# Patient Record
Sex: Female | Born: 1960 | Race: White | Hispanic: No | Smoking: Never smoker
Health system: Southern US, Community
[De-identification: ages and names within clinical notes are randomized; demographics above are authoritative.]

## PROBLEM LIST (undated history)

## (undated) DIAGNOSIS — E785 Hyperlipidemia, unspecified: Secondary | ICD-10-CM

## (undated) DIAGNOSIS — G4730 Sleep apnea, unspecified: Secondary | ICD-10-CM

## (undated) DIAGNOSIS — N870 Mild cervical dysplasia: Secondary | ICD-10-CM

## (undated) DIAGNOSIS — S92902A Unspecified fracture of left foot, initial encounter for closed fracture: Secondary | ICD-10-CM

## (undated) DIAGNOSIS — B977 Papillomavirus as the cause of diseases classified elsewhere: Secondary | ICD-10-CM

## (undated) DIAGNOSIS — I10 Essential (primary) hypertension: Secondary | ICD-10-CM

## (undated) HISTORY — DX: Hyperlipidemia, unspecified: E78.5

## (undated) HISTORY — DX: Essential (primary) hypertension: I10

## (undated) HISTORY — PX: TONSILLECTOMY: SUR1361

## (undated) HISTORY — DX: Unspecified fracture of left foot, initial encounter for closed fracture: S92.902A

## (undated) HISTORY — DX: Sleep apnea, unspecified: G47.30

## (undated) HISTORY — DX: Mild cervical dysplasia: N87.0

## (undated) HISTORY — PX: OTHER SURGICAL HISTORY: SHX169

## (undated) HISTORY — DX: Papillomavirus as the cause of diseases classified elsewhere: B97.7

## (undated) HISTORY — PX: BREAST BIOPSY: SHX20

---

## 2003-05-30 ENCOUNTER — Ambulatory Visit (HOSPITAL_BASED_OUTPATIENT_CLINIC_OR_DEPARTMENT_OTHER): Admission: RE | Admit: 2003-05-30 | Discharge: 2003-05-30 | Payer: Self-pay | Admitting: Orthopedic Surgery

## 2004-09-18 ENCOUNTER — Ambulatory Visit: Payer: Self-pay | Admitting: Unknown Physician Specialty

## 2006-01-07 ENCOUNTER — Ambulatory Visit: Payer: Self-pay | Admitting: Unknown Physician Specialty

## 2006-01-07 IMAGING — MG UNKNOWN MG STUDY
1 series · 4 of 4 positions shown · non-contrast
Comparison: none

REASON FOR EXAM: screening mammo
COMMENTS:

[R CC · right · 4 of 4 slices shown]
[im 1/4]
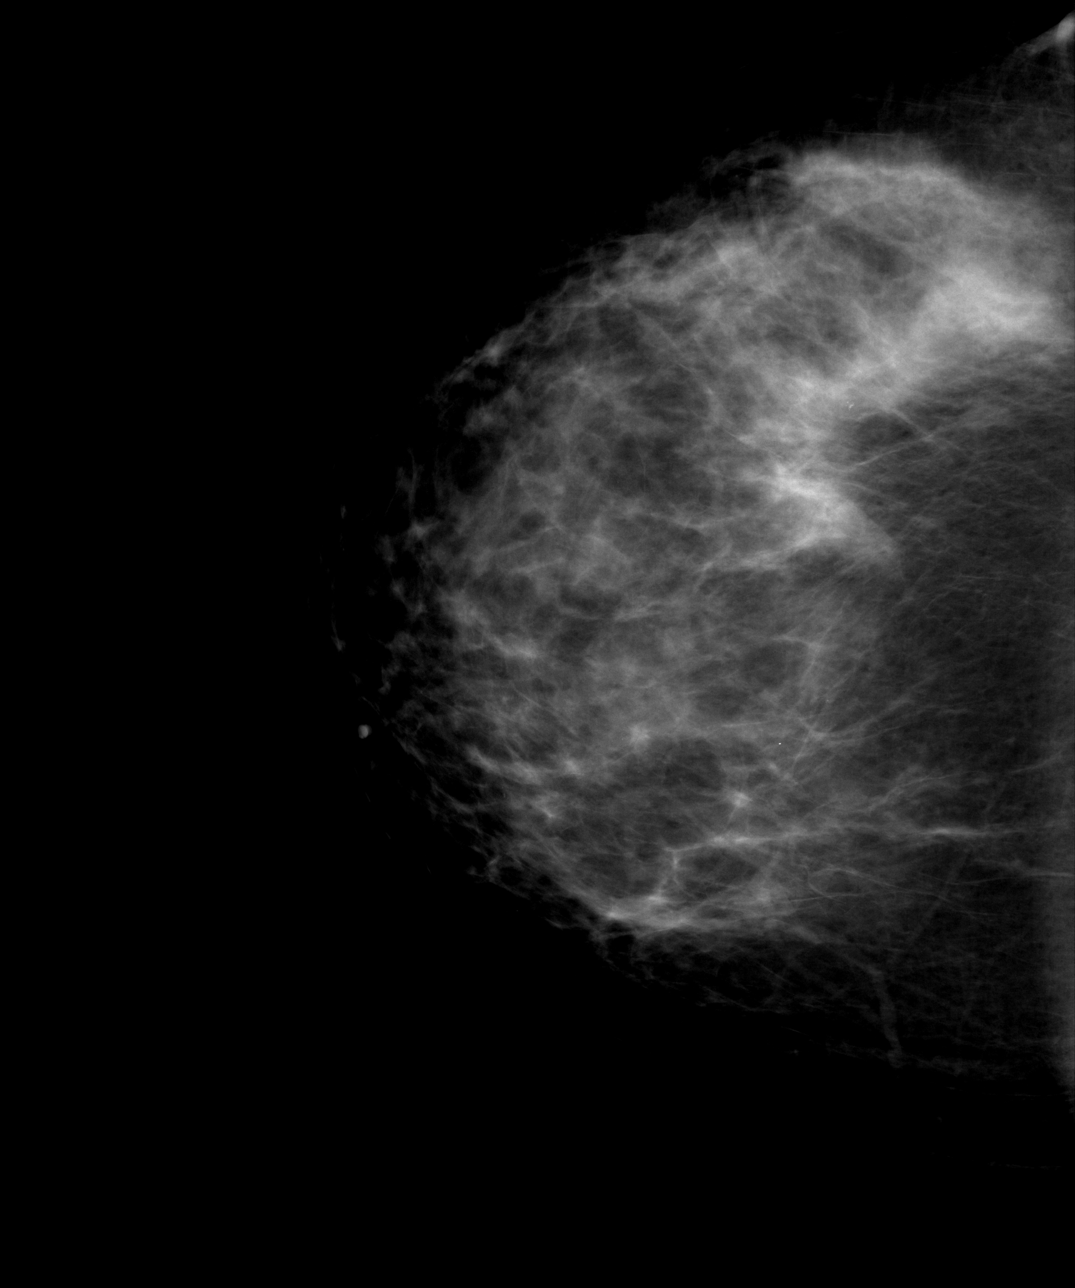
[im 2/4]
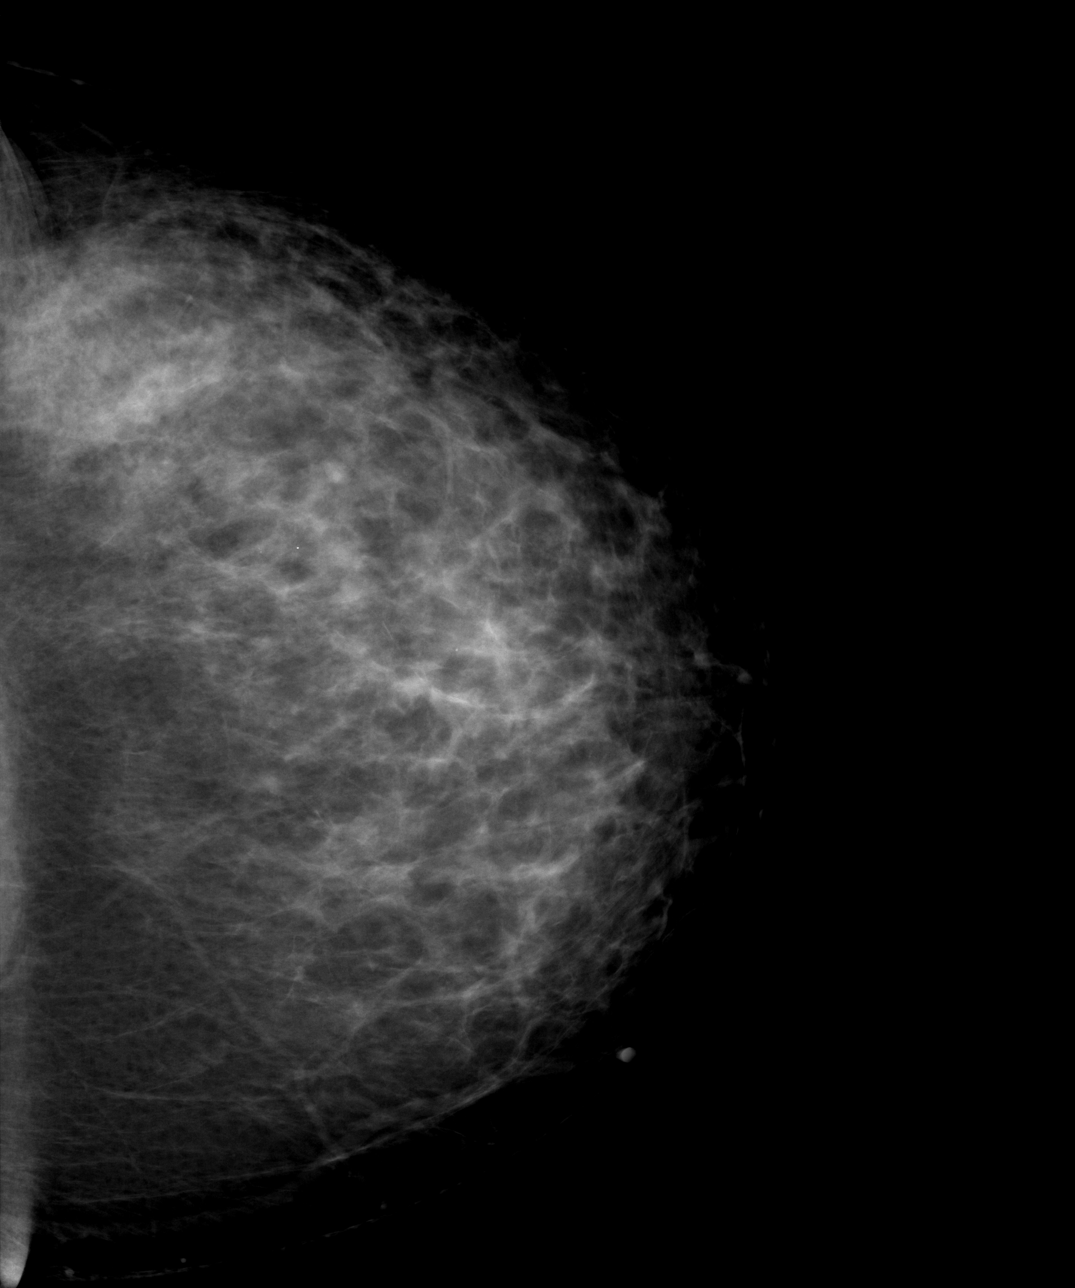
[im 3/4]
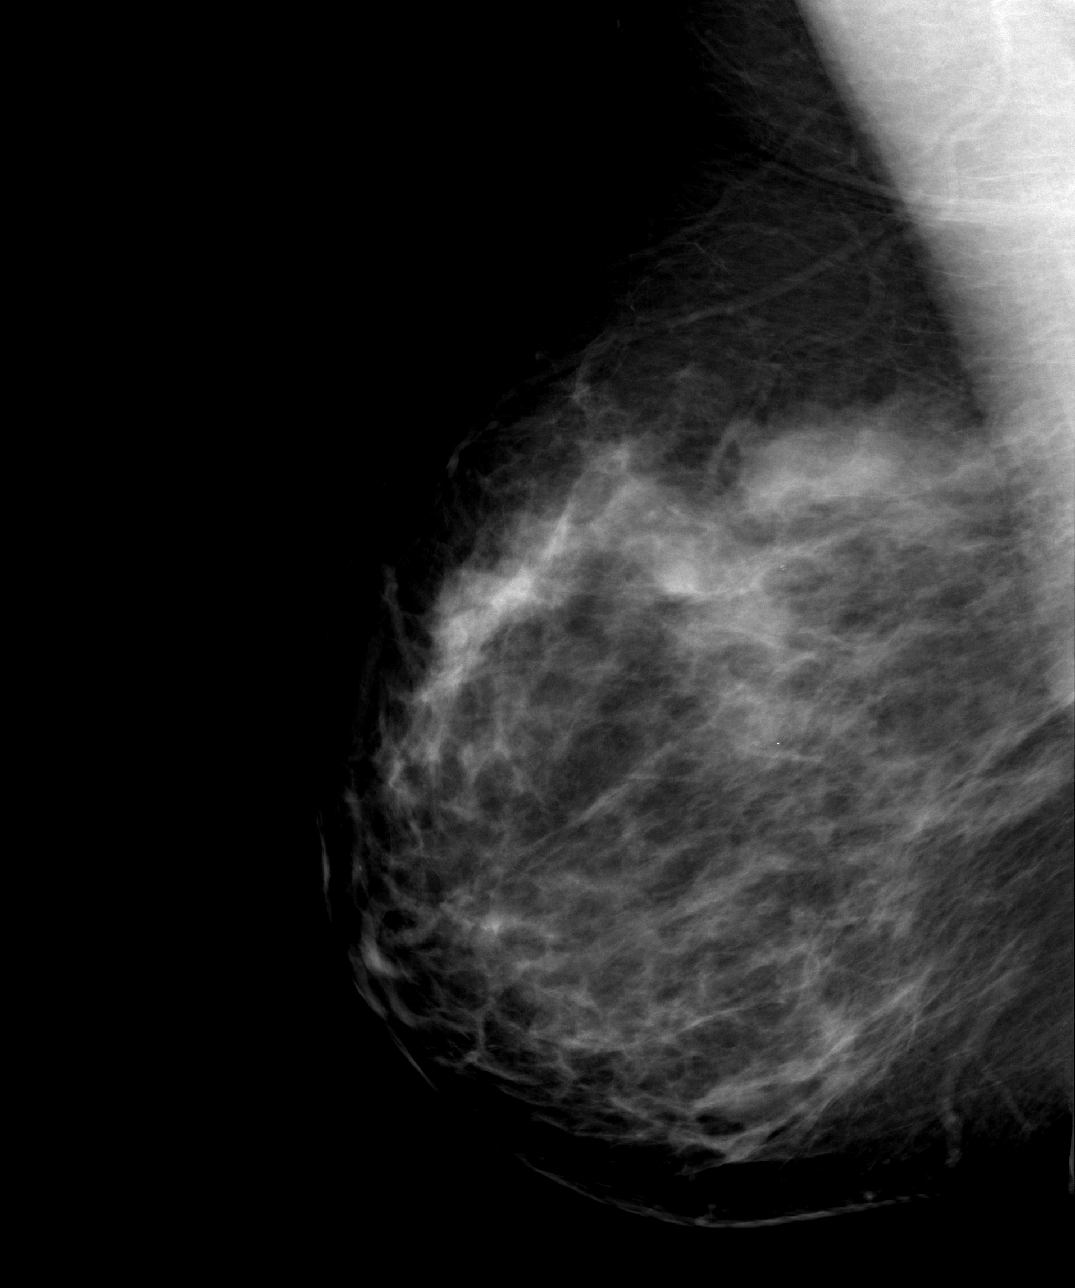
[im 4/4]
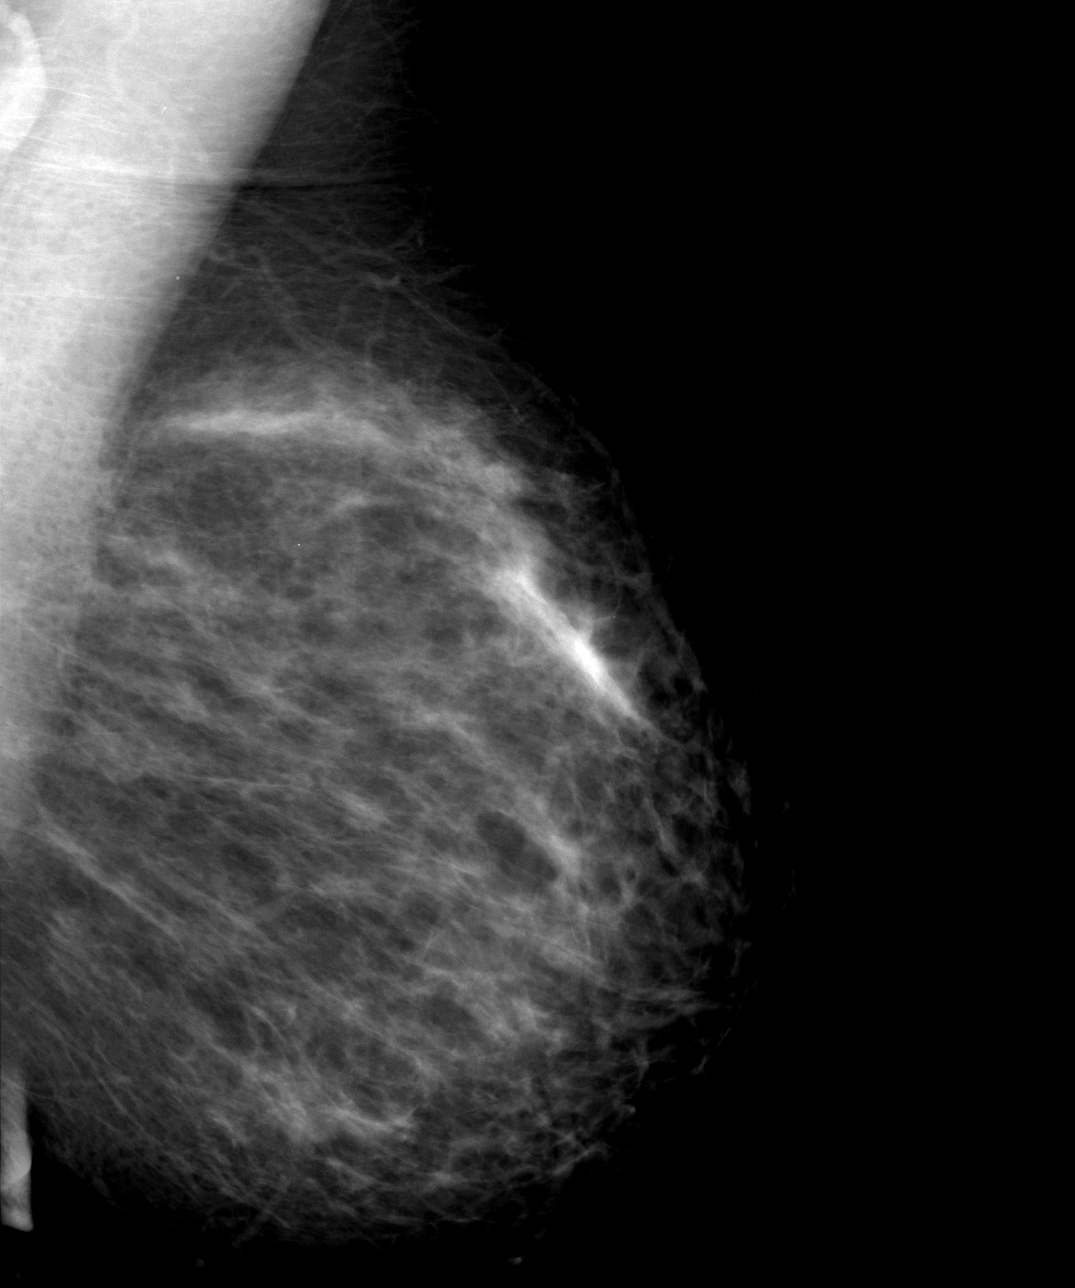

[4 of 4 positions shown; findings below may reference images not displayed]

PROCEDURE:     MAM - MAM DGTL SCREENING MAMMO W/CAD  - [DATE]  [DATE]

RESULT:     Routine imaging was obtained and compared with prior film
screening of [DATE] and [DATE].  The breasts are moderately dense and
nodular with no dominant mass seen. No suspicious microcalcifications are
noted. No areas of architectural distortion or developing densities.
IMPRESSION: 1)Nodular breasts which appear basically unchanged from prior studies.
Continued annual follow-up is recommended.

BI-RADS: Category 2-Benign Finding

A NEGATIVE MAMMOGRAM REPORT DOES NOT PRECLUDE BIOPSY OR OTHER EVALUATION OF
A CLINICALLY PALPABLE OR OTHERWISE SUSPICIOUS MASS OR LESION.  BREAST CANCER
MAY NOT BE DETECTED BY MAMMOGRAPHY IN UP TO 10% OF CASES.

## 2007-08-04 ENCOUNTER — Ambulatory Visit: Payer: Self-pay | Admitting: Unknown Physician Specialty

## 2007-08-04 IMAGING — MG MAM DGTL SCREENING MAMMO W/CAD
1 series · 5 of 5 positions shown · non-contrast
Comparison: none

REASON FOR EXAM: screening mammo
COMMENTS:

[Series 3858: R CC · right · 5 of 5 slices shown]
[im 1/5]
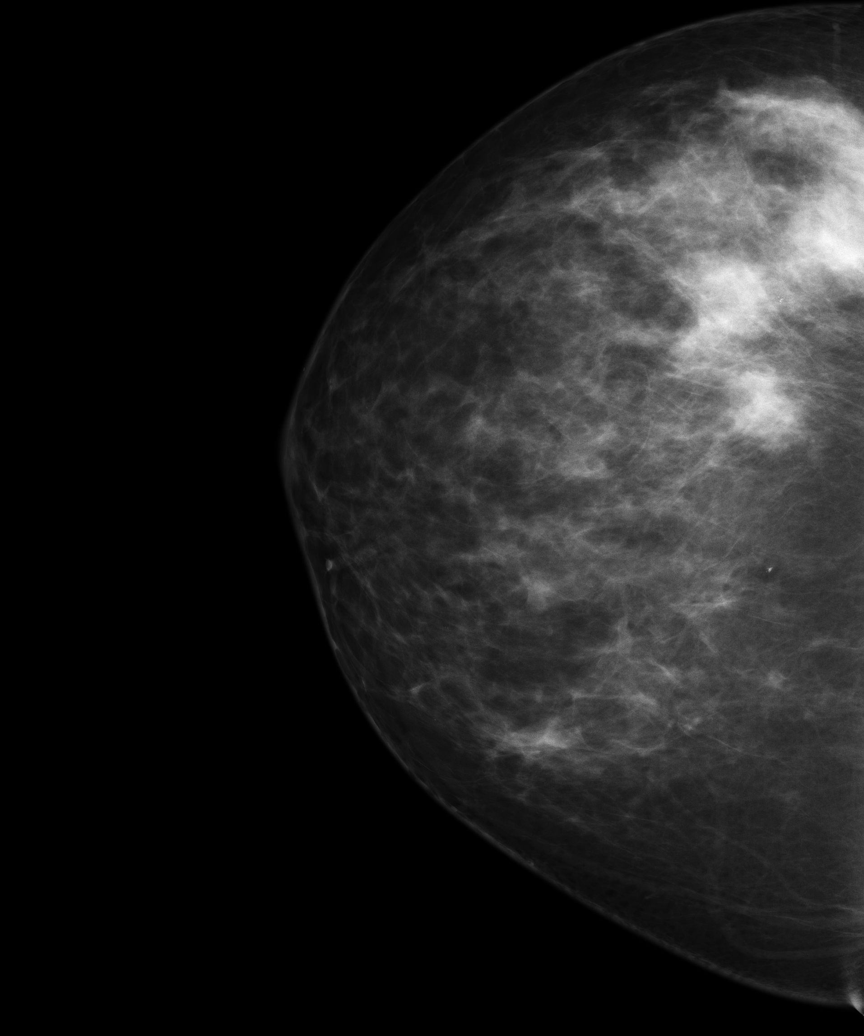
[im 2/5]
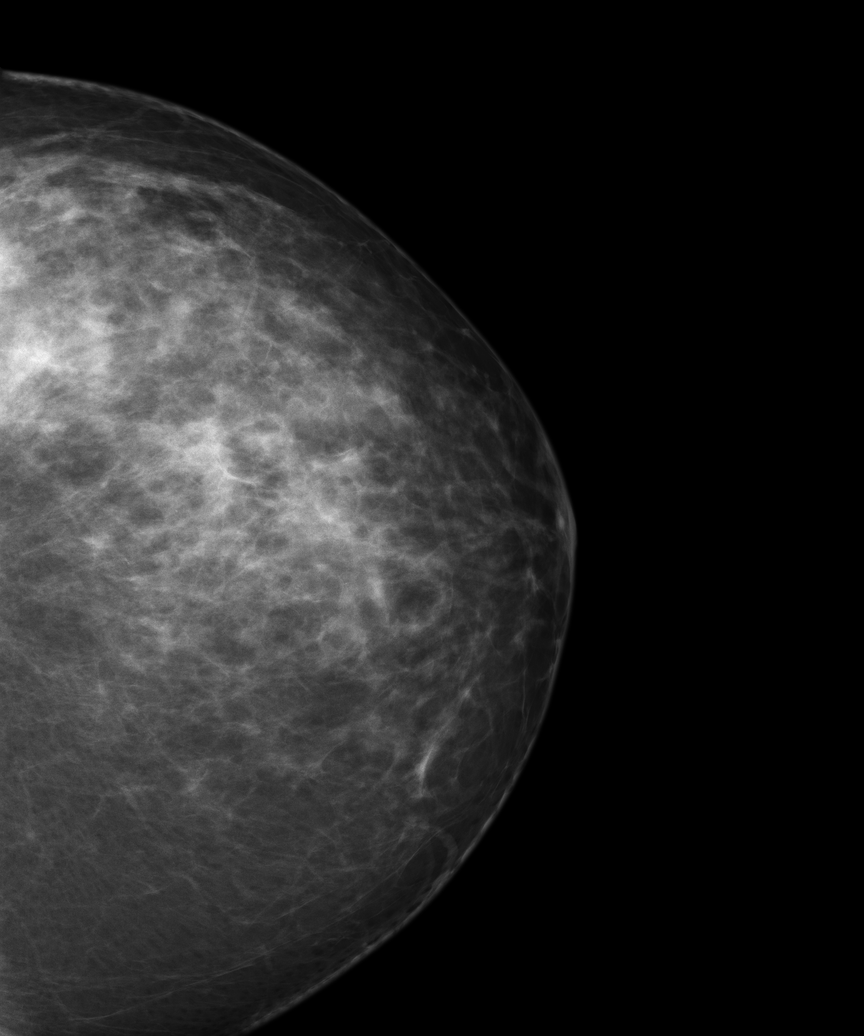
[im 3/5]
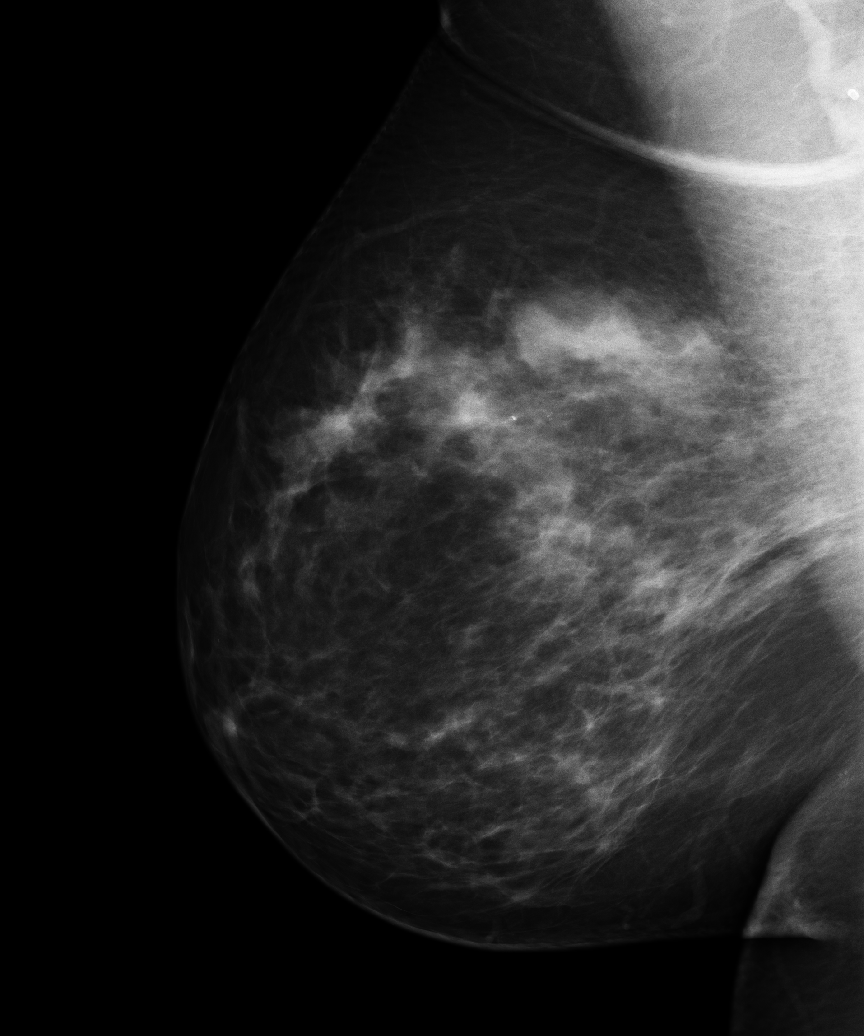
[im 4/5]
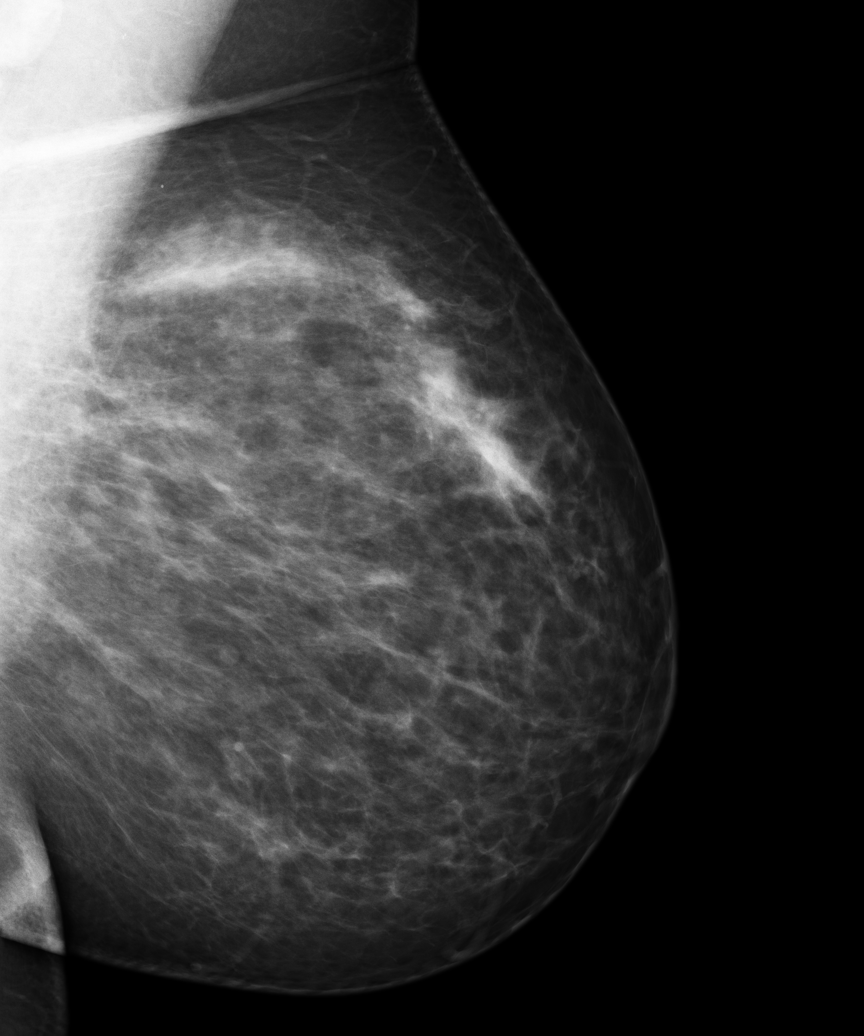
[im 5/5]
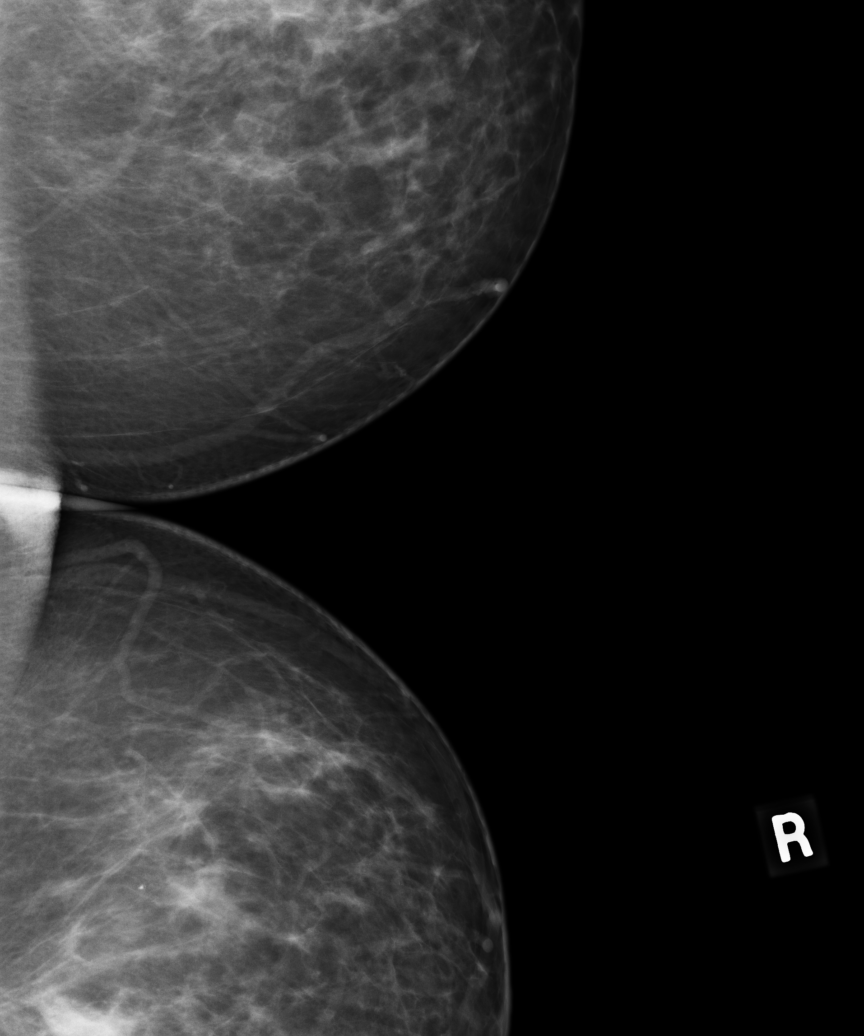

[5 of 5 positions shown; findings below may reference images not displayed]

PROCEDURE:     MAM - MAM DGTL SCREENING MAMMO W/CAD  - [DATE]  [DATE]

RESULT:     Comparison is made to prior digital study of [DATE] as well
as to a film screen study of [DATE].
There is a stable, dominant density noted on the RIGHT in the upper outer
quadrant.  There is no dominant mass noted elsewhere.  There are no
malignant-appearing groupings of microcalcification.  No area of new
architectural distortion is seen.
IMPRESSION: I do not see findings suspicious for malignancy.

BI-RADS:  Category 2  Benign Finding.

RECOMMENDATION:  Please continue to encourage yearly mammographic follow up.

A NEGATIVE MAMMOGRAM REPORT DOES NOT PRECLUDE BIOPSY OR OTHER EVALUATION OF
A CLINICALLY PALPABLE OR OTHERWISE SUSPICIOUS MASS OR LESION.  BREAST CANCER
MAY NOT BE DETECTED BY MAMMOGRAPHY IN UP TO 10% OF CASES.

## 2009-07-09 ENCOUNTER — Ambulatory Visit: Payer: Self-pay | Admitting: Unknown Physician Specialty

## 2011-07-27 ENCOUNTER — Ambulatory Visit: Payer: Self-pay | Admitting: Unknown Physician Specialty

## 2011-07-27 IMAGING — MG MM DIGITAL SCREENING BILAT W/ CAD
1 series · 5 of 5 positions shown · non-contrast
Comparison: none

REASON FOR EXAM: DON LOLITO scr mammo no order
COMMENTS:

[R CC · right · 5 of 5 slices shown]
[im 1/5]
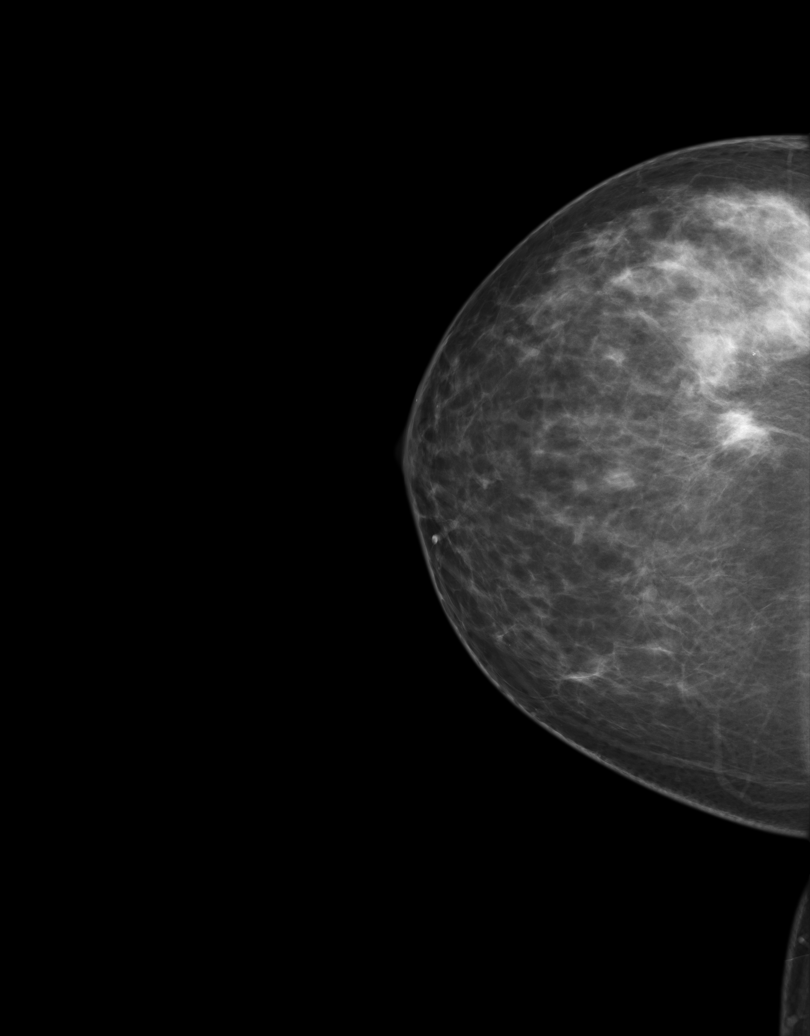
[im 2/5]
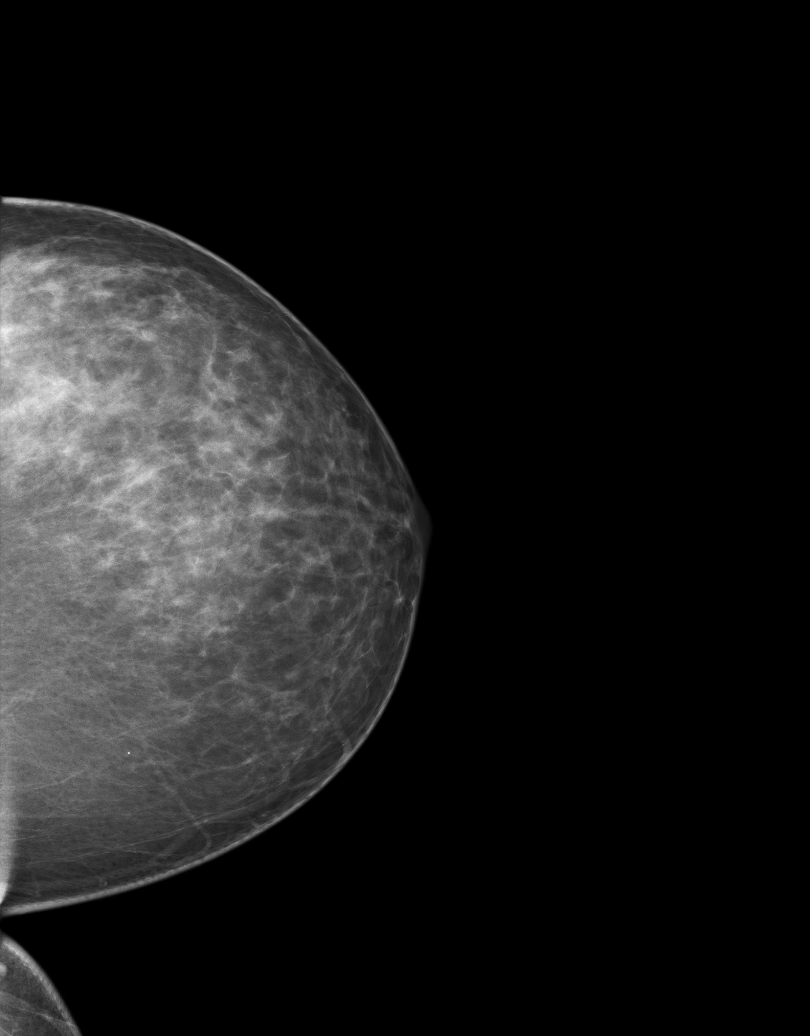
[im 3/5]
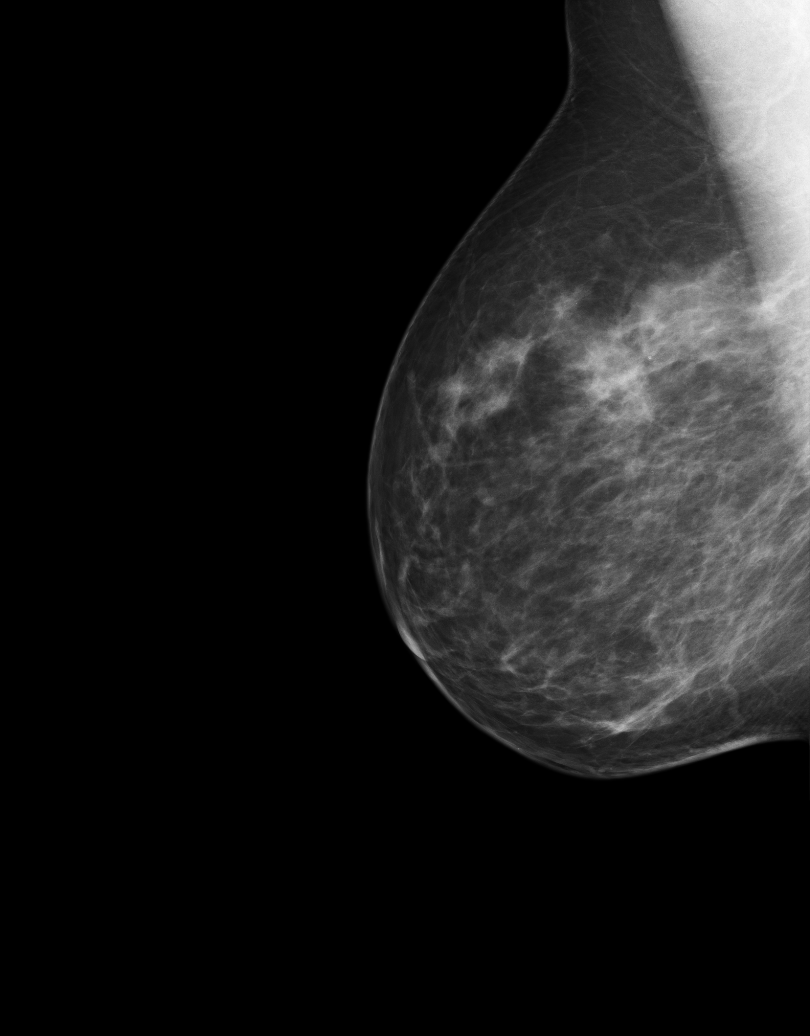
[im 4/5]
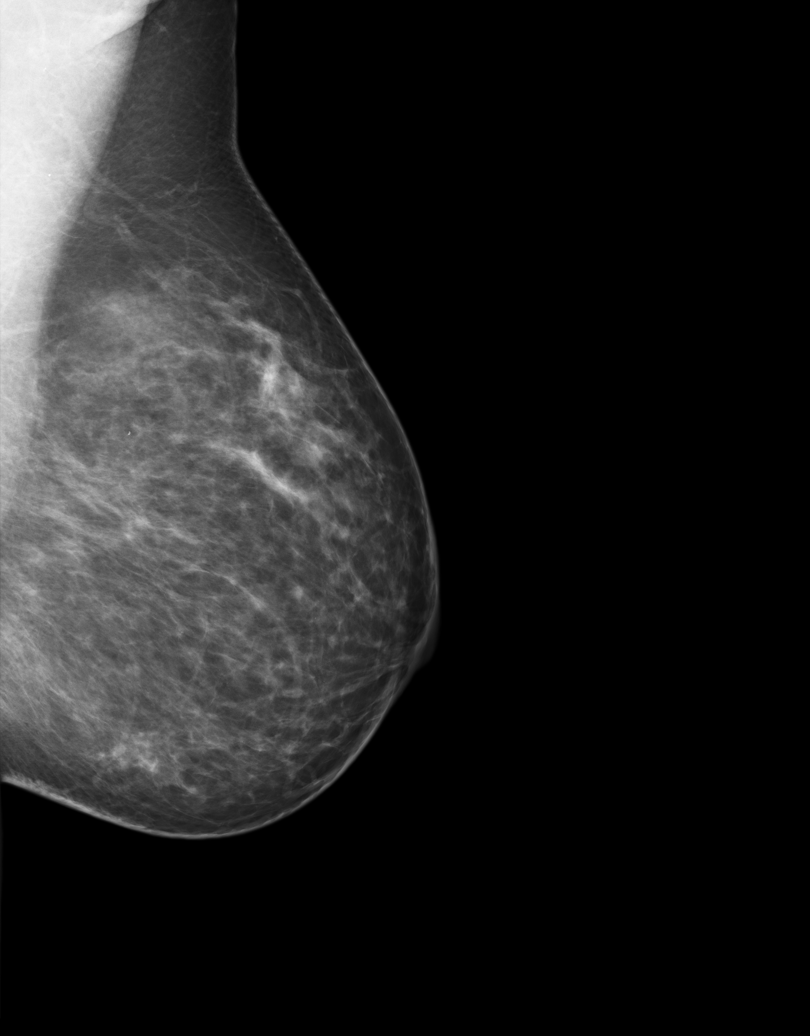
[im 5/5]
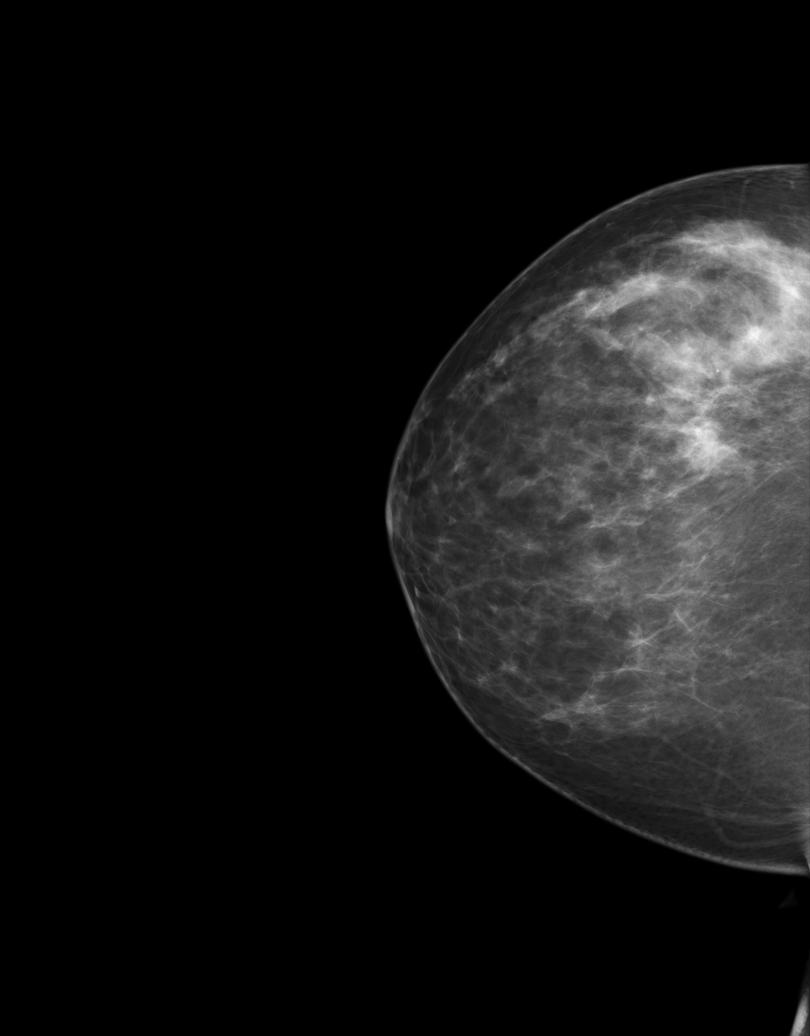

[5 of 5 positions shown; findings below may reference images not displayed]

PROCEDURE:     MAM - MAM DON LOLITO DGT SCR W/CAD NO ORDER  - [DATE]  [DATE]

RESULT:     There is a family history of breast cancer in the patient's
paternal grandmother and maternal aunt. Comparison is made to analog images
dated [DATE], and to digital images [DATE], as well as
[DATE].  The breasts exhibit a moderately dense, asymmetric,
heterogeneous parenchymal pattern with a greater density of parenchymal
structures in the upper outer right breast than the left on the CC
projection and MLO projection without a developing density or malignant
calcification.
IMPRESSION: 1.Stable, benign appearing bilateral mammogram.

BI-RADS: Category 2 - Benign Findings

RECOMMENDATIONS:

1.     Please continue to encourage yearly mammographic follow-up.

A NEGATIVE MAMMOGRAM REPORT DOES NOT PRECLUDE BIOPSY OR OTHER EVALUATION OF
A CLINICALLY PALPABLE OR OTHERWISE SUSPICIOUS MASS OR LESION. BREAST CANCER
MAY NOT BE DETECTED BY MAMMOGRAPHY IN UP TO 10% OF CASES.

## 2012-07-31 DIAGNOSIS — N870 Mild cervical dysplasia: Secondary | ICD-10-CM

## 2012-07-31 HISTORY — DX: Mild cervical dysplasia: N87.0

## 2015-02-05 ENCOUNTER — Encounter: Payer: Self-pay | Admitting: Unknown Physician Specialty

## 2015-02-05 ENCOUNTER — Ambulatory Visit (INDEPENDENT_AMBULATORY_CARE_PROVIDER_SITE_OTHER): Payer: No Typology Code available for payment source | Admitting: Unknown Physician Specialty

## 2015-02-05 VITALS — BP 120/83 | HR 69 | Temp 98.5°F | Ht 63.0 in | Wt 198.8 lb

## 2015-02-05 DIAGNOSIS — R300 Dysuria: Secondary | ICD-10-CM | POA: Diagnosis not present

## 2015-02-05 DIAGNOSIS — N390 Urinary tract infection, site not specified: Secondary | ICD-10-CM | POA: Insufficient documentation

## 2015-02-05 DIAGNOSIS — N3000 Acute cystitis without hematuria: Secondary | ICD-10-CM | POA: Diagnosis not present

## 2015-02-05 LAB — MICROSCOPIC EXAMINATION

## 2015-02-05 MED ORDER — SULFAMETHOXAZOLE-TRIMETHOPRIM 800-160 MG PO TABS: 1.0000 | ORAL_TABLET | Freq: Two times a day (BID) | ORAL | Status: DC

## 2015-02-05 NOTE — Addendum Note (Signed)
Addended by: Gabriel CirriWICKER, Eryk Beavers on: 02/05/2015 04:53 PM   Modules accepted: Kipp BroodSmartSet

## 2015-02-05 NOTE — Progress Notes (Signed)
   BP 120/83 mmHg  Pulse 69  Temp(Src) 98.5 F (36.9 C) (Oral)  Ht 5\' 3"  (1.6 m)  Wt 198 lb 12.8 oz (90.175 kg)  BMI 35.22 kg/m2  SpO2 97%  LMP 01/14/2015   Subjective:    Patient ID: Stacie Bell, female    DOB: 1960/12/28, 54 y.o.   MRN: [id number]  HPI: Stacie Bell is a 54 y.o. female presenting on 02/05/2015 for Urinary Tract Infection and Back Pain  Dysuria for 3 weeks.  States she is having some fullness, a little discharge, itching.  States low back pain across the lower back.  Took Azo which helped.       Relevant past medical, surgical, family and social history reviewed and updated as indicated. Interim medical history since our last visit reviewed. Allergies and medications reviewed and updated.  Review of Systems  Genitourinary: Positive for frequency and vaginal discharge. Negative for dysuria, urgency, hematuria, flank pain, decreased urine volume, enuresis, difficulty urinating, genital sores, pelvic pain and dyspareunia.    Per HPI unless specifically indicated above     Objective:    BP 120/83 mmHg  Pulse 69  Temp(Src) 98.5 F (36.9 C) (Oral)  Ht 5\' 3"  (1.6 m)  Wt 198 lb 12.8 oz (90.175 kg)  BMI 35.22 kg/m2  SpO2 97%  LMP 01/14/2015  Wt Readings from Last 3 Encounters:  02/05/15 198 lb 12.8 oz (90.175 kg)  09/28/14 200 lb (90.719 kg)    Physical Exam  Constitutional: She is oriented to person, place, and time. She appears well-developed and well-nourished. No distress.  HENT:  Head: Normocephalic and atraumatic.  Eyes: Conjunctivae and lids are normal. Right eye exhibits no discharge. Left eye exhibits no discharge. No scleral icterus.  Cardiovascular: Normal rate and regular rhythm.   Pulmonary/Chest: Effort normal and breath sounds normal. No accessory muscle usage. No respiratory distress.  Abdominal: Soft. Normal appearance and bowel sounds are normal. She exhibits no distension. There is no splenomegaly or hepatomegaly. There is no  tenderness.  Musculoskeletal: Normal range of motion.  Neurological: She is alert and oriented to person, place, and time.  Skin: Skin is intact. No rash noted. No pallor.  Psychiatric: She has a normal mood and affect. Her behavior is normal. Judgment and thought content normal.       Assessment & Plan:   UTI Rx for Septra to take BID for 3 days with refill.    Follow up plan: No Follow-up on file.

## 2015-02-05 NOTE — Patient Instructions (Signed)

## 2015-02-07 LAB — UA/M W/RFLX CULTURE, ROUTINE: ORGANISM ID, BACTERIA: NO GROWTH

## 2015-10-24 ENCOUNTER — Ambulatory Visit (INDEPENDENT_AMBULATORY_CARE_PROVIDER_SITE_OTHER): Payer: Self-pay | Admitting: Family Medicine

## 2015-10-24 ENCOUNTER — Encounter: Payer: Self-pay | Admitting: Family Medicine

## 2015-10-24 VITALS — BP 122/81 | HR 70 | Temp 98.3°F | Ht 63.2 in | Wt 192.0 lb

## 2015-10-24 DIAGNOSIS — R030 Elevated blood-pressure reading, without diagnosis of hypertension: Secondary | ICD-10-CM

## 2015-10-24 DIAGNOSIS — IMO0001 Reserved for inherently not codable concepts without codable children: Secondary | ICD-10-CM

## 2015-10-24 DIAGNOSIS — J069 Acute upper respiratory infection, unspecified: Secondary | ICD-10-CM

## 2015-10-24 MED ORDER — PREDNISONE 50 MG PO TABS: 50.0000 mg | ORAL_TABLET | Freq: Every day | ORAL | Status: DC

## 2015-10-24 NOTE — Progress Notes (Signed)
BP 122/81 mmHg  Pulse 70  Temp(Src) 98.3 F (36.8 C)  Ht 5' 3.2" (1.605 m)  Wt 192 lb (87.091 kg)  BMI 33.81 kg/m2  SpO2 96%  LMP  (LMP Unknown)   Subjective:    Patient ID: Stacie Bell, female    DOB: [birth year], 55 y.o.   MRN: [id number]  HPI: Stacie Bell is a 55 y.o. female  Chief Complaint  Patient presents with  . Hypertension    Patient states that her BP has been going up and down, it mainly elevates in the evening   ELEVATED BLOOD PRESSURE Duration of elevated BP: 2 days BP monitoring frequency: a few times a day BP range: 142/96 is the highest Previous BP meds: yes,  lisinopril Recent stressors: yes Family history of hypertension: yes Recurrent headaches: no Visual changes: no Palpitations: no  Dyspnea: no Chest pain: no Lower extremity edema: no Dizzy/lightheaded: no Transient ischemic attacks: no  UPPER RESPIRATORY TRACT INFECTION Duration: 2 days Worst symptom: fatigue Fever: yes Cough: no Shortness of breath: no Wheezing: no Chest pain: no Chest tightness: no Chest congestion: no Nasal congestion: yes Runny nose: no Post nasal drip: no Sneezing: yes Sore throat: yes Swollen glands: no Sinus pressure: no Headache: yes Face pain: no Toothache: no Ear pain: no  Ear pressure: no  Eyes red/itching:no Eye drainage/crusting: no  Vomiting: no, + nausea Rash: no Fatigue: yes Sick contacts: yes Strep contacts: no  Context: fluctuating Recurrent sinusitis: no Relief with OTC cold/cough medications: no  Treatments attempted: ibuprofen   Relevant past medical, surgical, family and social history reviewed and updated as indicated. Interim medical history since our last visit reviewed. Allergies and medications reviewed and updated.  Review of Systems  Constitutional: Negative.   HENT: Negative.   Respiratory: Negative.   Cardiovascular: Negative.   Psychiatric/Behavioral: Negative.     Per HPI unless specifically indicated  above     Objective:    BP 122/81 mmHg  Pulse 70  Temp(Src) 98.3 F (36.8 C)  Ht 5' 3.2" (1.605 m)  Wt 192 lb (87.091 kg)  BMI 33.81 kg/m2  SpO2 96%  LMP  (LMP Unknown)  Wt Readings from Last 3 Encounters:  10/24/15 192 lb (87.091 kg)  02/05/15 198 lb 12.8 oz (90.175 kg)  09/28/14 200 lb (90.719 kg)    Physical Exam  Constitutional: She is oriented to person, place, and time. She appears well-developed and well-nourished. No distress.  HENT:  Head: Normocephalic and atraumatic.  Right Ear: Hearing, tympanic membrane, external ear and ear canal normal.  Left Ear: Hearing, tympanic membrane, external ear and ear canal normal.  Nose: Mucosal edema and rhinorrhea present. No nose lacerations, sinus tenderness, nasal deformity, septal deviation or nasal septal hematoma. No epistaxis.  No foreign bodies.  Mouth/Throat: Uvula is midline, oropharynx is clear and moist and mucous membranes are normal. No oropharyngeal exudate.  Eyes: Conjunctivae, EOM and lids are normal. Pupils are equal, round, and reactive to light. Right eye exhibits no discharge. Left eye exhibits no discharge. No scleral icterus.  Neck: Neck supple.  Cardiovascular: Normal rate, regular rhythm, normal heart sounds and intact distal pulses.  Exam reveals no gallop and no friction rub.   No murmur heard. Pulmonary/Chest: Effort normal and breath sounds normal. No respiratory distress. She has no wheezes. She has no rales. She exhibits no tenderness.  Musculoskeletal: Normal range of motion.  Neurological: She is alert and oriented to person, place, and time.  Skin: Skin is warm,  dry and intact. No rash noted. She is not diaphoretic. No erythema. No pallor.  Psychiatric: She has a normal mood and affect. Her speech is normal and behavior is normal. Judgment and thought content normal. Cognition and memory are normal.  Nursing note and vitals reviewed.   Results for orders placed or performed in visit on 02/05/15   Microscopic Examination  Result Value Ref Range   WBC, UA 6-10 (A) 0 -  5 /hpf   RBC, UA 0-2 0 -  2 /hpf   Epithelial Cells (non renal) 0-10 0 - 10 /hpf   Mucus, UA Present Not Estab.   Bacteria, UA Few None seen/Few  UA/M w/rflx Culture, Routine  Result Value Ref Range   Urine Culture, Routine Final report    Urine Culture result 1 No growth       Assessment & Plan:   Problem List Items Addressed This Visit    None    Visit Diagnoses    Upper respiratory infection    -  Primary    Will treat congestion with prednisone. Continue to monitor. Call with any concerns. No sign of bacterial infection.    Elevated BP        Normal today. Work on [employer]. Monitor salt. Recheck at follow up with Elnita Maxwell        Follow up plan: Return ASAP for annual follow up with [facility].

## 2015-10-24 NOTE — Patient Instructions (Signed)
DASH Eating Plan  DASH stands for "Dietary Approaches to Stop Hypertension." The DASH eating plan is a healthy eating plan that has been shown to reduce high blood pressure (hypertension). Additional health benefits may include reducing the risk of type 2 diabetes mellitus, heart disease, and stroke. The DASH eating plan may also help with weight loss.  WHAT DO I NEED TO KNOW ABOUT THE DASH EATING PLAN?  For the DASH eating plan, you will follow these general guidelines:  · Choose foods with a percent daily value for sodium of less than 5% (as listed on the food label).  · Use salt-free seasonings or herbs instead of table salt or sea salt.  · Check with your health care provider or pharmacist before using salt substitutes.  · Eat lower-sodium products, often labeled as "lower sodium" or "no salt added."  · Eat fresh foods.  · Eat more vegetables, fruits, and low-fat dairy products.  · Choose whole grains. Look for the word "whole" as the first word in the ingredient list.  · Choose fish and skinless chicken or turkey more often than red meat. Limit fish, poultry, and meat to 6 oz (170 g) each day.  · Limit sweets, desserts, sugars, and sugary drinks.  · Choose heart-healthy fats.  · Limit cheese to 1 oz (28 g) per day.  · Eat more home-cooked food and less restaurant, buffet, and fast food.  · Limit fried foods.  · Cook foods using methods other than frying.  · Limit canned vegetables. If you do use them, rinse them well to decrease the sodium.  · When eating at a restaurant, ask that your food be prepared with less salt, or no salt if possible.  WHAT FOODS CAN I EAT?  Seek help from a dietitian for individual calorie needs.  Grains  Whole grain or whole wheat bread. Brown rice. Whole grain or whole wheat pasta. Quinoa, bulgur, and whole grain cereals. Low-sodium cereals. Corn or whole wheat flour tortillas. Whole grain cornbread. Whole grain crackers. Low-sodium crackers.  Vegetables  Fresh or frozen vegetables  (raw, steamed, roasted, or grilled). Low-sodium or reduced-sodium tomato and vegetable juices. Low-sodium or reduced-sodium tomato sauce and paste. Low-sodium or reduced-sodium canned vegetables.   Fruits  All fresh, canned (in natural juice), or frozen fruits.  Meat and Other Protein Products  Ground beef (85% or leaner), grass-fed beef, or beef trimmed of fat. Skinless chicken or turkey. Ground chicken or turkey. Pork trimmed of fat. All fish and seafood. Eggs. Dried beans, peas, or lentils. Unsalted nuts and seeds. Unsalted canned beans.  Dairy  Low-fat dairy products, such as skim or 1% milk, 2% or reduced-fat cheeses, low-fat ricotta or cottage cheese, or plain low-fat yogurt. Low-sodium or reduced-sodium cheeses.  Fats and Oils  Tub margarines without trans fats. Light or reduced-fat mayonnaise and salad dressings (reduced sodium). Avocado. Safflower, olive, or canola oils. Natural peanut or almond butter.  Other  Unsalted popcorn and pretzels.  The items listed above may not be a complete list of recommended foods or beverages. Contact your dietitian for more options.  WHAT FOODS ARE NOT RECOMMENDED?  Grains  White bread. White pasta. White rice. Refined cornbread. Bagels and croissants. Crackers that contain trans fat.  Vegetables  Creamed or fried vegetables. Vegetables in a cheese sauce. Regular canned vegetables. Regular canned tomato sauce and paste. Regular tomato and vegetable juices.  Fruits  Dried fruits. Canned fruit in light or heavy syrup. Fruit juice.  Meat and Other Protein   Products  Fatty cuts of meat. Ribs, chicken wings, bacon, sausage, bologna, salami, chitterlings, fatback, hot dogs, bratwurst, and packaged luncheon meats. Salted nuts and seeds. Canned beans with salt.  Dairy  Whole or 2% milk, cream, half-and-half, and cream cheese. Whole-fat or sweetened yogurt. Full-fat cheeses or blue cheese. Nondairy creamers and whipped toppings. Processed cheese, cheese spreads, or cheese  curds.  Condiments  Onion and garlic salt, seasoned salt, table salt, and sea salt. Canned and packaged gravies. Worcestershire sauce. Tartar sauce. Barbecue sauce. Teriyaki sauce. Soy sauce, including reduced sodium. Steak sauce. Fish sauce. Oyster sauce. Cocktail sauce. Horseradish. Ketchup and mustard. Meat flavorings and tenderizers. Bouillon cubes. Hot sauce. Tabasco sauce. Marinades. Taco seasonings. Relishes.  Fats and Oils  Butter, stick margarine, lard, shortening, ghee, and bacon fat. Coconut, palm kernel, or palm oils. Regular salad dressings.  Other  Pickles and olives. Salted popcorn and pretzels.  The items listed above may not be a complete list of foods and beverages to avoid. Contact your dietitian for more information.  WHERE CAN I FIND MORE INFORMATION?  National Heart, Lung, and [employer] Institute: www.nhlbi.nih.gov/health/health-topics/topics/dash/     This information is not intended to replace advice given to you by your health care provider. Make sure you discuss any questions you have with your health care provider.     Document Released: 08/06/2011 Document Revised: 09/07/2014 Document Reviewed: 06/21/2013  Elsevier Interactive Patient Education ©2016 [employer]

## 2016-03-04 ENCOUNTER — Ambulatory Visit (INDEPENDENT_AMBULATORY_CARE_PROVIDER_SITE_OTHER): Payer: Managed Care, Other (non HMO) | Admitting: Unknown Physician Specialty

## 2016-03-04 ENCOUNTER — Encounter: Payer: Self-pay | Admitting: Unknown Physician Specialty

## 2016-03-04 VITALS — BP 145/93 | HR 65 | Temp 97.5°F | Ht 64.7 in | Wt 195.6 lb

## 2016-03-04 DIAGNOSIS — M545 Low back pain: Secondary | ICD-10-CM

## 2016-03-04 DIAGNOSIS — J309 Allergic rhinitis, unspecified: Secondary | ICD-10-CM

## 2016-03-04 LAB — UA/M W/RFLX CULTURE, ROUTINE
Bilirubin, UA: NEGATIVE
Glucose, UA: NEGATIVE
KETONES UA: NEGATIVE
Leukocytes, UA: NEGATIVE
NITRITE UA: NEGATIVE
PH UA: 7 (ref 5.0–7.5)
Protein, UA: NEGATIVE
RBC, UA: NEGATIVE
Specific Gravity, UA: 1.015 (ref 1.005–1.030)
UUROB: 0.2 mg/dL (ref 0.2–1.0)

## 2016-03-04 MED ORDER — FLUTICASONE PROPIONATE 50 MCG/ACT NA SUSP: 2.0000 | Freq: Every day | NASAL | Status: DC

## 2016-03-04 NOTE — Patient Instructions (Signed)
Yoga for back pain

## 2016-03-04 NOTE — Progress Notes (Signed)
BP 145/93 mmHg  Pulse 65  Temp(Src) 97.5 F (36.4 C)  Ht 5' 4.7" (1.643 m)  Wt 195 lb 9.6 oz (88.724 kg)  BMI 32.87 kg/m2  SpO2 98%  LMP  (LMP Unknown)   Subjective:    Patient ID: Stacie Bell, female    DOB: [birth year], 55 y.o.   MRN: [id number]  HPI: Stacie Bell is a 55 y.o. female  Chief Complaint  Patient presents with  . Urinary Tract Infection    pt states she has had low back pain for 2 weeks    Back Pain This is a new problem. The current episode started 1 to 4 weeks ago. Episode frequency: In the morning. The problem has been waxing and waning since onset. The quality of the pain is described as aching. The pain does not radiate. The pain is worse during the day. Stiffness is present in the morning. Pertinent negatives include no bladder incontinence, fever, leg pain, numbness, paresis or weight loss. Risk factors include obesity. She has tried analgesics for the symptoms. The treatment provided moderate relief.   Head congestions Pt has had a headach on left side of head and a little clumsy for about 1 week.    Relevant past medical, surgical, family and social history reviewed and updated as indicated. Interim medical history since our last visit reviewed. Allergies and medications reviewed and updated.  Review of Systems  Constitutional: Negative for fever and weight loss.  Genitourinary: Negative for bladder incontinence.  Musculoskeletal: Positive for back pain.  Neurological: Negative for numbness.    Per HPI unless specifically indicated above     Objective:    BP 145/93 mmHg  Pulse 65  Temp(Src) 97.5 F (36.4 C)  Ht 5' 4.7" (1.643 m)  Wt 195 lb 9.6 oz (88.724 kg)  BMI 32.87 kg/m2  SpO2 98%  LMP  (LMP Unknown)  Wt Readings from Last 3 Encounters:  03/04/16 195 lb 9.6 oz (88.724 kg)  10/24/15 192 lb (87.091 kg)  02/05/15 198 lb 12.8 oz (90.175 kg)    Physical Exam  Constitutional: She is oriented to person, place, and time. She  appears well-developed and well-nourished. No distress.  HENT:  Head: Normocephalic and atraumatic.  Right Ear: Tympanic membrane, external ear and ear canal normal.  Left Ear: Tympanic membrane, external ear and ear canal normal.  Nose: Mucosal edema present.  Mouth/Throat: Uvula is midline, oropharynx is clear and moist and mucous membranes are normal.  Eyes: Conjunctivae and lids are normal. Right eye exhibits no discharge. Left eye exhibits no discharge. No scleral icterus.  Neck: Normal range of motion. Neck supple. No JVD present. Carotid bruit is not present.  Cardiovascular: Normal rate, regular rhythm and normal heart sounds.   Pulmonary/Chest: Effort normal and breath sounds normal.  Abdominal: Normal appearance. There is no splenomegaly or hepatomegaly.  Musculoskeletal: Normal range of motion.  Neurological: She is alert and oriented to person, place, and time.  Skin: Skin is warm, dry and intact. No rash noted. No pallor.  Psychiatric: She has a normal mood and affect. Her behavior is normal. Judgment and thought content normal.    Results for orders placed or performed in visit on 02/05/15  Microscopic Examination  Result Value Ref Range   WBC, UA 6-10 (A) 0 -  5 /hpf   RBC, UA 0-2 0 -  2 /hpf   Epithelial Cells (non renal) 0-10 0 - 10 /hpf   Mucus, UA Present Not Estab.  Bacteria, UA Few None seen/Few  UA/M w/rflx Culture, Routine  Result Value Ref Range   Urine Culture, Routine Final report    Urine Culture result 1 No growth       Assessment & Plan:   Problem List Items Addressed This Visit      Unprioritized   Allergic rhinitis    Other Visit Diagnoses    Low back pain, unspecified back pain laterality, with sciatica presence unspecified    -  Primary    Discussed yoga videos and stretching.  Continue with Ibuprofen    Relevant Orders    UA/M w/rflx Culture, Routine        Follow up plan: Return if symptoms worsen or fail to improve.

## 2016-03-09 ENCOUNTER — Encounter: Payer: Self-pay | Admitting: *Deleted

## 2016-03-09 ENCOUNTER — Emergency Department: Payer: Managed Care, Other (non HMO)

## 2016-03-09 ENCOUNTER — Emergency Department
Admission: EM | Admit: 2016-03-09 | Discharge: 2016-03-09 | Disposition: A | Discharge status: Home / Self Care | Payer: Managed Care, Other (non HMO) | Attending: Emergency Medicine | Admitting: Emergency Medicine

## 2016-03-09 DIAGNOSIS — Z7951 Long term (current) use of inhaled steroids: Secondary | ICD-10-CM | POA: Diagnosis not present

## 2016-03-09 DIAGNOSIS — Z79899 Other long term (current) drug therapy: Secondary | ICD-10-CM | POA: Insufficient documentation

## 2016-03-09 DIAGNOSIS — E785 Hyperlipidemia, unspecified: Secondary | ICD-10-CM | POA: Diagnosis not present

## 2016-03-09 DIAGNOSIS — I10 Essential (primary) hypertension: Secondary | ICD-10-CM | POA: Insufficient documentation

## 2016-03-09 DIAGNOSIS — R0789 Other chest pain: Secondary | ICD-10-CM | POA: Diagnosis not present

## 2016-03-09 DIAGNOSIS — R079 Chest pain, unspecified: Secondary | ICD-10-CM

## 2016-03-09 DIAGNOSIS — Z7982 Long term (current) use of aspirin: Secondary | ICD-10-CM | POA: Insufficient documentation

## 2016-03-09 LAB — TROPONIN I: Troponin I: <0.03 ng/mL (ref <0.03)

## 2016-03-09 LAB — CBC
HCT: 42.5 % (ref 35.0–47.0)
Hemoglobin: 15 g/dL (ref 12.0–16.0)
MCH: 30 pg (ref 26.0–34.0)
MCHC: 35.2 g/dL (ref 32.0–36.0)
MCV: 85.2 fL (ref 80.0–100.0)
PLATELETS: 190 10*3/uL (ref 150–440)
RBC: 4.99 MIL/uL (ref 3.80–5.20)
RDW: 12.8 % (ref 11.5–14.5)
WBC: 8 10*3/uL (ref 3.6–11.0)

## 2016-03-09 LAB — BASIC METABOLIC PANEL
ANION GAP: 8 (ref 5–15)
BUN: 16 mg/dL (ref 6–20)
CALCIUM: 9.7 mg/dL (ref 8.9–10.3)
CHLORIDE: 108 mmol/L (ref 101–111)
CO2: 24 mmol/L (ref 22–32)
CREATININE: 0.57 mg/dL (ref 0.44–1.00)
GFR calc non Af Amer: >60 mL/min (ref >60)
Glucose, Bld: 93 mg/dL (ref 65–99)
Potassium: 3.8 mmol/L (ref 3.5–5.1)
SODIUM: 140 mmol/L (ref 135–145)

## 2016-03-09 IMAGING — CR DG CHEST 2V
1 series · 2 of 2 positions shown · non-contrast
Comparison: None.

CLINICAL DATA: Chest pressure starting [REDACTED], headache

EXAM:
CHEST  2 VIEW

[Series 1: dg chest 2 view · 0.14mm/px · 2 of 2 slices shown]
[im 1/2]
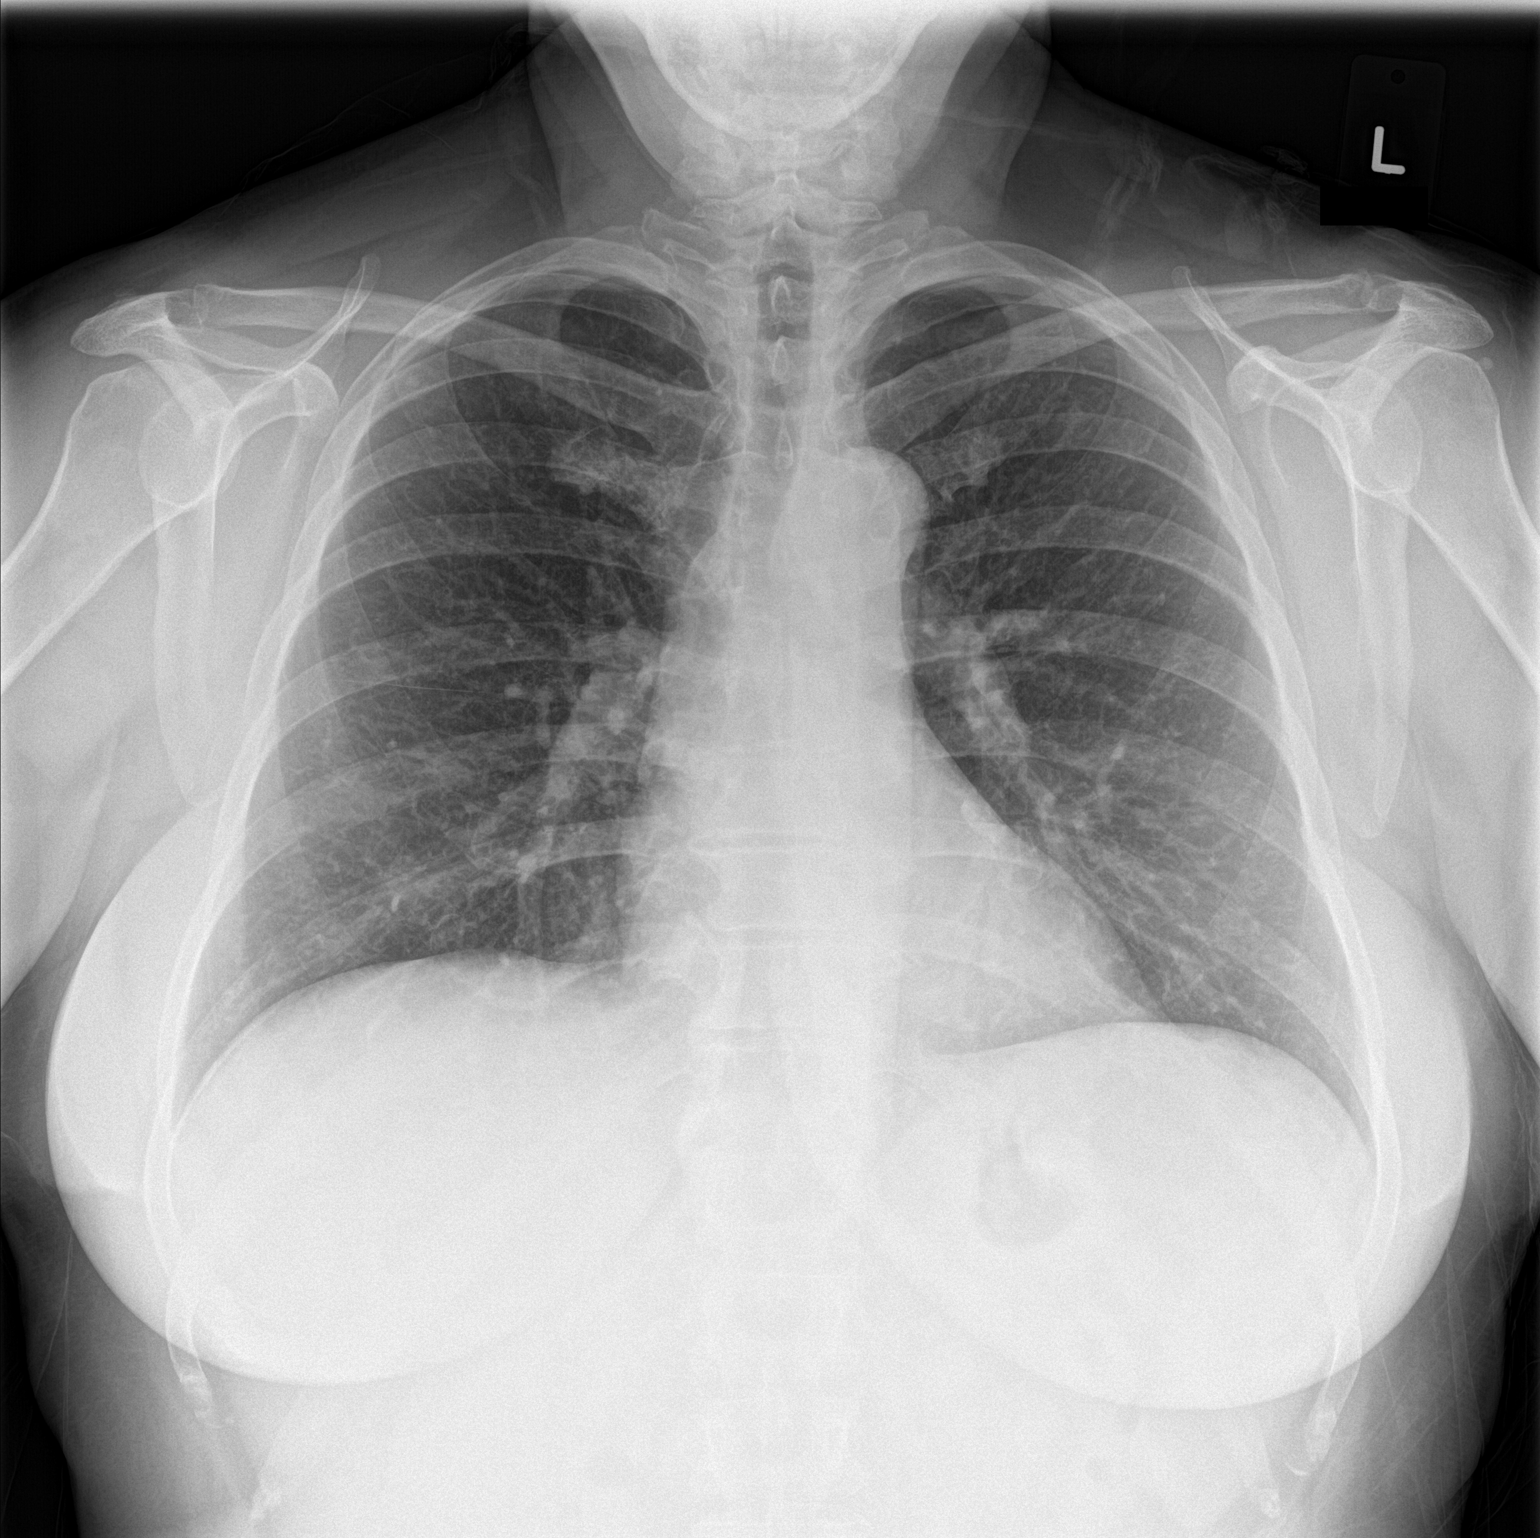
[im 2/2]
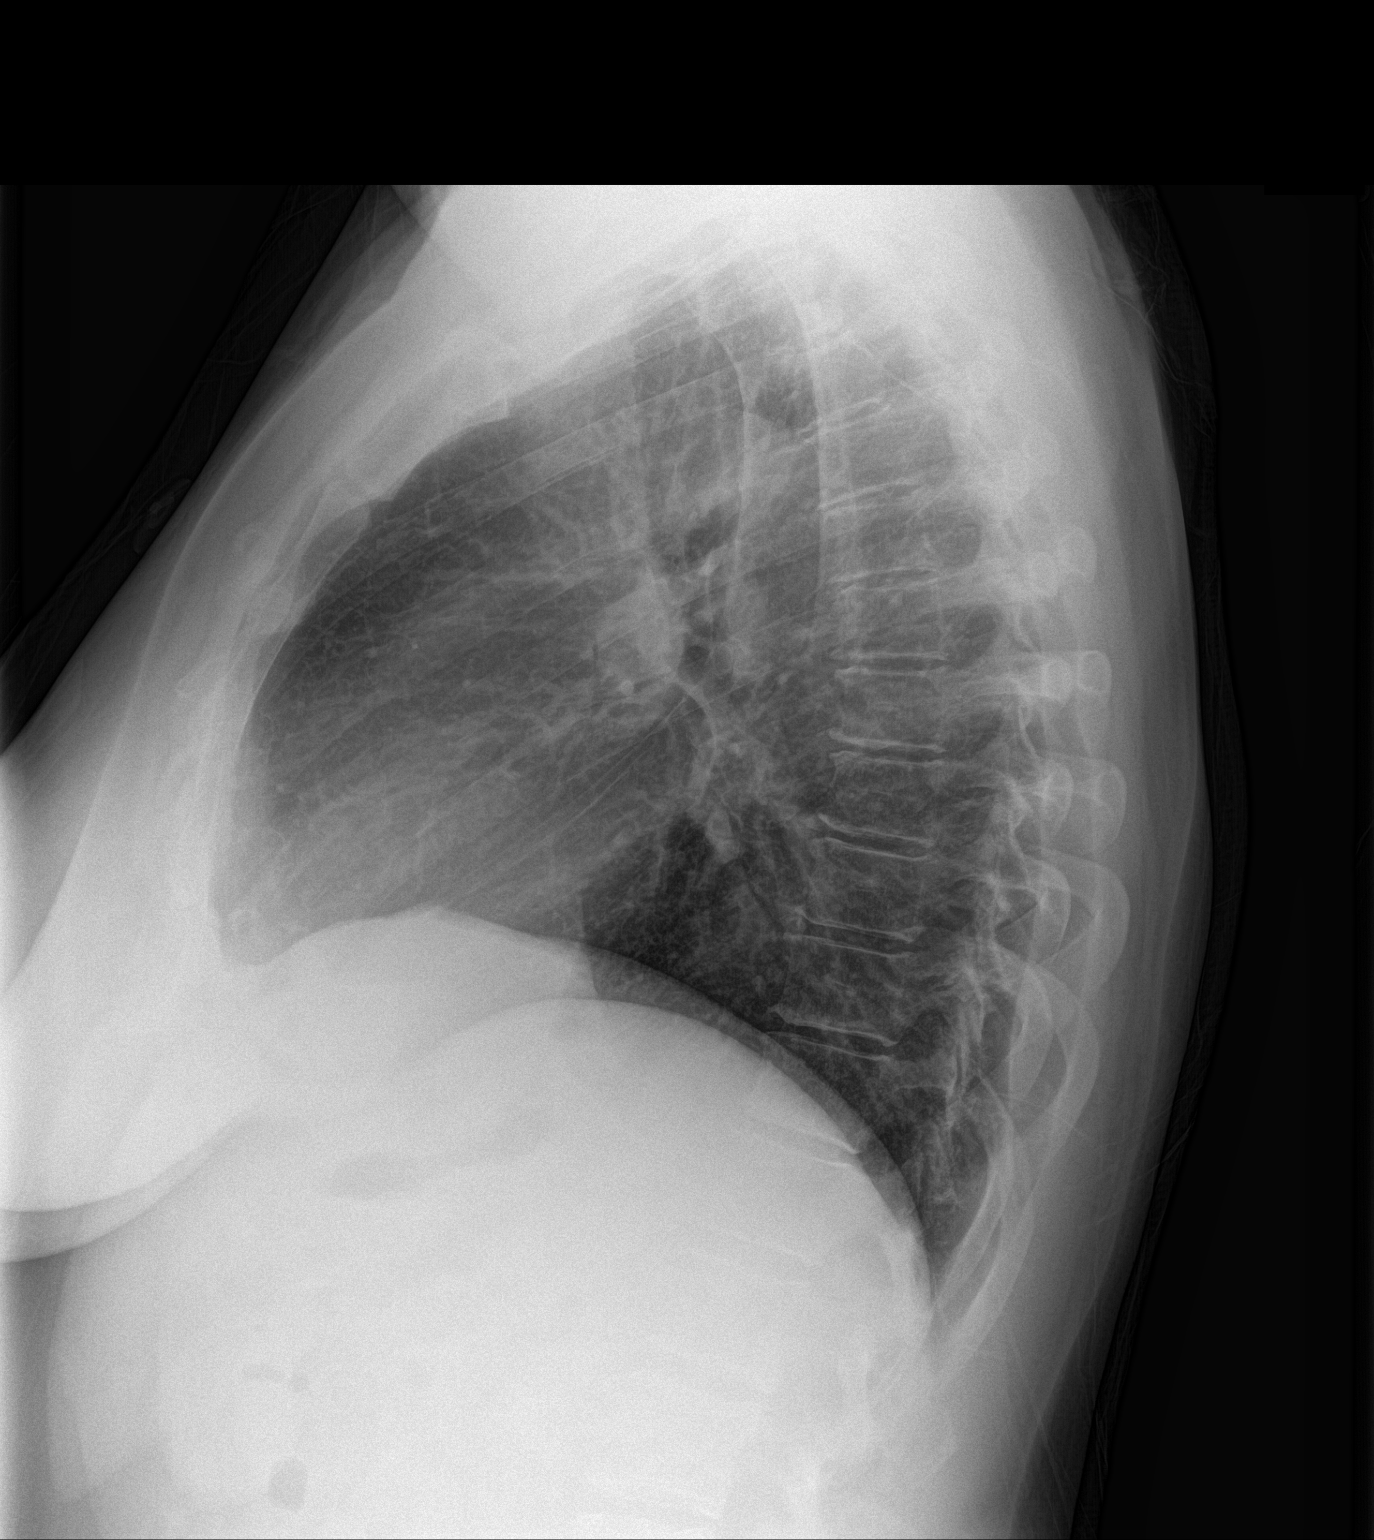

[2 of 2 positions shown; findings below may reference images not displayed]

FINDINGS: Cardiomediastinal silhouette is unremarkable. No infiltrate or
pleural effusion. No pulmonary edema. Mild degenerative changes
thoracic spine.
IMPRESSION: No active cardiopulmonary disease.

## 2016-03-09 MED ORDER — ASPIRIN 81 MG PO CHEW
243.0000 mg | CHEWABLE_TABLET | Freq: Once | ORAL | Status: AC
  Administered 2016-03-09: 243 mg via ORAL
  Filled 2016-03-09: qty 3

## 2016-03-09 NOTE — Discharge Instructions (Signed)

## 2016-03-09 NOTE — ED Provider Notes (Addendum)
[facility] Emergency Department Provider Note  ____________________________________________  Time seen: Approximately 11:28 AM  I have reviewed the triage vital signs and the nursing notes.   HISTORY  Chief Complaint Chest Pain   HPI Stacie Bell is a 55 y.o. female with a history of hyperlipidemia and hypertension who presents for evaluation of chest tightness. Patient reports that her hyperlipidemia and hypertension have been well-controlled for the last 3 years since she lost some weight. She reports she is no longer on medicine for 3 years for these conditions which were d/c by her PCP. She endorses going through a divorce and working 2 jobs which have caused her a lot of anxiety and stress. She hasn't been sleeping well. She reports 2 episodes of anxiety that woke her up from her sleep over the course of the last week. Also endorses 2 episodes of chest tightness over the last week. The last one a week ago resolved after taking some ginger ale. The other one started at 3 AM this morning. 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 She reports that the chest tightness has been constant since 3 AM although has eased off at this time. She reports the pain is not pleuritic, substernal, tightness, nonradiating, 3 out of 10. She denies dizziness, nausea, vomiting, diaphoresis, shortness of breath associated with this pain. She denies any personal history of blood clots. Her mother had CABG in her 7970s. Her father has had PEs and DVTs but he also has bladder cancer. She denies any history of smoking, recent travel or immobilization, history of cancer, hemoptysis, exogenous hormones, leg pain or swelling. She also denies coughing and fever.  Past Medical History  Diagnosis Date  . Hyperlipidemia   . Hypertension     Patient Active Problem List   Diagnosis Date Noted  . Allergic rhinitis 03/04/2016    Past Surgical  History  Procedure Laterality Date  . Tonsillectomy    . Arm muscle repair      Current Outpatient Rx  Name  Route  Sig  Dispense  Refill  . aspirin EC 81 MG tablet   Oral   Take 81 mg by mouth daily.         Marland Kitchen. BIOTIN PO   Oral   Take 1 tablet by mouth daily.          Marland Kitchen. CALCIUM PO   Oral   Take 1 tablet by mouth daily.          . fluticasone (FLONASE) 50 MCG/ACT nasal spray   Each Nare   Place 2 sprays into both nostrils daily.   16 g   6   . Multiple Vitamin (MULTIVITAMIN) capsule   Oral   Take 1 capsule by mouth daily.         . Omega-3 Fatty Acids (FISH OIL PO)   Oral   Take by mouth daily.         Marland Kitchen. OVER THE COUNTER MEDICATION      Meritrim         . vitamin B-12 (CYANOCOBALAMIN) 1000 MCG tablet   Oral   Take 1,000 mcg by mouth daily.           Allergies Biaxin; Doxycycline; Levaquin; Nitrofuran derivatives; and Penicillins  Family History  Problem Relation Age of Onset  . Diabetes Mother   . Heart disease Mother   . Hypertension Mother   . Clotting disorder Father  Social History Social History  Substance Use Topics  . Smoking status: Never Smoker   . Smokeless tobacco: Never Used  . Alcohol Use: 1.2 oz/week    2 Standard drinks or equivalent per week     Comment: socially    Review of Systems Constitutional: Negative for fever. + anxiety Eyes: Negative for visual changes. ENT: Negative for sore throat. Cardiovascular: + chest tightness Respiratory: Negative for shortness of breath. Gastrointestinal: Negative for abdominal pain, vomiting or diarrhea. Genitourinary: Negative for dysuria. Musculoskeletal: Negative for back pain. Skin: Negative for rash. Neurological: Negative for headaches, weakness or numbness.  ____________________________________________   PHYSICAL EXAM:  VITAL SIGNS: ED Triage Vitals  Enc Vitals Group     BP 03/09/16 1106 134/90 mmHg     Pulse Rate 03/09/16 1106 81     Resp 03/09/16 1106 18      Temp 03/09/16 1106 98.1 F (36.7 C)     Temp Source 03/09/16 1106 Oral     SpO2 03/09/16 1106 99 %     Weight 03/09/16 1106 190 lb (86.183 kg)     Height 03/09/16 1106 5\' 5"  (1.651 m)     Head Cir --      Peak Flow --      Pain Score 03/09/16 1106 5     Pain Loc --      Pain Edu? --      Excl. in GC? --     Constitutional: Alert and oriented. Well appearing and in no apparent distress. HEENT:      Head: Normocephalic and atraumatic.         Eyes: Conjunctivae are normal. Sclera is non-icteric. EOMI. PERRL      Mouth/Throat: Mucous membranes are moist.       Neck: Supple with no signs of meningismus. Cardiovascular: Regular rate and rhythm. No murmurs, gallops, or rubs. 2+ symmetrical distal pulses are present in all extremities. No JVD. Respiratory: Normal respiratory effort. Lungs are clear to auscultation bilaterally. No wheezes, crackles, or rhonchi.  Gastrointestinal: Soft, non tender, and non distended with positive bowel sounds. No rebound or guarding. Genitourinary: No suprapubic tenderness. No CVA tenderness. Musculoskeletal: Nontender with normal range of motion in all extremities. No edema, cyanosis, or erythema of extremities. Neurologic: Normal speech and language. Face is symmetric. Moving all extremities. No gross focal neurologic deficits are appreciated. Skin: Skin is warm, dry and intact. No rash noted. Psychiatric: Mood and affect are normal. Speech and behavior are normal.  ____________________________________________   LABS (all labs ordered are listed, but only abnormal results are displayed)  Labs Reviewed  BASIC METABOLIC PANEL  CBC  TROPONIN I  TROPONIN I   ____________________________________________  EKG  ED ECG REPORT I, Nita Sickle, the attending physician, personally viewed and interpreted this ECG.  Normal sinus rhythm, rate of 78, normal intervals, normal axis, no ST elevations or depressions. No prior for  comparison. ____________________________________________  RADIOLOGY  CXR; WNL ____________________________________________   PROCEDURES  Procedure(s) performed: None Critical Care performed:  None ____________________________________________   INITIAL IMPRESSION / ASSESSMENT AND PLAN / ED COURSE   55 y.o. female with a history of hyperlipidemia and hypertension who presents for evaluation of chest tightness since 3 Am with no other associated symptoms. Chest pain in a 55 y.o. female with low suspicion for cardiac (HEART score 2) or other serious etiology (including aortic dissection, pneumonia, pneumothorax, or pulmonary embolism) based her history and physical exam in the ED today. EKG normal. Plan for labs including  CBC, chemistries and troponin now and in 3 hours, CXR and re-evaluation for disposition. Will give  of ASA (patient had  at home PTA). Will observe patient on cardiac monitor while in the ED and pain control.    ----------------------------------------- 3:31 PM on 03/09/2016 -----------------------------------------  Troponin 2 negative. Labs and CXR with no acute findings. Patient no longer having pain in the emergency department. Recommended follow-up with PCP for stress test and also management of patient's anxiety and shortness. Discussed return precautions with patient and her father. Recommend patient takes 81 mg of aspirin daily.   Pertinent labs & imaging results that were available during my care of the patient were reviewed by me and considered in my medical decision making (see chart for details).   I discussed my evaluation of the patient's symptoms, my clinical impression, and my proposed outpatient treatment plan with patient/ family members. We have discussed anticipatory guidance, scheduled follow-up, and careful return precautions. The patient expresses understanding and is comfortable with the discharge plan. All patient's questions were  answered.   ____________________________________________   FINAL CLINICAL IMPRESSION(S) / ED DIAGNOSES  Final diagnoses:  Chest pain, unspecified chest pain type      NEW MEDICATIONS STARTED DURING THIS VISIT:  Discharge Medication List as of 03/09/2016  3:30 PM       Note:  This document was prepared using Dragon voice recognition software and may include unintentional dictation errors.    Nita Sickle, MD 03/09/16 1533  Nita Sickle, MD 03/10/16 [phone]

## 2016-03-09 NOTE — ED Notes (Signed)
States not feeling well since Wednesday, states chest pressure this AM, states headache over left eye, states back pain and anxiety of recent, pt awake and alert in no acute distress

## 2016-03-09 NOTE — ED Notes (Signed)
Patient transported to X-ray 

## 2016-03-09 NOTE — ED Notes (Signed)
Pt in via triage with complaints of intermitent chest "pressure", occuring earlier this week and again this morning, pt reports headache w/ pressure behind left eye with some blurred vision.  Pt reports "I haven't felt well since Wednesday."  Pt reports seeing PCP on Wed thinking she had a UTI but denies any new diagnosis.  Pt A/Ox4, hypertensive upon arrival, other vitals WDL, pt in no immediate distress at this time.

## 2016-03-10 ENCOUNTER — Telehealth: Payer: Self-pay | Admitting: Unknown Physician Specialty

## 2016-03-10 DIAGNOSIS — R079 Chest pain, unspecified: Secondary | ICD-10-CM

## 2016-03-10 NOTE — Telephone Encounter (Signed)
I will put an order in for her to see a cardiologist who can make that happen

## 2016-03-10 NOTE — Telephone Encounter (Signed)
I can do that, but I recommend them under the cardiologist supervision as those can detect things my ordering cannot.  Bruce protocol vs supervised by cardiologist

## 2016-03-10 NOTE — Telephone Encounter (Signed)
Called and let patient know what Elnita MaxwellCheryl said. Patient stated that the doctor at the ER told her that her PCP should be able to order an outpatient stress test. Elnita Maxwellheryl, is this something we can order or what?

## 2016-03-10 NOTE — Telephone Encounter (Signed)
Called and left patient a voicemail asking for her to please return my call.  

## 2016-03-10 NOTE — Telephone Encounter (Signed)
Routing to PCP

## 2016-03-10 NOTE — Telephone Encounter (Signed)
Patient returned call. I let her know everything Stacie MaxwellCheryl said and she said she wanted to think about her options and she would call us back and let us know.

## 2016-03-10 NOTE — Telephone Encounter (Signed)
Pt called stated she went to the hospital yesterday, they did an EKG, blood work, and x rays due to pressure in her chest. They stated everything came back normal but wanted her to talk to her PCP about having a Stress Test done. Please call pt ASAP. Thanks.

## 2016-03-11 DIAGNOSIS — R079 Chest pain, unspecified: Secondary | ICD-10-CM | POA: Insufficient documentation

## 2016-03-11 NOTE — Telephone Encounter (Signed)
Routing to Marquetteheryl for new referral to Cardiology. Patient requests to [facility].

## 2016-03-11 NOTE — Telephone Encounter (Signed)
Yes, Stacie Bell can enter in a new referral for Cardiology. The other Corinda GublerLebauer has already marked as Patient Refusal and is closed.

## 2016-03-11 NOTE — Telephone Encounter (Signed)
Order in.

## 2016-03-11 NOTE — Telephone Encounter (Signed)
Pt called back and stated that she would rather go see [facility]als[facility].

## 2016-03-11 NOTE — Telephone Encounter (Signed)
Stacie Bell, is this something that can be changed?

## 2016-03-13 DIAGNOSIS — R079 Chest pain, unspecified: Secondary | ICD-10-CM | POA: Insufficient documentation

## 2017-02-19 DIAGNOSIS — Z1231 Encounter for screening mammogram for malignant neoplasm of breast: Secondary | ICD-10-CM | POA: Diagnosis not present

## 2017-02-19 DIAGNOSIS — Z803 Family history of malignant neoplasm of breast: Secondary | ICD-10-CM | POA: Diagnosis not present

## 2017-02-22 ENCOUNTER — Telehealth: Payer: Self-pay | Admitting: Obstetrics and Gynecology

## 2017-02-22 NOTE — Telephone Encounter (Signed)
Irving Burtonmily from [facility] needs the last 3 years of mammograms and reports on pt. Please mail to   [facility] ATTN: EMILY [address] [place], [place] [id number]

## 2017-02-23 NOTE — Telephone Encounter (Signed)
mammo on cds and put in mail

## 2017-03-08 ENCOUNTER — Encounter: Payer: Self-pay | Admitting: Obstetrics and Gynecology

## 2017-03-09 ENCOUNTER — Ambulatory Visit: Payer: Self-pay | Admitting: Obstetrics and Gynecology

## 2017-05-04 ENCOUNTER — Ambulatory Visit (INDEPENDENT_AMBULATORY_CARE_PROVIDER_SITE_OTHER): Payer: BLUE CROSS/BLUE SHIELD | Admitting: Obstetrics and Gynecology

## 2017-05-04 ENCOUNTER — Encounter: Payer: Self-pay | Admitting: Obstetrics and Gynecology

## 2017-05-04 VITALS — BP 122/82 | Ht 65.0 in | Wt 187.0 lb

## 2017-05-04 DIAGNOSIS — Z1239 Encounter for other screening for malignant neoplasm of breast: Secondary | ICD-10-CM

## 2017-05-04 DIAGNOSIS — Z0000 Encounter for general adult medical examination without abnormal findings: Secondary | ICD-10-CM | POA: Diagnosis not present

## 2017-05-04 DIAGNOSIS — Z01419 Encounter for gynecological examination (general) (routine) without abnormal findings: Secondary | ICD-10-CM | POA: Diagnosis not present

## 2017-05-04 DIAGNOSIS — R87820 Cervical low risk human papillomavirus (HPV) DNA test positive: Secondary | ICD-10-CM | POA: Diagnosis not present

## 2017-05-04 DIAGNOSIS — Z1329 Encounter for screening for other suspected endocrine disorder: Secondary | ICD-10-CM | POA: Diagnosis not present

## 2017-05-04 DIAGNOSIS — Z1231 Encounter for screening mammogram for malignant neoplasm of breast: Secondary | ICD-10-CM | POA: Diagnosis not present

## 2017-05-04 DIAGNOSIS — Z124 Encounter for screening for malignant neoplasm of cervix: Secondary | ICD-10-CM | POA: Diagnosis not present

## 2017-05-04 DIAGNOSIS — Z131 Encounter for screening for diabetes mellitus: Secondary | ICD-10-CM

## 2017-05-04 DIAGNOSIS — Z1211 Encounter for screening for malignant neoplasm of colon: Secondary | ICD-10-CM

## 2017-05-04 DIAGNOSIS — R5382 Chronic fatigue, unspecified: Secondary | ICD-10-CM

## 2017-05-04 DIAGNOSIS — Z1151 Encounter for screening for human papillomavirus (HPV): Secondary | ICD-10-CM

## 2017-05-04 LAB — HEMOCCULT GUIAC POC 1CARD (OFFICE): FECAL OCCULT BLD: NEGATIVE

## 2017-05-04 NOTE — Patient Instructions (Signed)
[facility]: [phone]  [facility]: [phone] 

## 2017-05-04 NOTE — Progress Notes (Signed)
PCP: Gabriel Cirri, NP   Chief Complaint  Patient presents with  . Gynecologic Exam    HPI:      [name] is a 56 y.o. G2P1001 who LMP was No LMP recorded (lmp unknown). Patient is postmenopausal., presents today for her annual examination.  Her menses are absent due to menopause. She does not have intermenstrual bleeding.  She does have vasomotor sx--mild night sweats.  Sex activity: single partner, contraception - post menopausal status. She does have vaginal dryness.  Last Pap: November 28, 2015  Results were: no abnormalities /POS HPV DNA; same results 2/16. Pt opted for repeat pap today. Hx of STDs: HPV  Last mammogram: 6/18 through work.  Results were: normal--routine follow-up in 12 months There is a FH of breast cancer in her mat aunt and PGGM, genetic testing not indicated for pt. There is no FH of ovarian cancer. The patient does do self-breast exams.  Colonoscopy: never (due to lack of ins); willing to do this yr. Pt has FH colon cancer in [place]. Doesn't qualify for cancer genetic testing.   Tobacco use: The patient denies current or previous tobacco use. Alcohol use: none Exercise: moderately active  She does get adequate calcium and Vitamin D in her diet.  She complains of fatigue. She sleeps 8 hrs nightly and 12 hrs if she is off work. She sometimes feels refreshed but is then tired a few hrs later. She could nap if she sat down. She has been told she snores. She is exercising. No recent labs.   Past Medical History:  Diagnosis Date  . CIN I (cervical intraepithelial neoplasia I) 07/2012  . 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 Hyperlipidemia   . Hypertension     Past Surgical History:  Procedure Laterality Date  . arm muscle repair    . TONSILLECTOMY      Family History  Problem Relation Age of Onset  . Diabetes Mother   . Heart disease Mother   . Hypertension Mother   . Clotting disorder Father   . Bladder Cancer Father 49  . Breast cancer  Maternal Aunt 60  . Colon cancer Maternal Grandmother 1    Social History   Social History  . Marital status: Married    Spouse name: N/A  . Number of children: N/A  . Years of education: N/A   Occupational History  . Not on file.   Social History Main Topics  . Smoking status: Never Smoker  . Smokeless tobacco: Never Used  . Alcohol use 1.2 oz/week    2 Standard drinks or equivalent per week     Comment: socially  . Drug use: No  . Sexual activity: Not Currently    Partners: Male   Other Topics Concern  . Not on file   Social History Narrative  . No narrative on file    No outpatient prescriptions have been marked as taking for the 05/04/17 encounter (Office Visit) with Srinika Delone B, PA-C.      ROS:  Review of Systems  Constitutional: Positive for fatigue. Negative for fever and unexpected weight change.  Respiratory: Negative for cough, shortness of breath and wheezing.   Cardiovascular: Negative for chest pain, palpitations and leg swelling.  Gastrointestinal: Negative for blood in stool, constipation, diarrhea, nausea and vomiting.  Endocrine: Negative for cold intolerance, heat intolerance and polyuria.  Genitourinary: Positive for dyspareunia. Negative for dysuria, flank pain, frequency, genital sores, hematuria, menstrual problem, pelvic pain, urgency, vaginal bleeding,  vaginal discharge and vaginal pain.  Musculoskeletal: Negative for back pain, joint swelling and myalgias.  Skin: Negative for rash.  Neurological: Positive for dizziness. Negative for syncope, light-headedness, numbness and headaches.  Hematological: Negative for adenopathy.  Psychiatric/Behavioral: Negative for agitation, confusion, sleep disturbance and suicidal ideas. The patient is not nervous/anxious.      Objective: BP 122/82   Ht 5\' 5"  (1.651 m)   Wt 187 lb (84.8 kg)   LMP  (LMP Unknown)   BMI 31.12 kg/m    Physical Exam  Constitutional: She is oriented to person,  place, and time. She appears well-developed and well-nourished.  Genitourinary: Vagina normal and uterus normal. There is no rash or tenderness on the right labia. There is no rash or tenderness on the left labia. No erythema or tenderness in the vagina. No vaginal discharge found. Right adnexum does not display mass and does not display tenderness. Left adnexum does not display mass and does not display tenderness. Cervix does not exhibit motion tenderness or polyp. Uterus is not enlarged or tender. Rectal exam shows no fissure, no tenderness and guaiac negative stool.  Neck: Normal range of motion. No thyromegaly present.  Cardiovascular: Normal rate, regular rhythm and normal heart sounds.   No murmur heard. Pulmonary/Chest: Effort normal and breath sounds normal. Right breast exhibits no mass, no nipple discharge, no skin change and no tenderness. Left breast exhibits no mass, no nipple discharge, no skin change and no tenderness.  Abdominal: Soft. There is no tenderness. There is no guarding.  Musculoskeletal: Normal range of motion.  Neurological: She is alert and oriented to person, place, and time. No cranial nerve deficit.  Psychiatric: She has a normal mood and affect. Her behavior is normal.  Vitals reviewed.   Results: Results for orders placed or performed in visit on 05/04/17 (from the past 24 hour(s))  POCT Occult Blood Stool     Status: Normal   Collection Time: 05/04/17  3:33 PM  Result Value Ref Range   Fecal Occult Blood, POC Negative Negative   Card #1 Date     Card #2 Fecal Occult Blod, POC     Card #2 Date     Card #3 Fecal Occult Blood, POC     Card #3 Date      Assessment/Plan:  Encounter for annual routine gynecological examination  Cervical cancer screening - Plan: IGP, Aptima HPV  Screening for HPV (human papillomavirus) - Plan: IGP, Aptima HPV  Cervical low risk human papillomavirus (HPV) DNA test positive - Will call pt with results and dispo. If still  abn, will do colpo.  Screening for breast cancer - Pt does mammos through work. - Plan: MM DIGITAL SCREENING BILATERAL  Chronic fatigue - Chck labs. If neg, refer for sleep study. - Plan: Comprehensive metabolic panel, Hemoglobin A1c, TSH + free T4  Blood tests for routine general physical examination - Plan: Comprehensive metabolic panel, Hemoglobin A1c, TSH + free T4  Thyroid disorder screening - Plan: TSH + free T4  Screening for diabetes mellitus - Plan: Hemoglobin A1c  Screening for colon cancer - Neg FOBT. Refer to GI for scr colonoscopy due to age/FH. - Plan: POCT Occult Blood Stool, Ambulatory referral to Gastroenterology          GYN counsel mammography screening, menopause, adequate intake of calcium and vitamin D, diet and exercise    F/U  Return in about 1 year (around 05/04/2018).  Clayburn Weekly B. Reymond Maynez, PA-C 05/04/2017 3:38 PM

## 2017-05-05 ENCOUNTER — Telehealth: Payer: Self-pay | Admitting: Obstetrics and Gynecology

## 2017-05-05 DIAGNOSIS — R0683 Snoring: Secondary | ICD-10-CM

## 2017-05-05 DIAGNOSIS — R5382 Chronic fatigue, unspecified: Secondary | ICD-10-CM

## 2017-05-05 LAB — COMPREHENSIVE METABOLIC PANEL
ALBUMIN: 4.6 g/dL (ref 3.5–5.5)
ALK PHOS: 64 IU/L (ref 39–117)
ALT: 12 IU/L (ref 0–32)
AST: 17 IU/L (ref 0–40)
Albumin/Globulin Ratio: 1.8 (ref 1.2–2.2)
BILIRUBIN TOTAL: 0.3 mg/dL (ref 0.0–1.2)
BUN/Creatinine Ratio: 21 (ref 9–23)
BUN: 15 mg/dL (ref 6–24)
CHLORIDE: 105 mmol/L (ref 96–106)
CO2: 25 mmol/L (ref 20–29)
CREATININE: 0.71 mg/dL (ref 0.57–1.00)
Calcium: 9.5 mg/dL (ref 8.7–10.2)
GFR calc Af Amer: 111 mL/min/{1.73_m2} (ref >59)
GFR calc non Af Amer: 96 mL/min/{1.73_m2} (ref >59)
GLUCOSE: 108 mg/dL — AB (ref 65–99)
Globulin, Total: 2.5 g/dL (ref 1.5–4.5)
Potassium: 3.9 mmol/L (ref 3.5–5.2)
Sodium: 143 mmol/L (ref 134–144)
TOTAL PROTEIN: 7.1 g/dL (ref 6.0–8.5)

## 2017-05-05 LAB — HEMOGLOBIN A1C
ESTIMATED AVERAGE GLUCOSE: 100 mg/dL
HEMOGLOBIN A1C: 5.1 % (ref 4.8–5.6)

## 2017-05-05 LAB — TSH+FREE T4
FREE T4: 0.86 ng/dL (ref 0.82–1.77)
TSH: 1.78 u[IU]/mL (ref 0.450–4.500)

## 2017-05-05 NOTE — Telephone Encounter (Signed)
LM with normal lab results. Refer pt for sleep study due to extreme fatigue and hx of snoring.

## 2017-05-07 LAB — IGP, APTIMA HPV
HPV Aptima: NEGATIVE
PAP SMEAR COMMENT: 0

## 2017-06-09 DIAGNOSIS — G4733 Obstructive sleep apnea (adult) (pediatric): Secondary | ICD-10-CM | POA: Diagnosis not present

## 2017-06-10 DIAGNOSIS — Z23 Encounter for immunization: Secondary | ICD-10-CM | POA: Diagnosis not present

## 2017-06-18 ENCOUNTER — Telehealth: Payer: Self-pay

## 2017-06-18 NOTE — Telephone Encounter (Signed)
Pt inquiring about Sleep Study Results. Pt request call back after 2pm any day next week. Cb#[phone].

## 2017-06-21 NOTE — Telephone Encounter (Signed)
LM with results. Pt has sleep apnea. CPAP device getting authorized with ins. Recommended wt loss. They should f/u with pt.

## 2017-08-04 DIAGNOSIS — Z01818 Encounter for other preprocedural examination: Secondary | ICD-10-CM | POA: Diagnosis not present

## 2017-08-05 DIAGNOSIS — H524 Presbyopia: Secondary | ICD-10-CM | POA: Diagnosis not present

## 2017-08-16 ENCOUNTER — Encounter: Payer: Self-pay | Admitting: Unknown Physician Specialty

## 2017-08-16 ENCOUNTER — Telehealth: Payer: Self-pay | Admitting: Unknown Physician Specialty

## 2017-08-16 ENCOUNTER — Ambulatory Visit
Admission: RE | Admit: 2017-08-16 | Discharge: 2017-08-16 | Disposition: A | Discharge status: Home / Self Care | Payer: BLUE CROSS/BLUE SHIELD | Source: Ambulatory Visit | Attending: Unknown Physician Specialty | Admitting: Unknown Physician Specialty

## 2017-08-16 ENCOUNTER — Ambulatory Visit: Payer: BLUE CROSS/BLUE SHIELD | Admitting: Unknown Physician Specialty

## 2017-08-16 VITALS — BP 147/97 | HR 80 | Temp 98.1°F | Wt 191.0 lb

## 2017-08-16 DIAGNOSIS — J019 Acute sinusitis, unspecified: Secondary | ICD-10-CM

## 2017-08-16 DIAGNOSIS — R0602 Shortness of breath: Secondary | ICD-10-CM | POA: Diagnosis not present

## 2017-08-16 DIAGNOSIS — H6503 Acute serous otitis media, bilateral: Secondary | ICD-10-CM | POA: Diagnosis not present

## 2017-08-16 DIAGNOSIS — R05 Cough: Secondary | ICD-10-CM

## 2017-08-16 DIAGNOSIS — R059 Cough, unspecified: Secondary | ICD-10-CM

## 2017-08-16 IMAGING — DX DG CHEST 2V
2 series · 2 of 2 positions shown · non-contrast
Comparison: Chest x-ray of [DATE]

CLINICAL DATA: One week of cough with mid chest tightness but no
shortness of breath. Abnormal chest exam in the right lower lung.
Nonsmoker.

EXAM:
CHEST  2 VIEW

[chest pa]
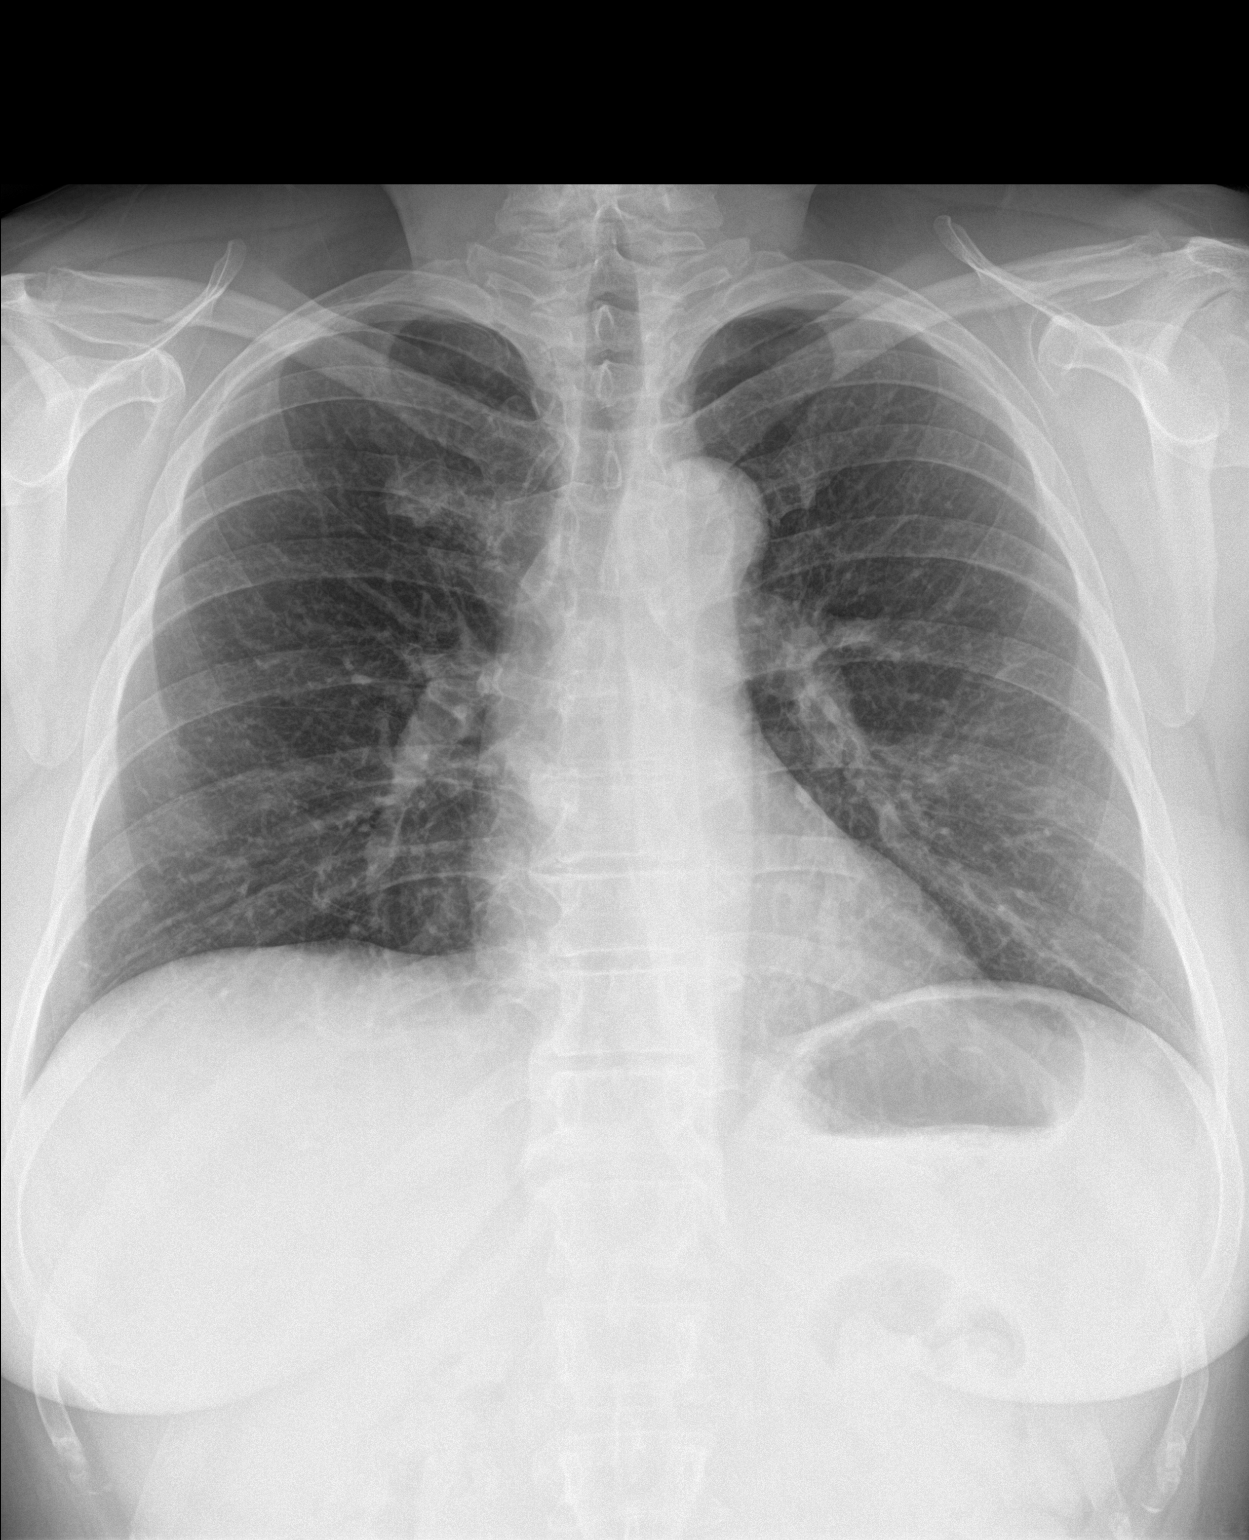

[chest lat]
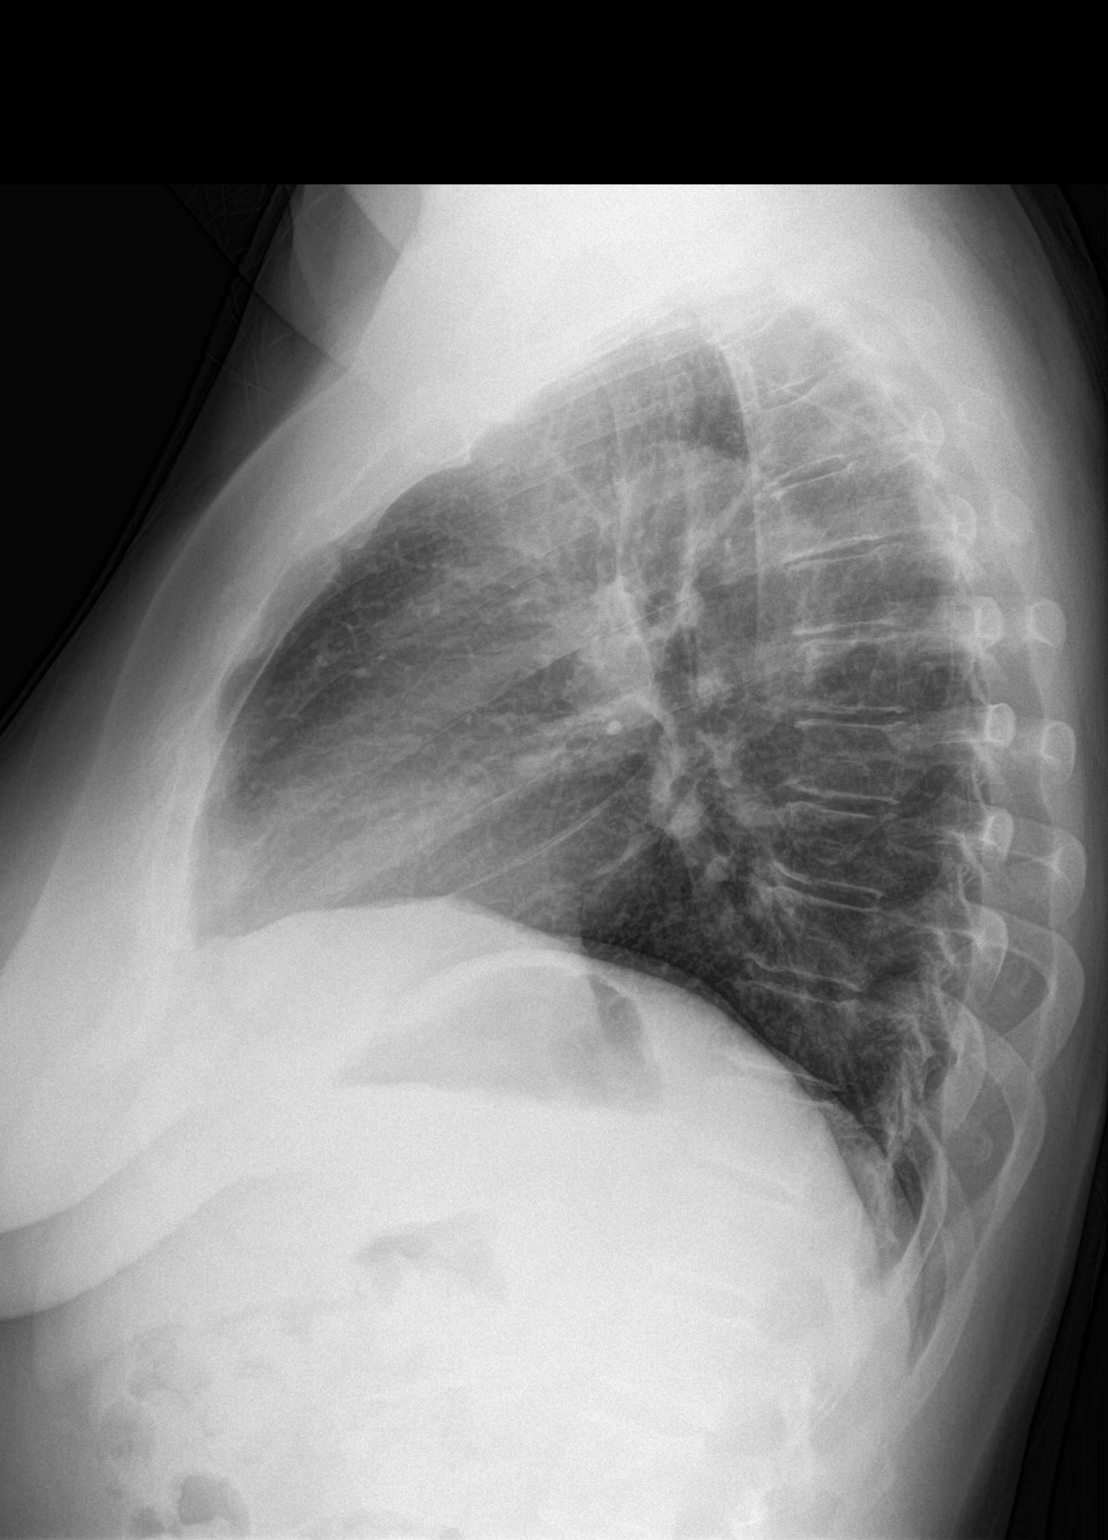

[2 of 2 positions shown; findings below may reference images not displayed]

FINDINGS: The lungs are adequately inflated and clear. The heart and pulmonary
vascularity are normal. The mediastinum is normal in width. There is
no pleural effusion. The trachea is midline. The bony thorax
exhibits no acute abnormality.
IMPRESSION: There is no pneumonia nor other acute cardiopulmonary abnormality.

## 2017-08-16 MED ORDER — CLINDAMYCIN HCL 300 MG PO CAPS: 300.0000 mg | ORAL_CAPSULE | Freq: Three times a day (TID) | ORAL | 0 refills | Status: DC

## 2017-08-16 NOTE — Telephone Encounter (Signed)
Pt  questioning  The  Technique     Of  Chest   X  Ray  Which  Was   Done  Today      Explained  To  Patient     That  A    Chest  X  Ray comprised  A  View  Of   Front to  Back  And  A  Lateral  View   Which  Is   Taken  At a  Different  Angle   From the  Side  Pt  Denies  Any   Symptoms

## 2017-08-16 NOTE — Progress Notes (Signed)
BP (!) 147/97 (BP Location: Left Arm, Cuff Size: Normal)   Pulse 80   Temp 98.1 F (36.7 C) (Oral)   Wt 191 lb (86.6 kg)   LMP  (LMP Unknown)   SpO2 97%   BMI 31.78 kg/m    Subjective:    Patient ID: Stacie Stacie Maul, female    DOB: [birth year], 56 y.o.   MRN: [id number]  HPI: Stacie Stacie Stacie is a 56 y.o. female  Chief Complaint  Patient presents with  . URI    pt states she has had congestion, pressure, and ear pain for a week. States she has taken mucinex that seemed to break up the mucus in her chest but that was it   Sinusitis  This is a new problem. The current episode started in the past 7 days. The problem has been gradually worsening since onset. There has been no fever. She is experiencing no pain. Associated symptoms include congestion, coughing, ear pain, a hoarse voice, sinus pressure, sneezing and a sore throat. Pertinent negatives include no chills, diaphoresis, headaches, neck pain, shortness of breath or swollen glands. Past treatments include acetaminophen. The treatment provided moderate relief.    Relevant past medical, surgical, family and social history reviewed and updated as indicated. Interim medical history since our last visit reviewed. Allergies and medications reviewed and updated.  Review of Systems  Constitutional: Negative for chills and diaphoresis.  HENT: Positive for congestion, ear pain, hoarse voice, sinus pressure, sneezing and sore throat.   Respiratory: Positive for cough. Negative for shortness of breath.   Musculoskeletal: Negative for neck pain.  Neurological: Negative for headaches.    Per HPI unless specifically indicated above     Objective:    BP (!) 147/97 (BP Location: Left Arm, Cuff Size: Normal)   Pulse 80   Temp 98.1 F (36.7 C) (Oral)   Wt 191 lb (86.6 kg)   LMP  (LMP Unknown)   SpO2 97%   BMI 31.78 kg/m   Wt Readings from Last 3 Encounters:  08/16/17 191 lb (86.6 kg)  05/04/17 187 lb (84.8 kg)  03/09/16 190 lb  (86.2 kg)    Physical Exam  Constitutional: She is oriented to person, place, and time. She appears well-developed and well-nourished. No distress.  HENT:  Head: Normocephalic and atraumatic.  Right Ear: Ear canal normal. Tympanic membrane is injected and bulging. Decreased hearing is noted.  Left Ear: Ear canal normal. Tympanic membrane is injected and bulging. Decreased hearing is noted.  Nose: No rhinorrhea. Right sinus exhibits maxillary sinus tenderness. Right sinus exhibits no frontal sinus tenderness. Left sinus exhibits maxillary sinus tenderness. Left sinus exhibits no frontal sinus tenderness.  Eyes: Conjunctivae and lids are normal. Right eye exhibits no discharge. Left eye exhibits no discharge. No scleral icterus.  Cardiovascular: Normal rate and regular rhythm.  Pulmonary/Chest: Effort normal. No respiratory distress. She has rales in the right lower field.  Abdominal: Normal appearance. There is no splenomegaly or hepatomegaly.  Musculoskeletal: Normal range of motion.  Neurological: She is alert and oriented to person, place, and time.  Skin: Skin is intact. No rash noted. No pallor.  Psychiatric: She has a normal mood and affect. Her behavior is normal. Judgment and thought content normal.     Assessment & Plan:   Problem List Items Addressed This Visit    None    Visit Diagnoses    Acute non-recurrent sinusitis, unspecified location    -  Primary   Rx for Cleocin  300 mg TID.  Multiple drug allergies.  not to be out of work for 2 days   Relevant Medications   clindamycin (CLEOCIN) 300 MG capsule   Cough       r/o walking pneumonia with rhonchi riht lower lung fields   Relevant Orders   DG Chest 2 View   Bilateral acute serous otitis media, recurrence not specified       Due to sinusitis.    Relevant Medications   clindamycin (CLEOCIN) 300 MG capsule       Follow up plan: Return if symptoms worsen or fail to improve.

## 2017-08-19 ENCOUNTER — Ambulatory Visit: Payer: Self-pay | Admitting: *Deleted

## 2017-08-19 NOTE — Telephone Encounter (Signed)
Pt  Was   Seen  3  Days  Ago  By  Gabriel Cirriheryl Wicker     For  Ear   Symptoms  Had  Chest    Xray   And  Was  Put   On  Anti biotics  As   Well .The patient  Was  Placed  On  Antibiotics   She  Took  The  Anti biotics  For  Several  Days  And    Had  Some  Rash   And  Tightness  In  Chest yest  So  She  Stopped  The  Med .  She  Reports  Her  Ears  Feel  Full     And   She  Has  A   Dry  Cough  And  Nasal  stuffyness   As   Well .   appt  Made  tommorow  At [address]   Per    Christian at  Dr  [facility]  [facility]  . Reason for Disposition . Ear congestion present > 48 hours  Answer Assessment - Initial Assessment Questions 1. LOCATION: "Which ear is involved?"        Both   2. SENSATION: "Describe how the ear feels."        Feels   Stopped   3. ONSET:  "When did the ear symptoms start?"          approx  5  Days    4. PAIN: "Do you also have an earache?" If so, ask: "How bad is it?" (Scale 1-10; or mild, moderate, severe)         No  pain 5. CAUSE: "What do you think is causing the ear congestion?"          Not  Sure    6. URI: "Do you have a runny nose or cough?"           DRY  COUGH    STUFFY  NOSE    7. NASAL ALLERGIES: "Are there symptoms of hay fever, such as sneezing or a clear nasal discharge?"     STUFFY  NOSE     DRY  THROAT   SYMPTOMS  IN  AM   8. PREGNANCY: "Is there any chance you are pregnant?" "When was your last menstrual period?"     N/A  Protocols used: EAR - CONGESTION-A-AH

## 2017-08-20 ENCOUNTER — Telehealth: Payer: Self-pay | Admitting: Unknown Physician Specialty

## 2017-08-20 ENCOUNTER — Ambulatory Visit: Payer: BLUE CROSS/BLUE SHIELD | Admitting: Unknown Physician Specialty

## 2017-08-20 ENCOUNTER — Encounter: Payer: Self-pay | Admitting: Unknown Physician Specialty

## 2017-08-20 VITALS — BP 144/91 | HR 80 | Temp 97.7°F | Wt 193.8 lb

## 2017-08-20 DIAGNOSIS — H6503 Acute serous otitis media, bilateral: Secondary | ICD-10-CM | POA: Diagnosis not present

## 2017-08-20 DIAGNOSIS — J0100 Acute maxillary sinusitis, unspecified: Secondary | ICD-10-CM

## 2017-08-20 MED ORDER — FLUTICASONE PROPIONATE 50 MCG/ACT NA SUSP: 2.0000 | Freq: Every day | NASAL | 6 refills | Status: DC

## 2017-08-20 MED ORDER — PREDNISONE 20 MG PO TABS: 40.0000 mg | ORAL_TABLET | Freq: Every day | ORAL | 0 refills | Status: DC

## 2017-08-20 NOTE — Progress Notes (Signed)
BP (!) 144/91   Pulse 80   Temp 97.7 F (36.5 C) (Oral)   Wt 193 lb 12.8 oz (87.9 kg)   LMP  (LMP Unknown)   SpO2 93%   BMI 32.25 kg/m    Subjective:    Patient ID: Stacie Bell, female    DOB: 12/23/1960, 56 y.o.   MRN: [id number]  HPI: Stacie Bell is a 56 y.o. female  Chief Complaint  Patient presents with  . Follow-up    URI f/up from Monday    Pt with allergies to multiple antibiotics.  She was seen 12/17 for a sinusitis.  She was given Omnicef.  She took this for several days and experienced a rash and chest tightness.  She has a dry cough, ear fullness, and nasal stuffiness.  States she has better nasal congestion but her ears are still bothering her.    States she is "talking in a barrell."  Cough at night is persistent.    Social History   Socioeconomic History  . Marital status: Married    Spouse name: Not on file  . Number of children: Not on file  . Years of education: Not on file  . Highest education level: Not on file  Social Needs  . Financial resource strain: Not on file  . Food insecurity - worry: Not on file  . Food insecurity - inability: Not on file  . Transportation needs - medical: Not on file  . Transportation needs - non-medical: Not on file  Occupational History  . Not on file  Tobacco Use  . Smoking status: Never Smoker  . Smokeless tobacco: Never Used  Substance and Sexual Activity  . Alcohol use: Yes    Alcohol/week: 1.2 oz    Types: 2 Standard drinks or equivalent per week    Comment: socially  . Drug use: No  . Sexual activity: Not Currently    Partners: Male  Other Topics Concern  . Not on file  Social History Narrative  . Not on file    Relevant past medical, surgical, family and social history reviewed and updated as indicated. Interim medical history since our last visit reviewed. Allergies and medications reviewed and updated.  Review of Systems  Constitutional: Negative.   Respiratory: Negative.     Genitourinary: Negative.   Musculoskeletal: Negative.     Per HPI unless specifically indicated above     Objective:    BP (!) 144/91   Pulse 80   Temp 97.7 F (36.5 C) (Oral)   Wt 193 lb 12.8 oz (87.9 kg)   LMP  (LMP Unknown)   SpO2 93%   BMI 32.25 kg/m   Wt Readings from Last 3 Encounters:  08/20/17 193 lb 12.8 oz (87.9 kg)  08/16/17 191 lb (86.6 kg)  05/04/17 187 lb (84.8 kg)    Physical Exam  Constitutional: She is oriented to person, place, and time. She appears well-developed and well-nourished. No distress.  HENT:  Head: Normocephalic and atraumatic.  Right Ear: Tympanic membrane and ear canal normal.  Left Ear: Tympanic membrane and ear canal normal.  Nose: Rhinorrhea present. Right sinus exhibits no maxillary sinus tenderness and no frontal sinus tenderness. Left sinus exhibits no maxillary sinus tenderness and no frontal sinus tenderness.  Mouth/Throat: Mucous membranes are normal. Posterior oropharyngeal erythema present.  Eyes: Conjunctivae and lids are normal. Right eye exhibits no discharge. Left eye exhibits no discharge. No scleral icterus.  Cardiovascular: Normal rate and regular rhythm.  Pulmonary/Chest:  Effort normal and breath sounds normal. No respiratory distress.  Abdominal: Normal appearance. There is no splenomegaly or hepatomegaly.  Musculoskeletal: Normal range of motion.  Neurological: She is alert and oriented to person, place, and time.  Skin: Skin is intact. No rash noted. No pallor.  Psychiatric: She has a normal mood and affect. Her behavior is normal. Judgment and thought content normal.    Results for orders placed or performed in visit on 05/04/17  Comprehensive metabolic panel  Result Value Ref Range   Glucose 108 (H) 65 - 99 mg/dL   BUN 15 6 - 24 mg/dL   Creatinine, Ser 1.610.71 0.57 - 1.00 mg/dL   GFR calc non Af Amer 96 >59 mL/min/1.73   GFR calc Af Amer 111 >59 mL/min/1.73   BUN/Creatinine Ratio 21 9 - 23   Sodium 143 134 - 144  mmol/L   Potassium 3.9 3.5 - 5.2 mmol/L   Chloride 105 96 - 106 mmol/L   CO2 25 20 - 29 mmol/L   Calcium 9.5 8.7 - 10.2 mg/dL   Total Protein 7.1 6.0 - 8.5 g/dL   Albumin 4.6 3.5 - 5.5 g/dL   Globulin, Total 2.5 1.5 - 4.5 g/dL   Albumin/Globulin Ratio 1.8 1.2 - 2.2   Bilirubin Total 0.3 0.0 - 1.2 mg/dL   Alkaline Phosphatase 64 39 - 117 IU/L   AST 17 0 - 40 IU/L   ALT 12 0 - 32 IU/L  Hemoglobin A1c  Result Value Ref Range   Hgb A1c MFr Bld 5.1 4.8 - 5.6 %   Est. average glucose Bld gHb Est-mCnc 100 mg/dL  TSH + free T4  Result Value Ref Range   TSH 1.780 0.450 - 4.500 uIU/mL   Free T4 0.86 0.82 - 1.77 ng/dL  POCT Occult Blood Stool  Result Value Ref Range   Fecal Occult Blood, POC Negative Negative   Card #1 Date     Card #2 Fecal Occult Blod, POC     Card #2 Date     Card #3 Fecal Occult Blood, POC     Card #3 Date    IGP, Aptima HPV  Result Value Ref Range   DIAGNOSIS: Comment    Specimen adequacy: Comment    Clinician Provided ICD10 Comment    Performed by: Comment    PAP Smear Comment .    Note: Comment    Test Methodology Comment    HPV Aptima Negative Negative      Assessment & Plan:   Problem List Items Addressed This Visit    None    Visit Diagnoses    Bilateral acute serous otitis media, recurrence not specified    -  Primary   Treat with prednisone and Flonase.  Pt ed on serous otitis.     Acute non-recurrent maxillary sinusitis       Possibly has an incomplete treatment of sinusisitis. No rash with antibiotics and pt thinks itching may be anxiety. Treat with prednisone. Consider rechallenge   Relevant Medications   predniSONE (DELTASONE) 20 MG tablet   fluticasone (FLONASE) 50 MCG/ACT nasal spray      Consider ENT referral  Follow up plan: Return if symptoms worsen or fail to improve.

## 2017-08-20 NOTE — Telephone Encounter (Signed)
Copied from CRM [id number]. Topic: Quick Communication - Patient Running Late >> Aug 20, 2017  1:00 PM Rudi CocoLathan, Daija Routson M, [place] wrote: Patient called and is running 6 minutes late.  Pt. Caught in traffic let pt. Know about late policy  Route to department's PEC pool.

## 2017-09-03 DIAGNOSIS — Z1211 Encounter for screening for malignant neoplasm of colon: Secondary | ICD-10-CM | POA: Diagnosis not present

## 2017-09-03 DIAGNOSIS — K648 Other hemorrhoids: Secondary | ICD-10-CM | POA: Diagnosis not present

## 2017-09-03 LAB — HM COLONOSCOPY

## 2017-09-14 DIAGNOSIS — D181 Lymphangioma, any site: Secondary | ICD-10-CM | POA: Diagnosis not present

## 2017-09-14 DIAGNOSIS — L570 Actinic keratosis: Secondary | ICD-10-CM | POA: Diagnosis not present

## 2017-10-13 DIAGNOSIS — H6983 Other specified disorders of Eustachian tube, bilateral: Secondary | ICD-10-CM | POA: Diagnosis not present

## 2017-11-26 DIAGNOSIS — H903 Sensorineural hearing loss, bilateral: Secondary | ICD-10-CM | POA: Diagnosis not present

## 2018-04-04 ENCOUNTER — Encounter: Payer: Self-pay | Admitting: Family Medicine

## 2018-04-04 ENCOUNTER — Ambulatory Visit: Payer: BLUE CROSS/BLUE SHIELD | Admitting: Family Medicine

## 2018-04-04 VITALS — BP 127/85 | HR 67 | Temp 98.2°F | Wt 197.1 lb

## 2018-04-04 DIAGNOSIS — R519 Headache, unspecified: Secondary | ICD-10-CM

## 2018-04-04 DIAGNOSIS — R51 Headache: Secondary | ICD-10-CM

## 2018-04-04 DIAGNOSIS — M791 Myalgia, unspecified site: Secondary | ICD-10-CM | POA: Diagnosis not present

## 2018-04-04 DIAGNOSIS — R300 Dysuria: Secondary | ICD-10-CM | POA: Diagnosis not present

## 2018-04-04 LAB — UA/M W/RFLX CULTURE, ROUTINE
Bilirubin, UA: NEGATIVE
Glucose, UA: NEGATIVE
KETONES UA: NEGATIVE
Leukocytes, UA: NEGATIVE
NITRITE UA: NEGATIVE
PH UA: 5 (ref 5.0–7.5)
Protein, UA: NEGATIVE
RBC, UA: NEGATIVE
Specific Gravity, UA: >1.03 — ABNORMAL HIGH (ref 1.005–1.030)
UUROB: 0.2 mg/dL (ref 0.2–1.0)

## 2018-04-04 NOTE — Progress Notes (Signed)
BP 127/85 (BP Location: Left Arm, Patient Position: Sitting, Cuff Size: Normal)   Pulse 67   Temp 98.2 F (36.8 C)   Wt 197 lb 1 oz (89.4 kg)   LMP  (LMP Unknown)   SpO2 96%   BMI 32.79 kg/m    Subjective:    Patient ID: Stacie Bell, female    DOB: 28-Jul-1961, 57 y.o.   MRN: [id number]  HPI: Stacie Bell is a 57 y.o. female  Chief Complaint  Patient presents with  . Urinary Frequency    Back pain, and abnormal urine smell   Low back aching, discolored urine, abnormal odor, and dysuria the past few days to a week. Denies fevers, chills, N/V/D. Has had UTIs in the past and states they felt like this.   Found a tick 4 days ago and 2 months ago. When asked, does note some headaches and myalgias intermittently. No rashes, fevers, joint pains.   Past Medical History:  Diagnosis Date  . CIN I (cervical intraepithelial neoplasia I) 07/2012  . 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 Hyperlipidemia   . Hypertension    Social History   Socioeconomic History  . Marital status: Married    Spouse name: Not on file  . Number of children: Not on file  . Years of education: Not on file  . Highest education level: Not on file  Occupational History  . Not on file  Social Needs  . Financial resource strain: Not on file  . Food insecurity:    Worry: Not on file    Inability: Not on file  . Transportation needs:    Medical: Not on file    Non-medical: Not on file  Tobacco Use  . Smoking status: Never Smoker  . Smokeless tobacco: Never Used  Substance and Sexual Activity  . Alcohol use: Yes    Alcohol/week: 2.0 standard drinks    Types: 2 Standard drinks or equivalent per week    Comment: socially  . Drug use: No  . Sexual activity: Not Currently    Partners: Male  Lifestyle  . Physical activity:    Days per week: Not on file    Minutes per session: Not on file  . Stress: Not on file  Relationships  . Social connections:    Talks on phone: Not on file    Gets  together: Not on file    Attends religious service: Not on file    Active member of club or organization: Not on file    Attends meetings of clubs or organizations: Not on file    Relationship status: Not on file  . Intimate partner violence:    Fear of current or ex partner: Not on file    Emotionally abused: Not on file    Physically abused: Not on file    Forced sexual activity: Not on file  Other Topics Concern  . Not on file  Social History Narrative  . Not on file    Relevant past medical, surgical, family and social history reviewed and updated as indicated. Interim medical history since our last visit reviewed. Allergies and medications reviewed and updated.  Review of Systems  Per HPI unless specifically indicated above     Objective:    BP 127/85 (BP Location: Left Arm, Patient Position: Sitting, Cuff Size: Normal)   Pulse 67   Temp 98.2 F (36.8 C)   Wt 197 lb 1 oz (89.4 kg)   LMP  (LMP Unknown)  SpO2 96%   BMI 32.79 kg/m   Wt Readings from Last 3 Encounters:  04/04/18 197 lb 1 oz (89.4 kg)  08/20/17 193 lb 12.8 oz (87.9 kg)  08/16/17 191 lb (86.6 kg)    Physical Exam  Constitutional: She is oriented to person, place, and time. She appears well-developed and well-nourished. No distress.  HENT:  Head: Atraumatic.  Eyes: Conjunctivae and EOM are normal.  Neck: Normal range of motion. Neck supple.  Cardiovascular: Normal rate, regular rhythm and normal heart sounds.  Pulmonary/Chest: Effort normal and breath sounds normal.  Abdominal: Soft. Bowel sounds are normal. There is no tenderness.  Musculoskeletal: Normal range of motion. She exhibits no tenderness (No cva ttp b/l).  Neurological: She is alert and oriented to person, place, and time.  Skin: Skin is warm and dry.  Psychiatric: She has a normal mood and affect. Her behavior is normal.  Nursing note and vitals reviewed.   Results for orders placed or performed in visit on 04/04/18  UA/M w/rflx  Culture, Routine  Result Value Ref Range   Specific Gravity, UA >1.030 (H) 1.005 - 1.030   pH, UA 5.0 5.0 - 7.5   Color, UA Yellow Yellow   Appearance Ur Hazy (A) Clear   Leukocytes, UA Negative Negative   Protein, UA Negative Negative/Trace   Glucose, UA Negative Negative   Ketones, UA Negative Negative   RBC, UA Negative Negative   Bilirubin, UA Negative Negative   Urobilinogen, Ur 0.2 0.2 - 1.0 mg/dL   Nitrite, UA Negative Negative  Rocky mtn spotted fvr abs pnl(IgG+IgM)  Result Value Ref Range   RMSF IgG Negative Negative   RMSF IgM 0.28 0.00 - 0.89 index  Lyme Ab/Western Blot Reflex  Result Value Ref Range   Lyme IgG/IgM Ab <0.91 0.00 - 0.90 ISR   LYME DISEASE AB, QUANT, IGM <0.80 0.00 - 0.79 index      Assessment & Plan:   Problem List Items Addressed This Visit    None    Visit Diagnoses    Dysuria    -  Primary   U/A neg for UTI, encouraged probiotics, good hydration. F/u for repeat urine if sxs worsening or not improving.    Relevant Orders   UA/M w/rflx Culture, Routine (Completed)   Nonintractable headache, unspecified chronicity pattern, unspecified headache type       Intermittent, discussed good hydration, NSAIDs prn. Will check tick labs today for r/o. F/u if not improving   Relevant Orders   Rocky mtn spotted fvr abs pnl(IgG+IgM) (Completed)   Lyme Ab/Western Blot Reflex (Completed)   Myalgia       Checking tick labs. Push fluids, epsom salt soaks, NSAIDs and tylenol prn. F/u if not improving   Relevant Orders   Rocky mtn spotted fvr abs pnl(IgG+IgM) (Completed)   Lyme Ab/Western Blot Reflex (Completed)       Follow up plan: Return if symptoms worsen or fail to improve.

## 2018-04-06 LAB — LYME AB/WESTERN BLOT REFLEX
LYME DISEASE AB, QUANT, IGM: <0.8 index (ref 0.00–0.79)
Lyme IgG/IgM Ab: <0.91 {ISR} (ref 0.00–0.90)

## 2018-04-06 LAB — ROCKY MTN SPOTTED FVR ABS PNL(IGG+IGM)
RMSF IGG: NEGATIVE
RMSF IGM: 0.28 {index} (ref 0.00–0.89)

## 2018-04-08 NOTE — Patient Instructions (Signed)
Follow up as needed

## 2018-05-20 ENCOUNTER — Encounter: Payer: Self-pay | Admitting: Obstetrics and Gynecology

## 2018-05-20 DIAGNOSIS — Z1231 Encounter for screening mammogram for malignant neoplasm of breast: Secondary | ICD-10-CM | POA: Diagnosis not present

## 2018-06-10 DIAGNOSIS — H11421 Conjunctival edema, right eye: Secondary | ICD-10-CM | POA: Diagnosis not present

## 2018-07-01 ENCOUNTER — Telehealth: Payer: Self-pay

## 2018-07-01 DIAGNOSIS — R921 Mammographic calcification found on diagnostic imaging of breast: Secondary | ICD-10-CM

## 2018-07-01 DIAGNOSIS — R928 Other abnormal and inconclusive findings on diagnostic imaging of breast: Secondary | ICD-10-CM

## 2018-07-01 NOTE — Telephone Encounter (Signed)
Pt calling requesting mammo results from [facility]

## 2018-07-04 NOTE — Telephone Encounter (Signed)
Pt calling again for results of mammogram from 05/2018.  [phone]

## 2018-07-04 NOTE — Telephone Encounter (Signed)
Pt says it was a mobile unit that came to her job on 9/20. They told her they would send a copy of her results to [place]. She was just wondering if they did because last year they told her the same thing and didn't send papers in like she was told. She states she is getting another copy of her results sometime this week, since she missed the first results they sent to her.

## 2018-07-04 NOTE — Telephone Encounter (Signed)
Called pt to check if its an old msg before I spoke to her this morning, no answer, left vm to return call.

## 2018-07-04 NOTE — Telephone Encounter (Signed)
She said since she missed the results she was calling to check if we had them. She reached them already to send her another copy which shes getting this week hopefully, but did not say if she was giving [place] a copy of it. Would you like for me to call her and ask to bring a copy of it to keep in her record?

## 2018-07-04 NOTE — Telephone Encounter (Signed)
I don't have mammo results. Where did pt have it done? Need to get copy sent to me. Thx.

## 2018-07-04 NOTE — Telephone Encounter (Signed)
Pt received mammogram results - right breast calcifications.  Has appt 12/23rd.  Does she need to be seen sooner?  [phone]

## 2018-07-04 NOTE — Telephone Encounter (Signed)
No, they would have contacted her if abnormal. So, as long as she has a copy. Thx.

## 2018-07-04 NOTE — Telephone Encounter (Signed)
Pt aware of abn mammo RT breast with calcifications, needing addl views. Order placed, Harriett Sine to sched. Pt had mammo done on mobile unit at work [employer]). Has CD of images to take with her to appt. Will f/u with results.

## 2018-07-04 NOTE — Telephone Encounter (Signed)
Ok. Is she going to bring [place] a copy?

## 2018-07-05 NOTE — Telephone Encounter (Signed)
Please advise 

## 2018-07-19 ENCOUNTER — Encounter: Payer: Self-pay | Admitting: Obstetrics and Gynecology

## 2018-07-19 ENCOUNTER — Ambulatory Visit
Admission: RE | Admit: 2018-07-19 | Discharge: 2018-07-19 | Disposition: A | Discharge status: Home / Self Care | Payer: BLUE CROSS/BLUE SHIELD | Source: Ambulatory Visit | Attending: Obstetrics and Gynecology | Admitting: Obstetrics and Gynecology

## 2018-07-19 DIAGNOSIS — R921 Mammographic calcification found on diagnostic imaging of breast: Secondary | ICD-10-CM | POA: Insufficient documentation

## 2018-07-19 DIAGNOSIS — R928 Other abnormal and inconclusive findings on diagnostic imaging of breast: Secondary | ICD-10-CM | POA: Insufficient documentation

## 2018-07-19 IMAGING — MG DIGITAL DIAGNOSTIC UNILATERAL RIGHT MAMMOGRAM WITH TOMO AND CAD
4 series · 4 of 8 positions shown · non-contrast
Comparison: Previous exam(s).

CLINICAL DATA: Right breast upper outer quadrant calcifications
seen on patient's most recent screening mammography.

EXAM:
DIGITAL DIAGNOSTIC UNILATERAL RIGHT MAMMOGRAM WITH CAD AND TOMO

[R CC]
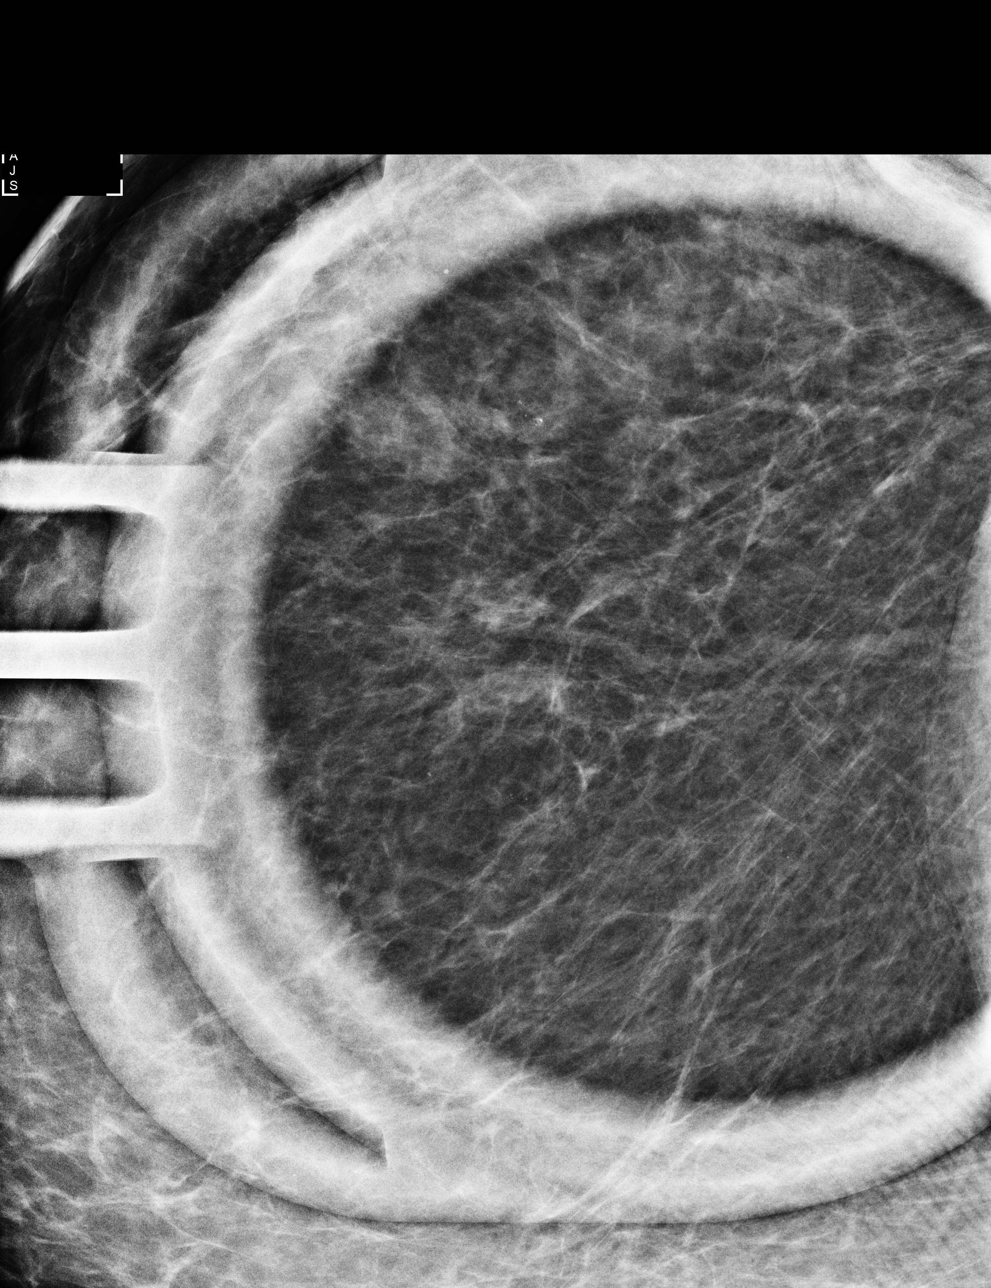

[R ML]
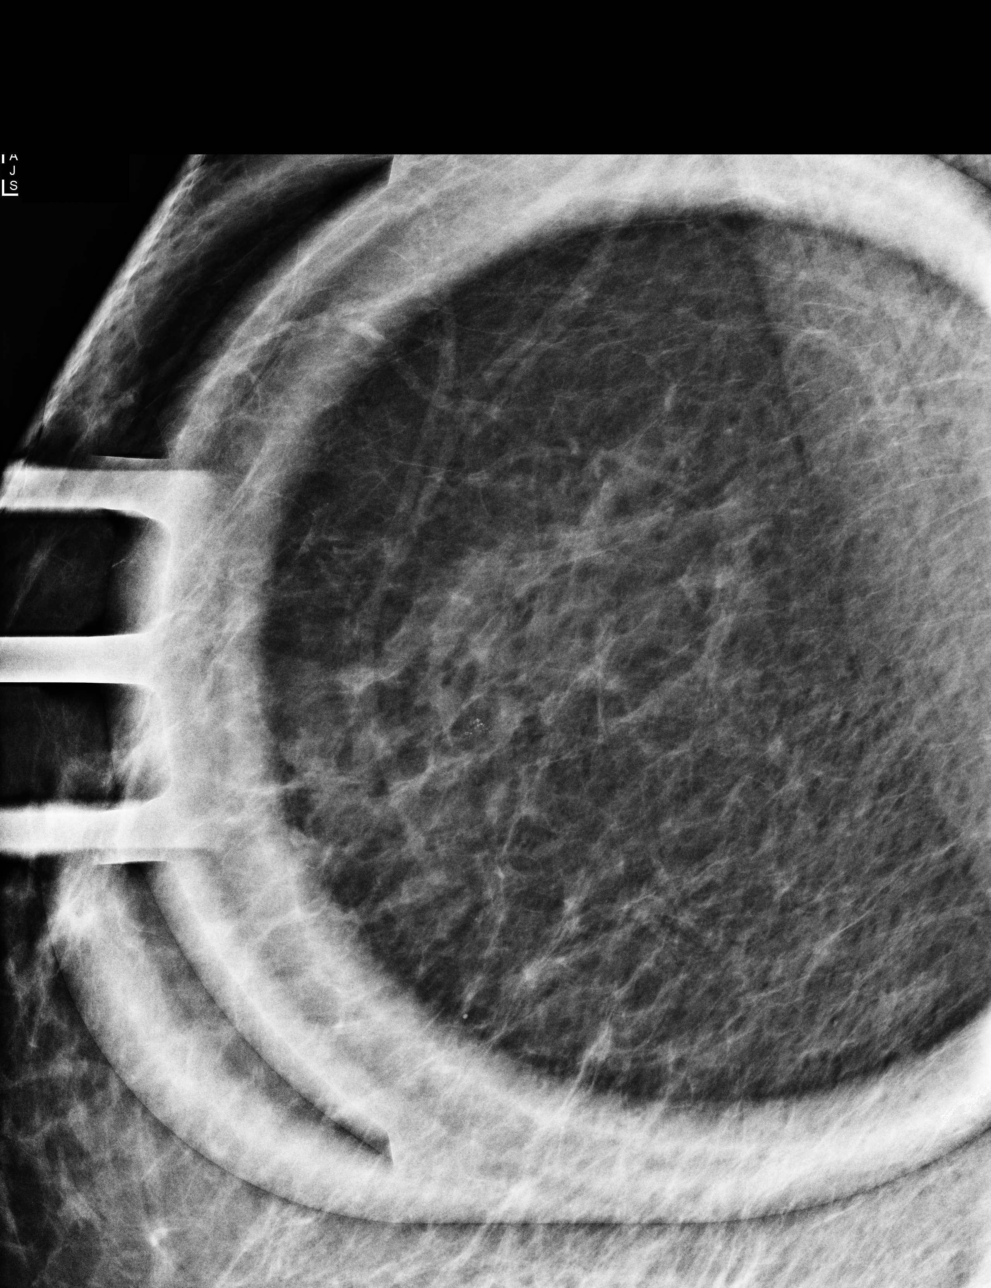

[R ML synth-2D]
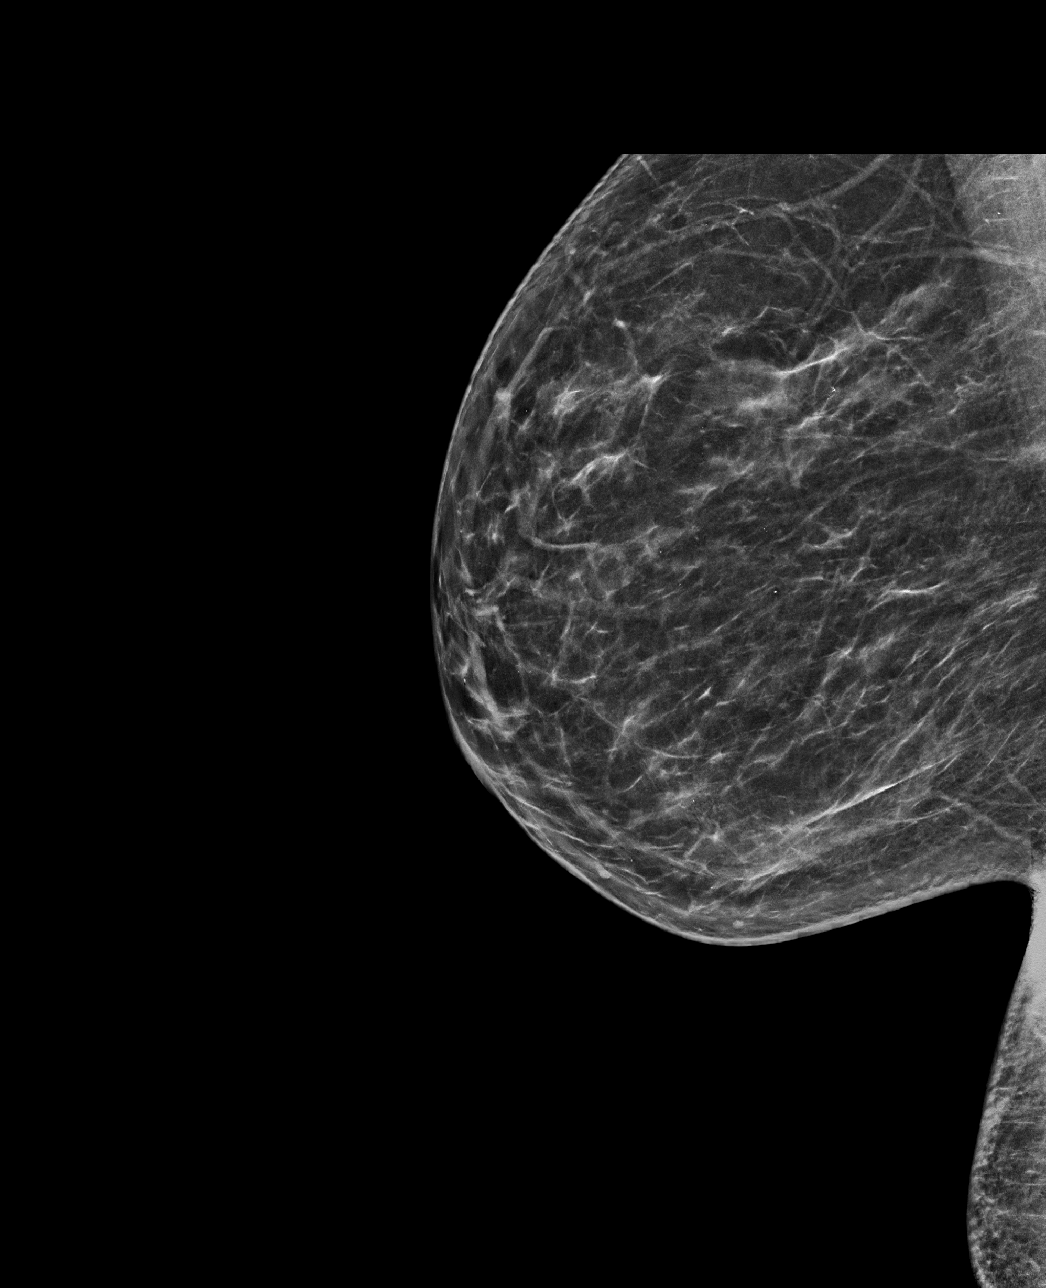

[R ML tomo · tomo slice 37/72.0]
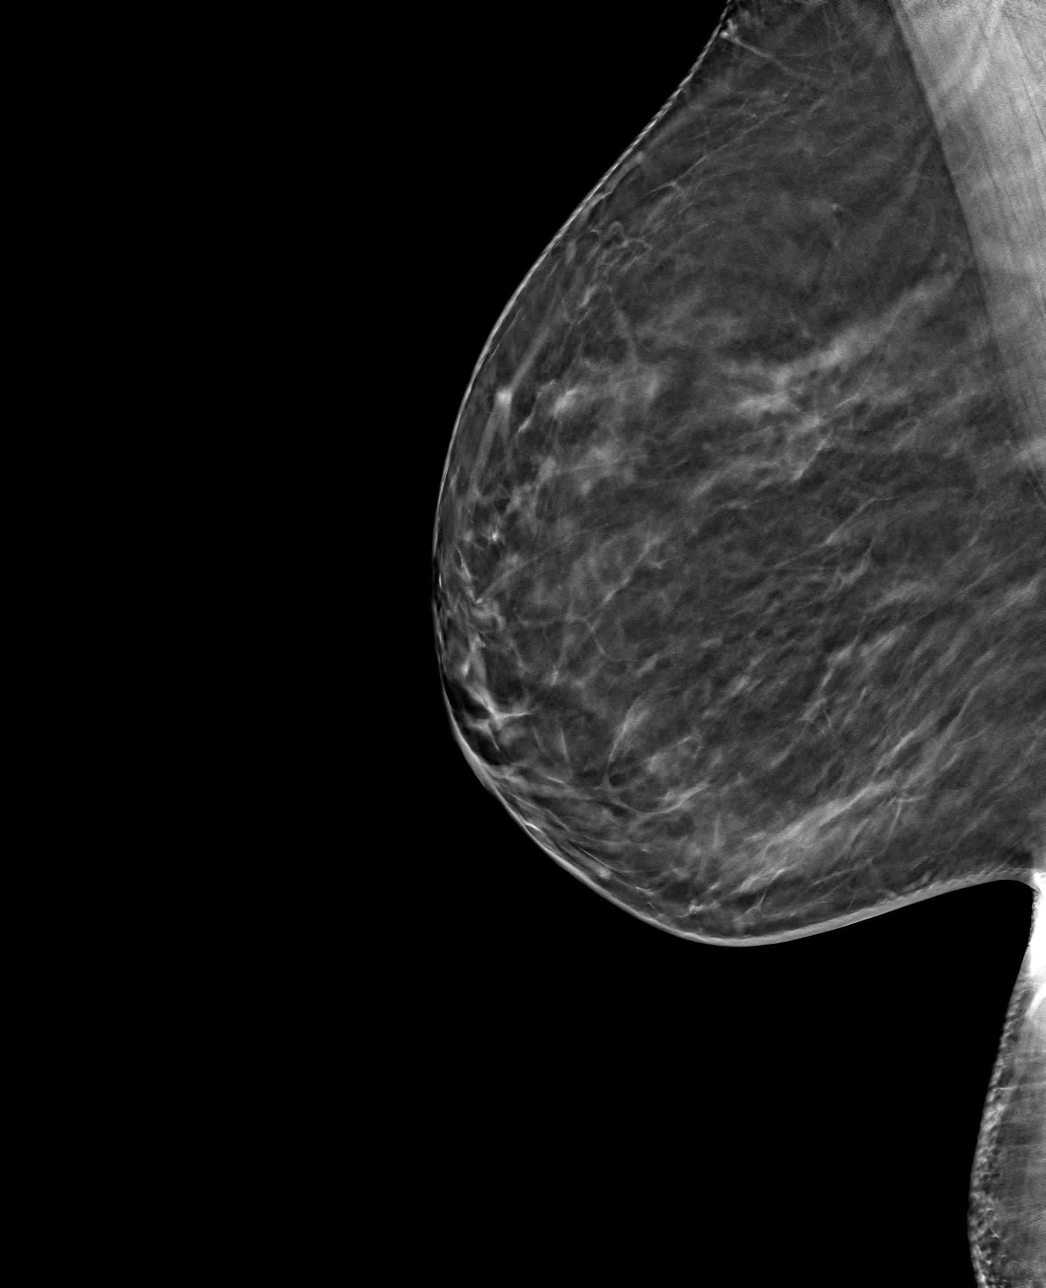

[4 of 8 positions shown; findings below may reference images not displayed]

ACR Breast Density Category c: The breast tissue is heterogeneously
dense, which may obscure small masses.
FINDINGS: Additional mammographic views of the right breast demonstrate a
group of benign-appearing calcifications in the right breast upper
outer quadrant, middle to posterior depth. These calcifications have
been stable dating back to [VY] and therefore are consider benign.

Mammographic images were processed with CAD.
IMPRESSION: Benign right breast calcifications.

No mammographic evidence of malignancy in the right breast.

RECOMMENDATION:
Screening mammogram in one year.(Code:[VY])

I have discussed the findings and recommendations with the patient.
Results were also provided in writing at the conclusion of the
visit. If applicable, a reminder letter will be sent to the patient
regarding the next appointment.

BI-RADS CATEGORY  2: Benign.

## 2018-08-02 ENCOUNTER — Other Ambulatory Visit (HOSPITAL_COMMUNITY)
Admission: RE | Admit: 2018-08-02 | Discharge: 2018-08-02 | Disposition: A | Discharge status: Home / Self Care | Payer: BLUE CROSS/BLUE SHIELD | Source: Ambulatory Visit | Attending: Obstetrics and Gynecology | Admitting: Obstetrics and Gynecology

## 2018-08-02 ENCOUNTER — Encounter: Payer: Self-pay | Admitting: Obstetrics and Gynecology

## 2018-08-02 ENCOUNTER — Ambulatory Visit (INDEPENDENT_AMBULATORY_CARE_PROVIDER_SITE_OTHER): Payer: BLUE CROSS/BLUE SHIELD | Admitting: Obstetrics and Gynecology

## 2018-08-02 VITALS — BP 128/82 | HR 68 | Ht 65.0 in | Wt 205.0 lb

## 2018-08-02 DIAGNOSIS — R87810 Cervical high risk human papillomavirus (HPV) DNA test positive: Secondary | ICD-10-CM | POA: Diagnosis not present

## 2018-08-02 DIAGNOSIS — N760 Acute vaginitis: Secondary | ICD-10-CM

## 2018-08-02 DIAGNOSIS — Z124 Encounter for screening for malignant neoplasm of cervix: Secondary | ICD-10-CM

## 2018-08-02 DIAGNOSIS — Z01419 Encounter for gynecological examination (general) (routine) without abnormal findings: Secondary | ICD-10-CM

## 2018-08-02 DIAGNOSIS — K644 Residual hemorrhoidal skin tags: Secondary | ICD-10-CM

## 2018-08-02 DIAGNOSIS — Z1151 Encounter for screening for human papillomavirus (HPV): Secondary | ICD-10-CM | POA: Diagnosis not present

## 2018-08-02 DIAGNOSIS — Z1239 Encounter for other screening for malignant neoplasm of breast: Secondary | ICD-10-CM

## 2018-08-02 MED ORDER — CLOTRIMAZOLE-BETAMETHASONE 1-0.05 % EX CREA
TOPICAL_CREAM | CUTANEOUS | 0 refills | Status: DC
Start: 2018-08-02 — End: 2019-08-01

## 2018-08-02 NOTE — Patient Instructions (Signed)
I value your feedback and entrusting us with your care. If you get a  patient survey, I would appreciate you taking the time to let us know about your experience today. Thank you! 

## 2018-08-02 NOTE — Progress Notes (Signed)
PCP: Gabriel Cirri, NP   Chief Complaint  Patient presents with  . Gynecologic Exam    HPI:      [name] is a 57 y.o. G2P1001 who LMP was No LMP recorded (lmp unknown). Patient is postmenopausal., presents today for her annual examination.  Her menses are absent due to menopause. She does not have intermenstrual bleeding. She does not have vasomotor sx.  Sex activity: single partner, contraception - post menopausal status. She does have vaginal dryness if doesn't use lubricant. Has vaginal itching near mons that comes and goes. Pt uses dove soap/no dryer sheets. No meds to treat.   Last Pap: 05/04/17 Results were: no abnormalities /NEG HPV DNA. Hx of pos HPV DNA 2016 and 2017. Repeat pap today. Hx of STDs: HPV  Last mammogram: 07/19/18  Results were: normal--routine follow-up in 12 months. Usually does through work but needed addl images which were done at [facility]. Wants to do at [facility] next yr. There is a FH of breast cancer in her mat aunt and PGGM, genetic testing not indicated for pt. There is no FH of ovarian cancer. The patient does do self-breast exams.  Tobacco use: The patient denies current or previous tobacco use. Alcohol use: none Exercise: moderately active  She does get adequate calcium and Vitamin D in her diet.  Diagnosed with sleep apnea last yr with sleep study after neg labs. Insurance won't pay for CPAP, so pt not doing tx.  Colonoscopy: 08/2017 without abn. Repeat due after 10 yrs. FH colon cancer in [facility]. Pt with ext hem tag that is bothersome. Would like it removed.    Past Medical History:  Diagnosis Date  . CIN I (cervical intraepithelial neoplasia I) 07/2012  . 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 Hyperlipidemia   . Hypertension   . Sleep apnea     Past Surgical History:  Procedure Laterality Date  . arm muscle repair    . TONSILLECTOMY      Family History  Problem Relation Age of Onset  . Diabetes Mother   . Heart disease Mother   .  Hypertension Mother   . Clotting disorder Father   . Bladder Cancer Father 56  . Hypertension Father   . Breast cancer Maternal Aunt 60  . Colon cancer Maternal Grandmother 4  . Hypertension Brother     Social History   Socioeconomic History  . Marital status: Married    Spouse name: Not on file  . Number of children: Not on file  . Years of education: Not on file  . Highest education level: Not on file  Occupational History  . Not on file  Social Needs  . Financial resource strain: Not on file  . Food insecurity:    Worry: Not on file    Inability: Not on file  . Transportation needs:    Medical: Not on file    Non-medical: Not on file  Tobacco Use  . Smoking status: Never Smoker  . Smokeless tobacco: Never Used  Substance and Sexual Activity  . Alcohol use: Yes    Alcohol/week: 2.0 standard drinks    Types: 2 Standard drinks or equivalent per week    Comment: socially  . Drug use: No  . Sexual activity: Yes    Partners: Male    Birth control/protection: Post-menopausal  Lifestyle  . Physical activity:    Days per week: Not on file    Minutes per session: Not on file  . Stress:  Not on file  Relationships  . Social connections:    Talks on phone: Not on file    Gets together: Not on file    Attends religious service: Not on file    Active member of club or organization: Not on file    Attends meetings of clubs or organizations: Not on file    Relationship status: Not on file  . Intimate partner violence:    Fear of current or ex partner: Not on file    Emotionally abused: Not on file    Physically abused: Not on file    Forced sexual activity: Not on file  Other Topics Concern  . Not on file  Social History Narrative  . Not on file    Current Meds  Medication Sig  . aspirin EC 81 MG tablet Take 81 mg by mouth daily.  Marland Kitchen. BIOTIN PO Take 1 tablet by mouth daily.   Marland Kitchen. CALCIUM PO Take 1 tablet by mouth daily.   . Multiple Vitamin (MULTIVITAMIN) capsule  Take 1 capsule by mouth daily.  . Omega-3 Fatty Acids (FISH OIL PO) Take by mouth daily.  . vitamin B-12 (CYANOCOBALAMIN) 1000 MCG tablet Take 1,000 mcg by mouth daily.      ROS:  Review of Systems  Constitutional: Positive for fatigue. Negative for fever and unexpected weight change.  Respiratory: Negative for cough, shortness of breath and wheezing.   Cardiovascular: Negative for chest pain, palpitations and leg swelling.  Gastrointestinal: Negative for blood in stool, constipation, diarrhea, nausea and vomiting.  Endocrine: Negative for cold intolerance, heat intolerance and polyuria.  Genitourinary: Positive for dyspareunia and frequency. Negative for dysuria, flank pain, genital sores, hematuria, menstrual problem, pelvic pain, urgency, vaginal bleeding, vaginal discharge and vaginal pain.  Musculoskeletal: Positive for arthralgias. Negative for back pain, joint swelling and myalgias.  Skin: Negative for rash.  Neurological: Negative for dizziness, syncope, light-headedness, numbness and headaches.  Hematological: Negative for adenopathy.  Psychiatric/Behavioral: Negative for agitation, confusion, sleep disturbance and suicidal ideas. The patient is not nervous/anxious.      Objective: BP 128/82   Pulse 68   Ht 5\' 5"  (1.651 m)   Wt 205 lb (93 kg)   LMP  (LMP Unknown)   BMI 34.11 kg/m    Physical Exam  Constitutional: She is oriented to person, place, and time. She appears well-developed and well-nourished.  Genitourinary: Vagina normal and uterus normal. There is no rash or tenderness on the right labia. There is no rash or tenderness on the left labia.    No erythema or tenderness in the vagina. No vaginal discharge found. Right adnexum does not display mass and does not display tenderness. Left adnexum does not display mass and does not display tenderness. Cervix does not exhibit motion tenderness or polyp. Uterus is not enlarged or tender. Rectal exam shows external  hemorrhoid. Rectal exam shows no fissure, no tenderness and guaiac negative stool.  Neck: Normal range of motion. No thyromegaly present.  Cardiovascular: Normal rate, regular rhythm and normal heart sounds.  No murmur heard. Pulmonary/Chest: Effort normal and breath sounds normal. Right breast exhibits no mass, no nipple discharge, no skin change and no tenderness. Left breast exhibits no mass, no nipple discharge, no skin change and no tenderness.  Abdominal: Soft. There is no tenderness. There is no guarding.  Musculoskeletal: Normal range of motion.  Neurological: She is alert and oriented to person, place, and time. No cranial nerve deficit.  Psychiatric: She has a normal mood and  affect. Her behavior is normal.  Vitals reviewed.   Assessment/Plan:  Encounter for annual routine gynecological examination  Cervical cancer screening - Plan: Cytology - PAP  Screening for HPV (human papillomavirus) - Plan: Cytology - PAP  Cervical high risk human papillomavirus (HPV) DNA test positive - Repeat pap today. Will call with results.  - Plan: Cytology - PAP  Screening for breast cancer - Pt current on mammo - Plan: MM 3D SCREEN BREAST BILATERAL  Acute vaginitis - Fissure ext vagina. Rx lotrisone crm. Dove sens skin soap/keep dry. F/u prn.  - Plan: clotrimazole-betamethasone (LOTRISONE) cream  Residual hemorrhoid tags - Pt to f/u with GI for tx options.           GYN counsel mammography screening, menopause, adequate intake of calcium and vitamin D, diet and exercise    F/U  Return in about 1 year (around 08/03/2019).  Samatha Anspach B. Koren Plyler, PA-C 08/02/2018 3:28 PM

## 2018-08-04 LAB — CYTOLOGY - PAP
ADEQUACY: ABSENT
DIAGNOSIS: NEGATIVE
HPV: NOT DETECTED

## 2018-08-05 NOTE — Progress Notes (Signed)
Pls let pt know pap normal. Thx

## 2018-08-05 NOTE — Progress Notes (Signed)
Pt aware.

## 2018-09-13 DIAGNOSIS — D2262 Melanocytic nevi of left upper limb, including shoulder: Secondary | ICD-10-CM | POA: Diagnosis not present

## 2018-09-13 DIAGNOSIS — D225 Melanocytic nevi of trunk: Secondary | ICD-10-CM | POA: Diagnosis not present

## 2018-09-13 DIAGNOSIS — X32XXXA Exposure to sunlight, initial encounter: Secondary | ICD-10-CM | POA: Diagnosis not present

## 2018-09-13 DIAGNOSIS — L570 Actinic keratosis: Secondary | ICD-10-CM | POA: Diagnosis not present

## 2018-09-13 DIAGNOSIS — D2261 Melanocytic nevi of right upper limb, including shoulder: Secondary | ICD-10-CM | POA: Diagnosis not present

## 2018-09-13 DIAGNOSIS — D2272 Melanocytic nevi of left lower limb, including hip: Secondary | ICD-10-CM | POA: Diagnosis not present
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
## 2019-05-28 DIAGNOSIS — Z23 Encounter for immunization: Secondary | ICD-10-CM | POA: Diagnosis not present

## 2019-05-29 ENCOUNTER — Ambulatory Visit: Payer: Self-pay

## 2019-05-29 ENCOUNTER — Other Ambulatory Visit: Payer: Self-pay

## 2019-05-29 VITALS — BP 120/66 | HR 70 | Resp 18 | Ht 65.0 in | Wt 199.0 lb

## 2019-05-29 DIAGNOSIS — Z23 Encounter for immunization: Secondary | ICD-10-CM

## 2019-05-29 DIAGNOSIS — Z008 Encounter for other general examination: Secondary | ICD-10-CM

## 2019-05-29 LAB — POCT LIPID PANEL
HDL: 47
LDL: 122
Non-HDL: 161
POC Glucose: 96 mg/dl (ref 70–99)
TC/HDL: 4.4
TC: 207
TRG: 196

## 2019-05-29 NOTE — Patient Instructions (Signed)
Influenza (Flu) Vaccine (Inactivated or Recombinant): What You Need to Know 1. Why get vaccinated? Influenza vaccine can prevent influenza (flu). Flu is a contagious disease that spreads around the [place] every year, usually between October and May. Anyone can get the flu, but it is more dangerous for some people. Infants and young children, people 58 years of age and older, pregnant women, and people with certain health conditions or a weakened immune system are at greatest risk of flu complications. Pneumonia, bronchitis, sinus infections and ear infections are examples of flu-related complications. If you have a medical condition, such as heart disease, cancer or diabetes, flu can make it worse. Flu can cause fever and chills, sore throat, muscle aches, fatigue, cough, headache, and runny or stuffy nose. Some people may have vomiting and diarrhea, though this is more common in children than adults. Each year thousands of people in the [place] die from flu, and many more are hospitalized. Flu vaccine prevents millions of illnesses and flu-related visits to the doctor each year. 2. Influenza vaccine CDC recommends everyone 6 months of age and older get vaccinated every flu season. Children 6 months through 8 years of age may need 2 doses during a single flu season. Everyone else needs only 1 dose each flu season. It takes about 2 weeks for protection to develop after vaccination. There are many flu viruses, and they are always changing. Each year a new flu vaccine is made to protect against three or four viruses that are likely to cause disease in the upcoming flu season. Even when the vaccine doesn't exactly match these viruses, it may still provide some protection. Influenza vaccine does not cause flu. Influenza vaccine may be given at the same time as other vaccines. 3. Talk with your health care provider Tell your vaccine provider if the person getting the vaccine:  Has had an  allergic reaction after a previous dose of influenza vaccine, or has any severe, life-threatening allergies.  Has ever had Guillain-Barr Syndrome (also called GBS). In some cases, your health care provider may decide to postpone influenza vaccination to a future visit. People with minor illnesses, such as a cold, may be vaccinated. People who are moderately or severely ill should usually wait until they recover before getting influenza vaccine. Your health care provider can give you more information. 4. Risks of a vaccine reaction  Soreness, redness, and swelling where shot is given, fever, muscle aches, and headache can happen after influenza vaccine.  There may be a very small increased risk of Guillain-Barr Syndrome (GBS) after inactivated influenza vaccine (the flu shot). Young children who get the flu shot along with pneumococcal vaccine (PCV13), and/or DTaP vaccine at the same time might be slightly more likely to have a seizure caused by fever. Tell your health care provider if a child who is getting flu vaccine has ever had a seizure. People sometimes faint after medical procedures, including vaccination. Tell your provider if you feel dizzy or have vision changes or ringing in the ears. As with any medicine, there is a very remote chance of a vaccine causing a severe allergic reaction, other serious injury, or death. 5. What if there is a serious problem? An allergic reaction could occur after the vaccinated person leaves the clinic. If you see signs of a severe allergic reaction (hives, swelling of the face and throat, difficulty breathing, a fast heartbeat, dizziness, or weakness), call 9-1-1 and get the person to the nearest hospital. For other signs that   concern you, call your health care provider. Adverse reactions should be reported to the Vaccine Adverse Event Reporting System (VAERS). Your health care provider will usually file this report, or you can do it yourself. Visit the  VAERS website at www.vaers.hhs.gov or call 1-800-822-7967.VAERS is only for reporting reactions, and VAERS staff do not give medical advice. 6. The National Vaccine Injury Compensation Program The National Vaccine Injury Compensation Program (VICP) is a federal program that was created to compensate people who may have been injured by certain vaccines. Visit the VICP website at www.hrsa.gov/vaccinecompensation or call 1-800-338-2382 to learn about the program and about filing a claim. There is a time limit to file a claim for compensation. 7. How can I learn more?  Ask your healthcare provider.  Call your local or state health department.  Contact the Centers for Disease Control and Prevention (CDC): ? Call 1-800-232-4636 (1-800-CDC-INFO) or ? Visit CDC's www.cdc.gov/flu Vaccine Information Statement (Interim) Inactivated Influenza Vaccine (04/14/2018) This information is not intended to replace advice given to you by your health care provider. Make sure you discuss any questions you have with your health care provider. Document Released: 06/11/2006 Document Revised: 12/06/2018 Document Reviewed: 04/18/2018 Elsevier Patient Education  2020 Elsevier Inc. Preventing Influenza, Adult Influenza, more commonly known as "the flu," is a viral infection that mainly affects the respiratory tract. The respiratory tract includes structures that help you breathe, such as the lungs, nose, and throat. The flu causes many common cold symptoms, as well as a high fever and body aches. The flu spreads easily from person to person (is contagious). The flu is most common from December through March. This is called flu season.You can catch the flu virus by:  Breathing in droplets from an infected person's cough or sneeze.  Touching something that was recently contaminated with the virus and then touching your mouth, nose, or eyes. What can I do to lower my risk?        You can decrease your risk of getting  the flu by:  Getting a flu shot (influenza vaccination) every year. This is the best way to prevent the flu. A flu shot is recommended for everyone age 6 months and older. ? It is best to get a flu shot in the fall, as soon as it is available. Getting a flu shot during winter or spring instead is still a good idea. Flu season can last into early spring. ? Preventing the flu through vaccination requires getting a new flu shot every year. This is because the flu virus changes slightly (mutates) from one year to the next. Even if a flu shot does not completely protect you from all flu virus mutations, it can reduce the severity of your illness and prevent dangerous complications of the flu. ? If you are pregnant, you can and should get a flu shot. ? If you have had a reaction to the shot in the past or if you are allergic to eggs, check with your health care provider before getting a flu shot. ? Sometimes the vaccine is available as a nasal spray. In some years, the nasal spray has not been as effective against the flu virus. Check with your health care provider if you have questions about this.  Practicing good health habits. This is especially important during flu season. ? Avoid contact with people who are sick with flu or cold symptoms. ? Wash your hands with soap and water often. If soap and water are not available, use alcohol-based   hand sanitizer. ? Avoid touching your hands to your face, especially when you have not washed your hands recently. ? Use a disinfectant to clean surfaces at home and at work that may be contaminated with the flu virus. ? Keep your body's disease-fighting system (immune system) in good shape by eating a healthy diet, drinking plenty of fluids, getting enough sleep, and exercising regularly. If you do get the flu, avoid spreading it to others by:  Staying home until your symptoms have been gone for at least one day.  Covering your mouth and nose when you cough or  sneeze.  Avoiding close contact with others, especially babies and elderly people. Why are these changes important? Getting a flu shot and practicing good health habits protects you as well as other people. If you get the flu, your friends, family, and co-workers are also at risk of getting it, because it spreads so easily to others. Each year, about 2 out of every 10 people get the flu. Having the flu can lead to complications, such as pneumonia, ear infection, and sinus infection. The flu also can be deadly, especially for babies, people older than age 58, and people who have serious long-term diseases. How is this treated? Most people recover from the flu by resting at home and drinking plenty of fluids. However, a prescription antiviral medicine may reduce your flu symptoms and may make your flu go away sooner. This medicine must be started within a few days of getting flu symptoms. You can talk with your health care provider about whether you need an antiviral medicine. Antiviral medicine may be prescribed for people who are at risk for more serious flu symptoms. This includes people who:  Are older than age 58.  Are pregnant.  Have a condition that makes the flu worse or more dangerous. Where to find more information  Centers for Disease Control and Prevention: www.cdc.gov/flu/index.htm  Flu.gov: www.flu.gov/prevention-vaccination  American Academy of Family Physicians: familydoctor.org/familydoctor/en/kids/vaccines/preventing-the-flu.html Contact a health care provider if:  You have influenza and you develop new symptoms.  You have: ? Chest pain. ? Diarrhea. ? A fever.  Your cough gets worse, or you produce more mucus. Summary  The best way to prevent the flu is to get a flu shot every year in the fall.  Even if you get the flu after you have received the yearly vaccine, your flu may be milder and go away sooner because of your flu shot.  If you get the flu, antiviral  medicines that are started with a few days of symptoms may reduce your flu symptoms and may make your flu go away sooner.  You can also help prevent the flu by practicing good health habits. This information is not intended to replace advice given to you by your health care provider. Make sure you discuss any questions you have with your health care provider. Document Released: 09/01/2015 Document Revised: 07/30/2017 Document Reviewed: 04/25/2016 Elsevier Patient Education  2020 Elsevier Inc.  

## 2019-08-01 ENCOUNTER — Ambulatory Visit: Payer: Self-pay

## 2019-08-01 ENCOUNTER — Ambulatory Visit
Admission: EM | Admit: 2019-08-01 | Discharge: 2019-08-01 | Disposition: A | Discharge status: Home / Self Care | Payer: BLUE CROSS/BLUE SHIELD | Attending: Urgent Care | Admitting: Urgent Care

## 2019-08-01 ENCOUNTER — Encounter: Payer: Self-pay | Admitting: Emergency Medicine

## 2019-08-01 ENCOUNTER — Other Ambulatory Visit: Payer: Self-pay

## 2019-08-01 VITALS — BP 168/108 | HR 93 | Temp 98.2°F | Resp 16

## 2019-08-01 DIAGNOSIS — I10 Essential (primary) hypertension: Secondary | ICD-10-CM

## 2019-08-01 DIAGNOSIS — K219 Gastro-esophageal reflux disease without esophagitis: Secondary | ICD-10-CM

## 2019-08-01 DIAGNOSIS — R030 Elevated blood-pressure reading, without diagnosis of hypertension: Secondary | ICD-10-CM

## 2019-08-01 DIAGNOSIS — R0789 Other chest pain: Secondary | ICD-10-CM

## 2019-08-01 DIAGNOSIS — F419 Anxiety disorder, unspecified: Secondary | ICD-10-CM

## 2019-08-01 LAB — BASIC METABOLIC PANEL
Anion gap: 9 (ref 5–15)
BUN: 13 mg/dL (ref 6–20)
CO2: 24 mmol/L (ref 22–32)
Calcium: 9.4 mg/dL (ref 8.9–10.3)
Chloride: 104 mmol/L (ref 98–111)
Creatinine, Ser: 0.7 mg/dL (ref 0.44–1.00)
GFR calc Af Amer: >60 mL/min (ref >60)
GFR calc non Af Amer: >60 mL/min (ref >60)
Glucose, Bld: 93 mg/dL (ref 70–99)
Potassium: 4.1 mmol/L (ref 3.5–5.1)
Sodium: 137 mmol/L (ref 135–145)

## 2019-08-01 LAB — TROPONIN I (HIGH SENSITIVITY): Troponin I (High Sensitivity): 3 ng/L (ref <18)

## 2019-08-01 LAB — CBC
HCT: 41.3 % (ref 36.0–46.0)
Hemoglobin: 14.1 g/dL (ref 12.0–15.0)
MCH: 30.1 pg (ref 26.0–34.0)
MCHC: 34.1 g/dL (ref 30.0–36.0)
MCV: 88.1 fL (ref 80.0–100.0)
Platelets: 203 10*3/uL (ref 150–400)
RBC: 4.69 MIL/uL (ref 3.87–5.11)
RDW: 12.5 % (ref 11.5–15.5)
WBC: 8.2 10*3/uL (ref 4.0–10.5)
nRBC: 0 % (ref 0.0–0.2)

## 2019-08-01 MED ORDER — PANTOPRAZOLE SODIUM 40 MG PO TBEC: 40.0000 mg | DELAYED_RELEASE_TABLET | Freq: Every day | ORAL | 0 refills | Status: DC

## 2019-08-01 NOTE — Patient Instructions (Signed)
Preventing Hypertension Hypertension, commonly called high blood pressure, is when the force of blood pumping through the arteries is too strong. Arteries are blood vessels that carry blood from the heart throughout the body. Over time, hypertension can damage the arteries and decrease blood flow to important parts of the body, including the brain, heart, and kidneys. Often, hypertension does not cause symptoms until blood pressure is very high. For this reason, it is important to have your blood pressure checked on a regular basis. Hypertension can often be prevented with diet and lifestyle changes. If you already have hypertension, you can control it with diet and lifestyle changes, as well as medicine. What nutrition changes can be made? Maintain a healthy diet. This includes:  Eating less salt (sodium). Ask your health care provider how much sodium is safe for you to have. The general recommendation is to consume less than 1 tsp (2,300 mg) of sodium a day. ? Do not add salt to your food. ? Choose low-sodium options when grocery shopping and eating out.  Limiting fats in your diet. You can do this by eating low-fat or fat-free dairy products and by eating less red meat.  Eating more fruits, vegetables, and whole grains. Make a goal to eat: ? 1-2 cups of fresh fruits and vegetables each day. ? 3-4 servings of whole grains each day.  Avoiding foods and beverages that have added sugars.  Eating fish that contain healthy fats (omega-3 fatty acids), such as mackerel or salmon. If you need help putting together a healthy eating plan, try the DASH diet. This diet is high in fruits, vegetables, and whole grains. It is low in sodium, red meat, and added sugars. DASH stands for Dietary Approaches to Stop Hypertension. What lifestyle changes can be made?   Lose weight if you are overweight. Losing just 3?5% of your body weight can help prevent or control hypertension. ? For example, if your present  weight is 200 lb (91 kg), a loss of 3-5% of your weight means losing 6-10 lb (2.7-4.5 kg). ? Ask your health care provider to help you with a diet and exercise plan to safely lose weight.  Get enough exercise. Do at least 150 minutes of moderate-intensity exercise each week. ? You could do this in short exercise sessions several times a day, or you could do longer exercise sessions a few times a week. For example, you could take a brisk 10-minute walk or bike ride, 3 times a day, for 5 days a week.  Find ways to reduce stress, such as exercising, meditating, listening to music, or taking a yoga class. If you need help reducing stress, ask your health care provider.  Do not smoke. This includes e-cigarettes. Chemicals in tobacco and nicotine products raise your blood pressure each time you smoke. If you need help quitting, ask your health care provider.  Avoid alcohol. If you drink alcohol, limit alcohol intake to no more than 1 drink a day for nonpregnant women and 2 drinks a day for men. One drink equals 12 oz of beer, 5 oz of wine, or 1 oz of hard liquor. Why are these changes important? Diet and lifestyle changes can help you prevent hypertension, and they may make you feel better overall and improve your quality of life. If you have hypertension, making these changes will help you control it and help prevent major complications, such as:  Hardening and narrowing of arteries that supply blood to: ? Your heart. This can cause a heart  attack. ? Your brain. This can cause a stroke. ? Your kidneys. This can cause kidney failure.  Stress on your heart muscle, which can cause heart failure. What can I do to lower my risk?  Work with your health care provider to make a hypertension prevention plan that works for you. Follow your plan and keep all follow-up visits as told by your health care provider.  Learn how to check your blood pressure at home. Make sure that you know your personal target  blood pressure, as told by your health care provider. How is this treated? In addition to diet and lifestyle changes, your health care provider may recommend medicines to help lower your blood pressure. You may need to try a few different medicines to find what works best for you. You also may need to take more than one medicine. Take over-the-counter and prescription medicines only as told by your health care provider. Where to find support Your health care provider can help you prevent hypertension and help you keep your blood pressure at a healthy level. Your local hospital or your community may also provide support services and prevention programs. The American Heart Association offers an online support network at: https://www.lee.net/http://supportnetwork.heart.org/high-blood-pressure Where to find more information Learn more about hypertension from:  National Heart, Lung, and Blood Institute: https://www.peterson.org/www.nhlbi.nih.gov/health/health-topics/topics/hbp  Centers for Disease Control and Prevention: AboutHD.co.nzwww.cdc.gov/bloodpressure  American Academy of Family Physicians: http://familydoctor.org/familydoctor/en/diseases-conditions/high-blood-pressure.printerview.all.html Learn more about the DASH diet from:  National Heart, Lung, and Blood Institute: WedMap.itwww.nhlbi.nih.gov/health/health-topics/topics/dash Contact a health care provider if:  You think you are having a reaction to medicines you have taken.  You have recurrent headaches or feel dizzy.  You have swelling in your ankles.  You have trouble with your vision. Summary  Hypertension often does not cause any symptoms until blood pressure is very high. It is important to get your blood pressure checked regularly.  Diet and lifestyle changes are the most important steps in preventing hypertension.  By keeping your blood pressure in a healthy range, you can prevent complications like heart attack, heart failure, stroke, and kidney failure.  Work with your health care  provider to make a hypertension prevention plan that works for you. This information is not intended to replace advice given to you by your health care provider. Make sure you discuss any questions you have with your health care provider. Document Released: 09/01/2015 Document Revised: 12/09/2018 Document Reviewed: 04/27/2016 Elsevier Patient Education  2020 [employer] Managing Your Hypertension Hypertension is commonly called high blood pressure. This is when the force of your blood pressing against the walls of your arteries is too strong. Arteries are blood vessels that carry blood from your heart throughout your body. Hypertension forces the heart to work harder to pump blood, and may cause the arteries to become narrow or stiff. Having untreated or uncontrolled hypertension can cause heart attack, stroke, kidney disease, and other problems. What are blood pressure readings? A blood pressure reading consists of a higher number over a lower number. Ideally, your blood pressure should be below 120/80. The first ("top") number is called the systolic pressure. It is a measure of the pressure in your arteries as your heart beats. The second ("bottom") number is called the diastolic pressure. It is a measure of the pressure in your arteries as the heart relaxes. What does my blood pressure reading mean? Blood pressure is classified into four stages. Based on your blood pressure reading, your health care provider may use the following stages to determine  what type of treatment you need, if any. Systolic pressure and diastolic pressure are measured in a unit called mm Hg. Normal  Systolic pressure: below 120.  Diastolic pressure: below 80. Elevated  Systolic pressure: 120-129.  Diastolic pressure: below 80. Hypertension stage 1  Systolic pressure: 130-139.  Diastolic pressure: 80-89. Hypertension stage 2  Systolic pressure: 140 or above.  Diastolic pressure: 90 or above. What health  risks are associated with hypertension? Managing your hypertension is an important responsibility. Uncontrolled hypertension can lead to:  A heart attack.  A stroke.  A weakened blood vessel (aneurysm).  Heart failure.  Kidney damage.  Eye damage.  Metabolic syndrome.  Memory and concentration problems. What changes can I make to manage my hypertension? Hypertension can be managed by making lifestyle changes and possibly by taking medicines. Your health care provider will help you make a plan to bring your blood pressure within a normal range. Eating and drinking   Eat a diet that is high in fiber and potassium, and low in salt (sodium), added sugar, and fat. An example eating plan is called the DASH (Dietary Approaches to Stop Hypertension) diet. To eat this way: ? Eat plenty of fresh fruits and vegetables. Try to fill half of your plate at each meal with fruits and vegetables. ? Eat whole grains, such as whole wheat pasta, brown rice, or whole grain bread. Fill about one quarter of your plate with whole grains. ? Eat low-fat diary products. ? Avoid fatty cuts of meat, processed or cured meats, and poultry with skin. Fill about one quarter of your plate with lean proteins such as fish, chicken without skin, beans, eggs, and tofu. ? Avoid premade and processed foods. These tend to be higher in sodium, added sugar, and fat.  Reduce your daily sodium intake. Most people with hypertension should eat less than 1,500 mg of sodium a day.  Limit alcohol intake to no more than 1 drink a day for nonpregnant women and 2 drinks a day for men. One drink equals 12 oz of beer, 5 oz of wine, or 1 oz of hard liquor. Lifestyle  Work with your health care provider to maintain a healthy body weight, or to lose weight. Ask what an ideal weight is for you.  Get at least 30 minutes of exercise that causes your heart to beat faster (aerobic exercise) most days of the week. Activities may include  walking, swimming, or biking.  Include exercise to strengthen your muscles (resistance exercise), such as weight lifting, as part of your weekly exercise routine. Try to do these types of exercises for 30 minutes at least 3 days a week.  Do not use any products that contain nicotine or tobacco, such as cigarettes and e-cigarettes. If you need help quitting, ask your health care provider.  Control any long-term (chronic) conditions you have, such as high cholesterol or diabetes. Monitoring  Monitor your blood pressure at home as told by your health care provider. Your personal target blood pressure may vary depending on your medical conditions, your age, and other factors.  Have your blood pressure checked regularly, as often as told by your health care provider. Working with your health care provider  Review all the medicines you take with your health care provider because there may be side effects or interactions.  Talk with your health care provider about your diet, exercise habits, and other lifestyle factors that may be contributing to hypertension.  Visit your health care provider regularly. Your health  care provider can help you create and adjust your plan for managing hypertension. Will I need medicine to control my blood pressure? Your health care provider may prescribe medicine if lifestyle changes are not enough to get your blood pressure under control, and if:  Your systolic blood pressure is 130 or higher.  Your diastolic blood pressure is 80 or higher. Take medicines only as told by your health care provider. Follow the directions carefully. Blood pressure medicines must be taken as prescribed. The medicine does not work as well when you skip doses. Skipping doses also puts you at risk for problems. Contact a health care provider if:  You think you are having a reaction to medicines you have taken.  You have repeated (recurrent) headaches.  You feel dizzy.  You have  swelling in your ankles.  You have trouble with your vision. Get help right away if:  You develop a severe headache or confusion.  You have unusual weakness or numbness, or you feel faint.  You have severe pain in your chest or abdomen.  You vomit repeatedly.  You have trouble breathing. Summary  Hypertension is when the force of blood pumping through your arteries is too strong. If this condition is not controlled, it may put you at risk for serious complications.  Your personal target blood pressure may vary depending on your medical conditions, your age, and other factors. For most people, a normal blood pressure is less than 120/80.  Hypertension is managed by lifestyle changes, medicines, or both. Lifestyle changes include weight loss, eating a healthy, low-sodium diet, exercising more, and limiting alcohol. This information is not intended to replace advice given to you by your health care provider. Make sure you discuss any questions you have with your health care provider. Document Released: 05/11/2012 Document Revised: 12/09/2018 Document Reviewed: 07/15/2016 Elsevier Patient Education  2020 [employer]. Hypertension, Adult Hypertension is another name for high blood pressure. High blood pressure forces your heart to work harder to pump blood. This can cause problems over time. There are two numbers in a blood pressure reading. There is a top number (systolic) over a bottom number (diastolic). It is best to have a blood pressure that is below 120/80. Healthy choices can help lower your blood pressure, or you may need medicine to help lower it. What are the causes? The cause of this condition is not known. Some conditions may be related to high blood pressure. What increases the risk?  Smoking.  Having type 2 diabetes mellitus, high cholesterol, or both.  Not getting enough exercise or physical activity.  Being overweight.  Having too much fat, sugar, calories, or  salt (sodium) in your diet.  Drinking too much alcohol.  Having long-term (chronic) kidney disease.  Having a family history of high blood pressure.  Age. Risk increases with age.  Race. You may be at higher risk if you are African American.  Gender. Men are at higher risk than women before age 58. After age 32, women are at higher risk than men.  Having obstructive sleep apnea.  Stress. What are the signs or symptoms?  High blood pressure may not cause symptoms. Very high blood pressure (hypertensive crisis) may cause: ? Headache. ? Feelings of worry or nervousness (anxiety). ? Shortness of breath. ? Nosebleed. ? A feeling of being sick to your stomach (nausea). ? Throwing up (vomiting). ? Changes in how you see. ? Very bad chest pain. ? Seizures. How is this treated?  This condition is  treated by making healthy lifestyle changes, such as: ? Eating healthy foods. ? Exercising more. ? Drinking less alcohol.  Your health care provider may prescribe medicine if lifestyle changes are not enough to get your blood pressure under control, and if: ? Your top number is above 130. ? Your bottom number is above 80.  Your personal target blood pressure may vary. Follow these instructions at home: Eating and drinking   If told, follow the DASH eating plan. To follow this plan: ? Fill one half of your plate at each meal with fruits and vegetables. ? Fill one fourth of your plate at each meal with whole grains. Whole grains include whole-wheat pasta, brown rice, and whole-grain bread. ? Eat or drink low-fat dairy products, such as skim milk or low-fat yogurt. ? Fill one fourth of your plate at each meal with low-fat (lean) proteins. Low-fat proteins include fish, chicken without skin, eggs, beans, and tofu. ? Avoid fatty meat, cured and processed meat, or chicken with skin. ? Avoid pre-made or processed food.  Eat less than 1,500 mg of salt each day.  Do not drink alcohol if:  ? Your doctor tells you not to drink. ? You are pregnant, may be pregnant, or are planning to become pregnant.  If you drink alcohol: ? Limit how much you use to:  0-1 drink a day for women.  0-2 drinks a day for men. ? Be aware of how much alcohol is in your drink. In the [place] one drink equals one 12 oz bottle of beer (355 mL), one 5 oz glass of wine (148 mL), or one 1 oz glass of hard liquor (44 mL). Lifestyle   Work with your doctor to stay at a healthy weight or to lose weight. Ask your doctor what the best weight is for you.  Get at least 30 minutes of exercise most days of the week. This may include walking, swimming, or biking.  Get at least 30 minutes of exercise that strengthens your muscles (resistance exercise) at least 3 days a week. This may include lifting weights or doing Pilates.  Do not use any products that contain nicotine or tobacco, such as cigarettes, e-cigarettes, and chewing tobacco. If you need help quitting, ask your doctor.  Check your blood pressure at home as told by your doctor.  Keep all follow-up visits as told by your doctor. This is important. Medicines  Take over-the-counter and prescription medicines only as told by your doctor. Follow directions carefully.  Do not skip doses of blood pressure medicine. The medicine does not work as well if you skip doses. Skipping doses also puts you at risk for problems.  Ask your doctor about side effects or reactions to medicines that you should watch for. Contact a doctor if you:  Think you are having a reaction to the medicine you are taking.  Have headaches that keep coming back (recurring).  Feel dizzy.  Have swelling in your ankles.  Have trouble with your vision. Get help right away if you:  Get a very bad headache.  Start to feel mixed up (confused).  Feel weak or numb.  Feel faint.  Have very bad pain in your: ? Chest. ? Belly (abdomen).  Throw up more than once.  Have  trouble breathing. Summary  Hypertension is another name for high blood pressure.  High blood pressure forces your heart to work harder to pump blood.  For most people, a normal blood pressure is less than 120/80.  Making healthy choices can help lower blood pressure. If your blood pressure does not get lower with healthy choices, you may need to take medicine. This information is not intended to replace advice given to you by your health care provider. Make sure you discuss any questions you have with your health care provider. Document Released: 02/03/2008 Document Revised: 04/27/2018 Document Reviewed: 04/27/2018 Elsevier Patient Education  2020 [employer].

## 2019-08-01 NOTE — ED Triage Notes (Signed)
Patient was at work today and her BP was elevated so they told her to be seen in the ER or UC.

## 2019-08-01 NOTE — ED Provider Notes (Signed)
[place], [place]   Name: TANIAH REINECKE DOB: [birth year] MRN: [id number] CSN: [id number] PCP: Gabriel Cirri, NP  Arrival date and time:  08/01/19 1016  Chief Complaint:  Hypertension   NOTE: Prior to seeing the patient today, I have reviewed the triage nursing documentation and vital signs. Clinical staff has updated patient's PMH/PSHx, current medication list, and drug allergies/intolerances to ensure comprehensive history available to assist in medical decision making.   History:   HPI: ONETA SIGMAN is a 58 y.o. female who presents today with complaints of elevated blood pressure and chest tightness. Patient was seen earlier today by the occupational medicine nurse at her job ([employer]) and advised the same. She advised her job that the chest tightness was due to her stomach citing that she has a history of acid reflux. She uses calcium carbonate tablets PRN reflux; formally on daily PPI therapy. Blood pressures at work were in the 150-160s/90-100s. Patient advising that her job called PCP's office for an urgent appointment, however they were unable to see her. She was then advised to present to the emergency department, however states, "I just didn't want to go over there".   Patient presents today "feeling fine" other than continued "very mild tightness" in her chest that comes and goes. Patient denies actual pain in her chest, shoulder, neck, jaw, or subscapular areas. Patient is not having any pain in her LEFT upper extremity. She denies associated shortness of breath, nausea, vomiting, or diaphoresis. Patient reporting that she has been under a great deal of stress over the last week or so with the Thanksgiving holiday. She also makes mention of the fact that her mother suffered a fall yesterday, which has also contributed to her overall stress level.   Patient denies a past medical history significant for any known cardiac problems. In review of her EMR, she had a similar presentation in  2017, at which time she was seen in the ED. Patient underwent an EST that was negative. Patient does not have a history of any sort of cardiac arrhythmias; no pacemaker or AICD. She notes that her family history is significant for DM, HTN, PE, and heart disease. She has never personally been diagnosed with a VTE in the past. She is not on daily anticoagulation therapy. Patient does take aspirin on a daily basis. Patient has a history of HTN, however she does not take medication at this point. She states, "my blood pressure will flare up from time to time when I get stressed, but then come right back down. It was low a week and a half ago".   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   Hyperlipidemia    Hypertension    Sleep apnea     Past Surgical History:  Procedure Laterality Date   arm muscle repair     TONSILLECTOMY      Family History  Problem Relation Age of Onset   Diabetes Mother    Heart disease Mother    Hypertension Mother    Clotting disorder Father    Bladder Cancer Father 92   Hypertension Father    Breast cancer Maternal Aunt 19   Colon cancer Maternal Grandmother 75   Hypertension Brother     Social History   Tobacco Use   Smoking status: Never Smoker   Smokeless tobacco: Never Used  Substance Use Topics   Alcohol use: Yes    Alcohol/week:  2.0 standard drinks    Types: 2 Standard drinks or equivalent per week    Comment: socially   Drug use: No    Patient Active Problem List   Diagnosis Date Noted   Cervical high risk human papillomavirus (HPV) DNA test positive 08/02/2018   Chest pain, unspecified 03/13/2016   Chest pain 03/11/2016   Allergic rhinitis 03/04/2016    Home Medications:    Current Meds  Medication Sig   aspirin EC 81 MG tablet Take 81 mg by mouth daily.   Multiple Vitamin (MULTIVITAMIN) capsule Take 1 capsule by mouth  daily.   [DISCONTINUED] BIOTIN PO Take 1 tablet by mouth daily.     Allergies:   Biaxin [clarithromycin], Doxycycline, Levaquin [levofloxacin], Nitrofuran derivatives, and Penicillins  Review of Systems (ROS): Review of Systems  Constitutional: Negative for chills and fever.  Respiratory: Positive for chest tightness. Negative for cough and shortness of breath.   Cardiovascular: Negative for chest pain and palpitations.       Intermittent BP elevations  Gastrointestinal: Negative for abdominal pain, diarrhea, nausea and vomiting.       PMH (+) acid reflux  Musculoskeletal: Negative for back pain and neck pain.  Skin: Negative for color change, pallor and rash.  Neurological: Negative for dizziness, syncope, weakness, numbness and headaches.  Psychiatric/Behavioral: The patient is nervous/anxious.        (+) increased family/situational stress  All other systems reviewed and are negative.    Vital Signs: Today's Vitals   08/01/19 1049 08/01/19 1051 08/01/19 1218  BP:  (!) 145/89   Pulse:  82   Resp:  18   Temp:  98.2 F (36.8 C)   TempSrc:  Oral   SpO2:  98%   Weight: 200 lb (90.7 kg)    Height: 5\' 5"  (1.651 m)    PainSc: 0-No pain  0-No pain    Physical Exam: Physical Exam  Constitutional: She is oriented to person, place, and time and well-developed, well-nourished, and in no distress.  HENT:  Head: Normocephalic and atraumatic.  Mouth/Throat: Mucous membranes are normal.  Eyes: Pupils are equal, round, and reactive to light. EOM are normal.  Neck: Normal range of motion. Neck supple.  Cardiovascular: Normal rate, regular rhythm, normal heart sounds and intact distal pulses. Exam reveals no gallop and no friction rub.  No murmur heard. Pulmonary/Chest: Effort normal and breath sounds normal. No respiratory distress. She has no wheezes. She has no rales.  Abdominal: Soft. Bowel sounds are normal. She exhibits no distension. There is no abdominal tenderness. There is  no rebound.  Neurological: She is alert and oriented to person, place, and time. Gait normal.  Skin: Skin is warm and dry. No rash noted.  Psychiatric: Memory, affect and judgment normal. Her mood appears anxious.  Nursing note and vitals reviewed.   Urgent Care Treatments / Results:   LABS: PLEASE NOTE: all labs that were ordered this encounter are listed, however only abnormal results are displayed. Labs Reviewed  BASIC METABOLIC PANEL  CBC  TROPONIN I (HIGH SENSITIVITY)    URGENT CARE ECG REPORT Date: 08/02/2019 Time ECG obtained: 11:34 AM Rate: 71 bpm Rhythm: normal sinus rhythm Intervals: normal ST segment and T wave changes: No evidence of ST segment elevation or depression Comparison: Similar to previous tracing obtained on 03/09/2016   RADIOLOGY: No results found.  PROCEDURES: Procedures  MEDICATIONS RECEIVED THIS VISIT: Medications - No data to display  PERTINENT CLINICAL COURSE NOTES/UPDATES:   Initial Impression / Assessment  and Plan / Urgent Care Course:  Pertinent labs & imaging results that were available during my care of the patient were personally reviewed by me and considered in my medical decision making (see lab/imaging section of note for values and interpretations).  PARYS ELENBAAS is a 58 y.o. female who presents to [facility] today with complaints of Hypertension   Patient is well appearing overall in clinic today. She does not appear to be in any acute distress. Presenting symptoms (see HPI) and exam as documented above. Patient sent by occupational health nurse for further evaluation of elevated blood pressure and chest tightness. Patient presents with mildly elevated blood pressure of 145/89. Patient formally on antihypertensive medications, however she advises that her PCP was able to discontinue medication as blood pressure come under better control with weight loss. Given her PMH (+) of HTN, OSA, and HLD, coupled with a family history  of cardiac disease, the decision was made to pursue further work up in clinic today for ACS.   EKG normal  CBC, BMP, and hs-TnI normal  Suspect symptoms to be multifactorial in nature. Patient under a great deal of stress with the holidays and due to her elderly mother suffering a fall yesterday. This could certainly account for her transient elevations in blood pressure. With that being said, we discussed the often atypical presentation of ACS in females. I discussed at length that a single normal EKG and troponin could not fully rule out the possibility of her condition being cardiac in nature. Recommended for patient to present to the emergency department for continued cardiac monitoring and serial trending of her cardiac markers, however she adamantly refused citing that she is "fine". Discussed strict return precautions with the patient. She is aware that any chest pain, SOB, N/V, dizziness, unexplained fatigue/weakness, or diaphoresis needs immediate evaluation in the emergency department. Patient states, "I understand what you are say, but I am telling you I am fine".   Regarding her reflux. Patient formally on PPI therapy, which was ultimately discontinued by her PCP. She notes that she has been having to use calcium carbonate tablets on a fairly consistent basis. She is interested in restarting her daily PPI today. Will oblige request and restart her on pantoprazole. Patient will need to follow up with her PCP to discuss effectiveness of intervention and need for potential dose titration.   Discussed follow up with primary care physician for re-evaluation and ongoing management. I encouraged her to call and schedule the next available appointment. Again, I have reviewed the follow up and strict return precautions for any new or worsening symptoms. Patient is aware of symptoms that would be deemed urgent/emergent, and would thus require further evaluation either here or in the emergency  department. At the time of discharge, she verbalized understanding and consent with the discharge plan as it was reviewed with her. All questions were fielded by provider and/or clinic staff prior to patient discharge.    Final Clinical Impressions / Urgent Care Diagnoses:   Final diagnoses:  Anxiety  Elevated blood pressure reading  Chest tightness  Gastroesophageal reflux disease, unspecified whether esophagitis present    New Prescriptions:  [place] Controlled Substance Registry consulted? Not Applicable  Meds ordered this encounter  Medications   pantoprazole (PROTONIX) 40 MG tablet    Sig: Take 1 tablet (40 mg total) by mouth daily before breakfast.    Dispense:  30 tablet    Refill:  0    Recommended Follow up Care:  Patient encouraged to follow up with the following provider within the specified time frame, or sooner as dictated by the severity of her symptoms. As always, she was instructed that for any urgent/emergent care needs, she should seek care either here or in the emergency department for more immediate evaluation.  Follow-up Information    Schedule an appointment as soon as possible for a visit  with Kathrine Haddock, NP.   Specialties: Nurse Practitioner, Family Medicine Contact information: [address] [place] [address] [phone]         NOTE: This note was prepared using Dragon dictation software along with smaller phrase technology. Despite my best ability to proofread, there is the potential that transcriptional errors may still occur from this process, and are completely unintentional.    Karen Kitchens, NP 08/02/19 [phone]

## 2019-08-01 NOTE — Telephone Encounter (Signed)
Has not been seen in office since 2019 and I have not met patient, please ensure follow-up scheduled after ER visit.  Needs to be seen in office ASAP.

## 2019-08-01 NOTE — Telephone Encounter (Signed)
Patient was seen at clinic  [facility] by nurse.  Nurse states that she came in stating that since Thanksgiving the patient has notice her BP rising and she has had chest tightness. Today at the clinic manual BP 168/108 and 158/98. She came in with the chest tightness that patient states is related to her stomach. She has no other neuro symptoms.  She is not taking BP medication. She has been taken off after weight loss. Per protocol patient will go to ER for evaluation of her symptoms. Patient and nurse verbalized understanding. Patient will call back for follow up on BP.   Reason for Disposition . [6] Systolic BP  >= 195 OR Diastolic >= 093 AND [2] cardiac or neurologic symptoms (e.g., chest pain, difficulty breathing, unsteady gait, blurred vision)  Answer Assessment - Initial Assessment Questions 1. BLOOD PRESSURE: "What is the blood pressure?" "Did you take at least two measurements 5 minutes apart?"     168/108 158/98 2. ONSET: "When did you take your blood pressure?"     Over weekend 3. HOW: "How did you obtain the blood pressure?" (e.g., visiting nurse, automatic home BP monitor)     At clinic manual 4. HISTORY: "Do you have a history of high blood pressure?"    yes 5. MEDICATIONS: "Are you taking any medications for blood pressure?" "Have you missed any doses recently?"   Taken off medication 6. OTHER SYMPTOMS: "Do you have any symptoms?" (e.g., headache, chest pain, blurred vision, difficulty breathing, weakness)     Chest tightness 7. PREGNANCY: "Is there any chance you are pregnant?" "When was your last menstrual period?"     N/A  Protocols used: HIGH BLOOD PRESSURE-A-AH

## 2019-08-01 NOTE — Progress Notes (Signed)
   Subjective:    Patient ID: Stacie Bell, female    DOB: 27-Aug-1961, 58 y.o.   MRN: [id number]  Presented to onsite clinic with concerns related to her BP being elevated. She does not currently take any medication for her BP and states that she has been under some stress lately. She does state she has some chest tightness but relates it to indigestion from eating too fast. Refuses offer for aspirin/tums at this time.     Review of Systems  Cardiovascular:       Chest tightness  All other systems reviewed and are negative.      Objective:   Physical Exam Vitals signs and nursing note reviewed.  HENT:     Head: Normocephalic.     Nose: Nose normal.     Mouth/Throat:     Mouth: Mucous membranes are moist.  Neck:     Musculoskeletal: Normal range of motion.  Cardiovascular:     Rate and Rhythm: Normal rate and regular rhythm.     Pulses: Normal pulses.     Heart sounds: Normal heart sounds.  Pulmonary:     Effort: Pulmonary effort is normal.  Musculoskeletal: Normal range of motion.  Skin:    General: Skin is warm and dry.     Capillary Refill: Capillary refill takes less than 2 seconds.  Neurological:     Mental Status: She is alert.     Assessment & Plan:   Wt Readings from Last 3 Encounters:  05/29/19 199 lb (90.3 kg)  08/02/18 205 lb (93 kg)  04/04/18 197 lb 1 oz (89.4 kg)   Temp Readings from Last 3 Encounters:  08/01/19 98.2 F (36.8 C) (Oral)  04/04/18 98.2 F (36.8 C)  08/20/17 97.7 F (36.5 C) (Oral)   BP Readings from Last 3 Encounters:  08/01/19 (!) 168/108  05/29/19 120/66  08/02/18 128/82   Pulse Readings from Last 3 Encounters:  08/01/19 93  05/29/19 70  08/02/18 68     Lab Results  Component Value Date   POCGLU 96 05/29/2019           Thank you!!  Apolonio Schneiders RN  [place] Nurse Specialist [place]: [phone]  Cell:  [phone] Website:  [email or web address]

## 2019-08-01 NOTE — Discharge Instructions (Addendum)
It was very nice seeing you today in clinic. Thank you for entrusting me with your care.   Your labs and EKG were normal in clinic today. As discussed, a single negative cardiac lab does not rule out a problem with your heart. I would still recommend that you be seen in the ED for further testing and rule out, however you advised that you did not wish to be seen at this time.   I would recommend taking something over the counter for your intermittent reflux. You need to call your primary care provider for an appointment to be seen for a routine health maintenance visit ASAP.   If your symptoms/condition worsens, please seek follow up care either here or in the ER. Please remember, our [place] providers are "right here with you" when you need [place].   Again, it was my pleasure to take care of you today. Thank you for choosing our clinic. I hope that you start to feel better quickly.   Honor Loh, MSN, APRN, FNP-C, CEN Advanced Practice Provider [facility]

## 2019-08-01 NOTE — Telephone Encounter (Signed)
LVM for pt to call back to schedule appt

## 2019-08-03 ENCOUNTER — Ambulatory Visit: Payer: BC Managed Care – PPO | Admitting: Family Medicine

## 2019-08-03 ENCOUNTER — Other Ambulatory Visit: Payer: Self-pay

## 2019-08-03 ENCOUNTER — Encounter: Payer: Self-pay | Admitting: Family Medicine

## 2019-08-03 VITALS — BP 133/86 | HR 73 | Temp 97.5°F | Ht 65.0 in | Wt 201.0 lb

## 2019-08-03 DIAGNOSIS — R030 Elevated blood-pressure reading, without diagnosis of hypertension: Secondary | ICD-10-CM

## 2019-08-03 DIAGNOSIS — R079 Chest pain, unspecified: Secondary | ICD-10-CM | POA: Diagnosis not present

## 2019-08-03 DIAGNOSIS — M549 Dorsalgia, unspecified: Secondary | ICD-10-CM

## 2019-08-03 LAB — UA/M W/RFLX CULTURE, ROUTINE
Bilirubin, UA: NEGATIVE
Glucose, UA: NEGATIVE
Ketones, UA: NEGATIVE
Leukocytes,UA: NEGATIVE
Nitrite, UA: NEGATIVE
Protein,UA: NEGATIVE
RBC, UA: NEGATIVE
Specific Gravity, UA: 1.025 (ref 1.005–1.030)
Urobilinogen, Ur: 0.2 mg/dL (ref 0.2–1.0)
pH, UA: 5 (ref 5.0–7.5)

## 2019-08-03 MED ORDER — CYCLOBENZAPRINE HCL 5 MG PO TABS: 5.0000 mg | ORAL_TABLET | Freq: Three times a day (TID) | ORAL | 0 refills | Status: DC | PRN

## 2019-08-03 MED ORDER — LISINOPRIL 5 MG PO TABS: 5.0000 mg | ORAL_TABLET | Freq: Every day | ORAL | 0 refills | Status: DC

## 2019-08-03 NOTE — Progress Notes (Signed)
BP 133/86   Pulse 73   Temp (!) 97.5 F (36.4 C) (Oral)   Ht 5\' 5"  (1.651 m)   Wt 201 lb (91.2 kg)   LMP  (LMP Unknown)   SpO2 96%   BMI 33.45 kg/m    Subjective:    Patient ID: , female    DOB: [birth year], 58 y.o.   MRN: [id number]  HPI: Stacie Bell is a 58 y.o. female  Chief Complaint  Patient presents with  . Back Pain    lower back off and on for months now  . Hypertension    up and down x about a week   Several months of lower back pain intermittently worst on the right. Does a lot of lifting and twisting at work, thinks this is causing the discomfort. Most of the time only hurts some days after work. Not really trying anything OTC for relief. Denies radiation of pain, leg weakness, numbness tingling.   BPs the past few weeks have been varying quite a bit, 120-150s/90s-105. Under a significant amount of stress with some health issues in her immediate family and holiday stress. Has intermittently had elevated BPs in the past, but lost some weight several years ago and was able to come off medication for the time. Denies SOB, HAs, dizziness. Has had somemild chest tightness but went to UC for this 2 days ago and was told it was likely reflux and started on protonix. This has been worked up by Cardiology in the past without obvious ischemic cause. Just started the protonix yesterday, unsure if it's helping yet.   Depression screen [facility] 2/9 08/03/2019 04/04/2018  Decreased Interest 0 0  Down, Depressed, Hopeless 1 0  PHQ - 2 Score 1 0  Altered sleeping 1 0  Tired, decreased energy 1 1  Change in appetite 0 0  Feeling bad or failure about yourself  0 0  Trouble concentrating 1 0  Moving slowly or fidgety/restless 0 0  Suicidal thoughts 0 0  PHQ-9 Score 4 1  Difficult doing work/chores - Not difficult at all   GAD 7 : Generalized Anxiety Score 08/03/2019  Nervous, Anxious, on Edge 1  Control/stop worrying 0  Worry too much - different things 1  Trouble  relaxing 1  Restless 0  Easily annoyed or irritable 1  Afraid - awful might happen 0  Total GAD 7 Score 4  Anxiety Difficulty Somewhat difficult     Relevant past medical, surgical, family and social history reviewed and updated as indicated. Interim medical history since our last visit reviewed. Allergies and medications reviewed and updated.  Review of Systems  Per HPI unless specifically indicated above     Objective:    BP 133/86   Pulse 73   Temp (!) 97.5 F (36.4 C) (Oral)   Ht 5\' 5"  (1.651 m)   Wt 201 lb (91.2 kg)   LMP  (LMP Unknown)   SpO2 96%   BMI 33.45 kg/m   Wt Readings from Last 3 Encounters:  08/03/19 201 lb (91.2 kg)  08/01/19 200 lb (90.7 kg)  05/29/19 199 lb (90.3 kg)    Physical Exam Vitals signs and nursing note reviewed.  Constitutional:      Appearance: Normal appearance. She is not ill-appearing.  HENT:     Head: Atraumatic.  Eyes:     Extraocular Movements: Extraocular movements intact.     Conjunctiva/sclera: Conjunctivae normal.  Neck:     Musculoskeletal: Normal range of  motion and neck supple.  Cardiovascular:     Rate and Rhythm: Normal rate and regular rhythm.     Heart sounds: Normal heart sounds.  Pulmonary:     Effort: Pulmonary effort is normal.     Breath sounds: Normal breath sounds.  Abdominal:     General: Bowel sounds are normal.     Palpations: Abdomen is soft.     Tenderness: There is no abdominal tenderness. There is no guarding.  Musculoskeletal: Normal range of motion.        General: Tenderness (paraspinal muscles ttp in right thoracic and lumbar regions) present. No swelling.     Comments: - SLR b/l  Skin:    General: Skin is warm and dry.  Neurological:     General: No focal deficit present.     Mental Status: She is alert and oriented to person, place, and time.     Motor: No weakness.     Gait: Gait normal.  Psychiatric:        Mood and Affect: Mood normal.        Thought Content: Thought content  normal.        Judgment: Judgment normal.     Results for orders placed or performed in visit on 08/03/19  UA/M w/rflx Culture, Routine   Specimen: Urine   URINE  Result Value Ref Range   Specific Gravity, UA 1.025 1.005 - 1.030   pH, UA 5.0 5.0 - 7.5   Color, UA Yellow Yellow   Appearance Ur Clear Clear   Leukocytes,UA Negative Negative   Protein,UA Negative Negative/Trace   Glucose, UA Negative Negative   Ketones, UA Negative Negative   RBC, UA Negative Negative   Bilirubin, UA Negative Negative   Urobilinogen, Ur 0.2 0.2 - 1.0 mg/dL   Nitrite, UA Negative Negative      Assessment & Plan:   Problem List Items Addressed This Visit      Other   Chest pain    Normal EKG 2 days ago at [facility], and feeling some better now. Continue protonix, stress control. F/u if sxs worsen. Declining to go to ER both at [facility] and now.        Other Visit Diagnoses    Elevated blood pressure reading    -  Primary   Low dose lisinopril sent in case readings continue to be elevated. Pt requesting to monitor for the next week before starting. F/u in 1 month if starting   Back pain, unspecified back location, unspecified back pain laterality, unspecified chronicity       Appears muscular. Lidocaine patches, flexeril, OTC pain relievers, heat, stretches. Work on core strength and functional movements at work   Relevant Medications   cyclobenzaprine (FLEXERIL) 5 MG tablet   Other Relevant Orders   UA/M w/rflx Culture, Routine (Completed)       Follow up plan: Return in about 4 weeks (around 08/31/2019) for BP if starting the BP medication.

## 2019-08-07 ENCOUNTER — Ambulatory Visit: Payer: Self-pay | Admitting: Nurse Practitioner

## 2019-08-07 NOTE — Assessment & Plan Note (Signed)
Normal EKG 2 days ago at [facility], and feeling some better now. Continue protonix, stress control. F/u if sxs worsen. Declining to go to ER both at [facility] and now.

## 2019-08-09 NOTE — Telephone Encounter (Signed)
Called pt to schedule 4 week f/u 1/6 since she began medicine on Monday

## 2019-08-09 NOTE — Telephone Encounter (Signed)
Noted, 09/06/2019 is fine.

## 2019-08-11 ENCOUNTER — Emergency Department: Payer: BC Managed Care – PPO

## 2019-08-11 ENCOUNTER — Other Ambulatory Visit: Payer: Self-pay

## 2019-08-11 ENCOUNTER — Emergency Department
Admission: EM | Admit: 2019-08-11 | Discharge: 2019-08-11 | Disposition: A | Discharge status: Home / Self Care | Payer: BC Managed Care – PPO | Attending: Emergency Medicine | Admitting: Emergency Medicine

## 2019-08-11 DIAGNOSIS — Z79899 Other long term (current) drug therapy: Secondary | ICD-10-CM | POA: Insufficient documentation

## 2019-08-11 DIAGNOSIS — R079 Chest pain, unspecified: Secondary | ICD-10-CM | POA: Insufficient documentation

## 2019-08-11 DIAGNOSIS — Z7982 Long term (current) use of aspirin: Secondary | ICD-10-CM | POA: Insufficient documentation

## 2019-08-11 DIAGNOSIS — I10 Essential (primary) hypertension: Secondary | ICD-10-CM | POA: Insufficient documentation

## 2019-08-11 DIAGNOSIS — R0789 Other chest pain: Secondary | ICD-10-CM | POA: Diagnosis not present

## 2019-08-11 LAB — TROPONIN I (HIGH SENSITIVITY)
Troponin I (High Sensitivity): 3 ng/L (ref <18)
Troponin I (High Sensitivity): 3 ng/L (ref <18)

## 2019-08-11 LAB — BASIC METABOLIC PANEL
Anion gap: 11 (ref 5–15)
BUN: 11 mg/dL (ref 6–20)
CO2: 23 mmol/L (ref 22–32)
Calcium: 9.3 mg/dL (ref 8.9–10.3)
Chloride: 105 mmol/L (ref 98–111)
Creatinine, Ser: 0.7 mg/dL (ref 0.44–1.00)
GFR calc Af Amer: >60 mL/min (ref >60)
GFR calc non Af Amer: >60 mL/min (ref >60)
Glucose, Bld: 126 mg/dL — ABNORMAL HIGH (ref 70–99)
Potassium: 3.7 mmol/L (ref 3.5–5.1)
Sodium: 139 mmol/L (ref 135–145)

## 2019-08-11 LAB — CBC
HCT: 41.3 % (ref 36.0–46.0)
Hemoglobin: 14.2 g/dL (ref 12.0–15.0)
MCH: 29.5 pg (ref 26.0–34.0)
MCHC: 34.4 g/dL (ref 30.0–36.0)
MCV: 85.9 fL (ref 80.0–100.0)
Platelets: 214 10*3/uL (ref 150–400)
RBC: 4.81 MIL/uL (ref 3.87–5.11)
RDW: 12.5 % (ref 11.5–15.5)
WBC: 7.6 10*3/uL (ref 4.0–10.5)
nRBC: 0 % (ref 0.0–0.2)

## 2019-08-11 IMAGING — CR DG CHEST 2V
1 series · 2 of 2 positions shown · non-contrast
Comparison: None.

CLINICAL DATA: Chest pain

EXAM:
CHEST - 2 VIEW

[Series 1: dg chest 2 view · 0.14mm/px · 2 of 2 slices shown]
[im 1/2]
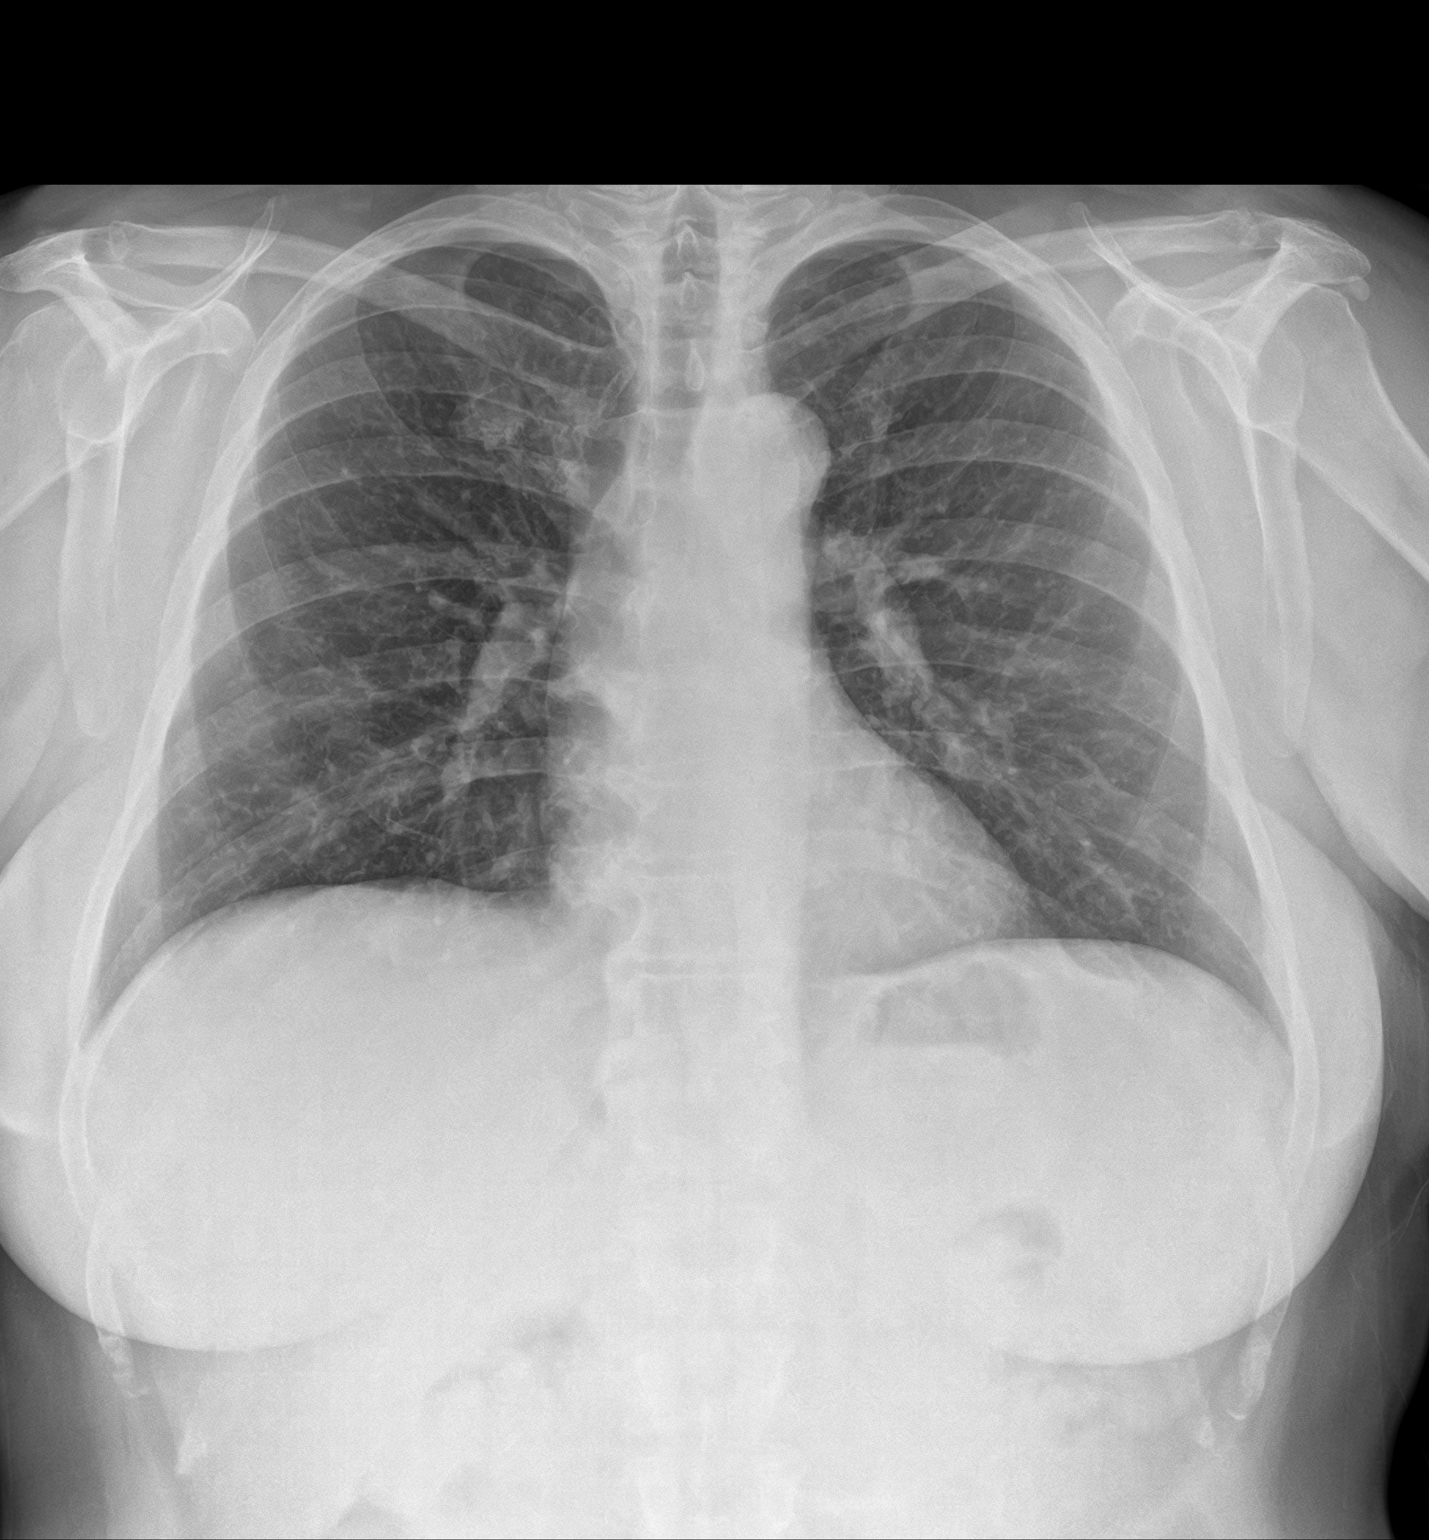
[im 2/2]
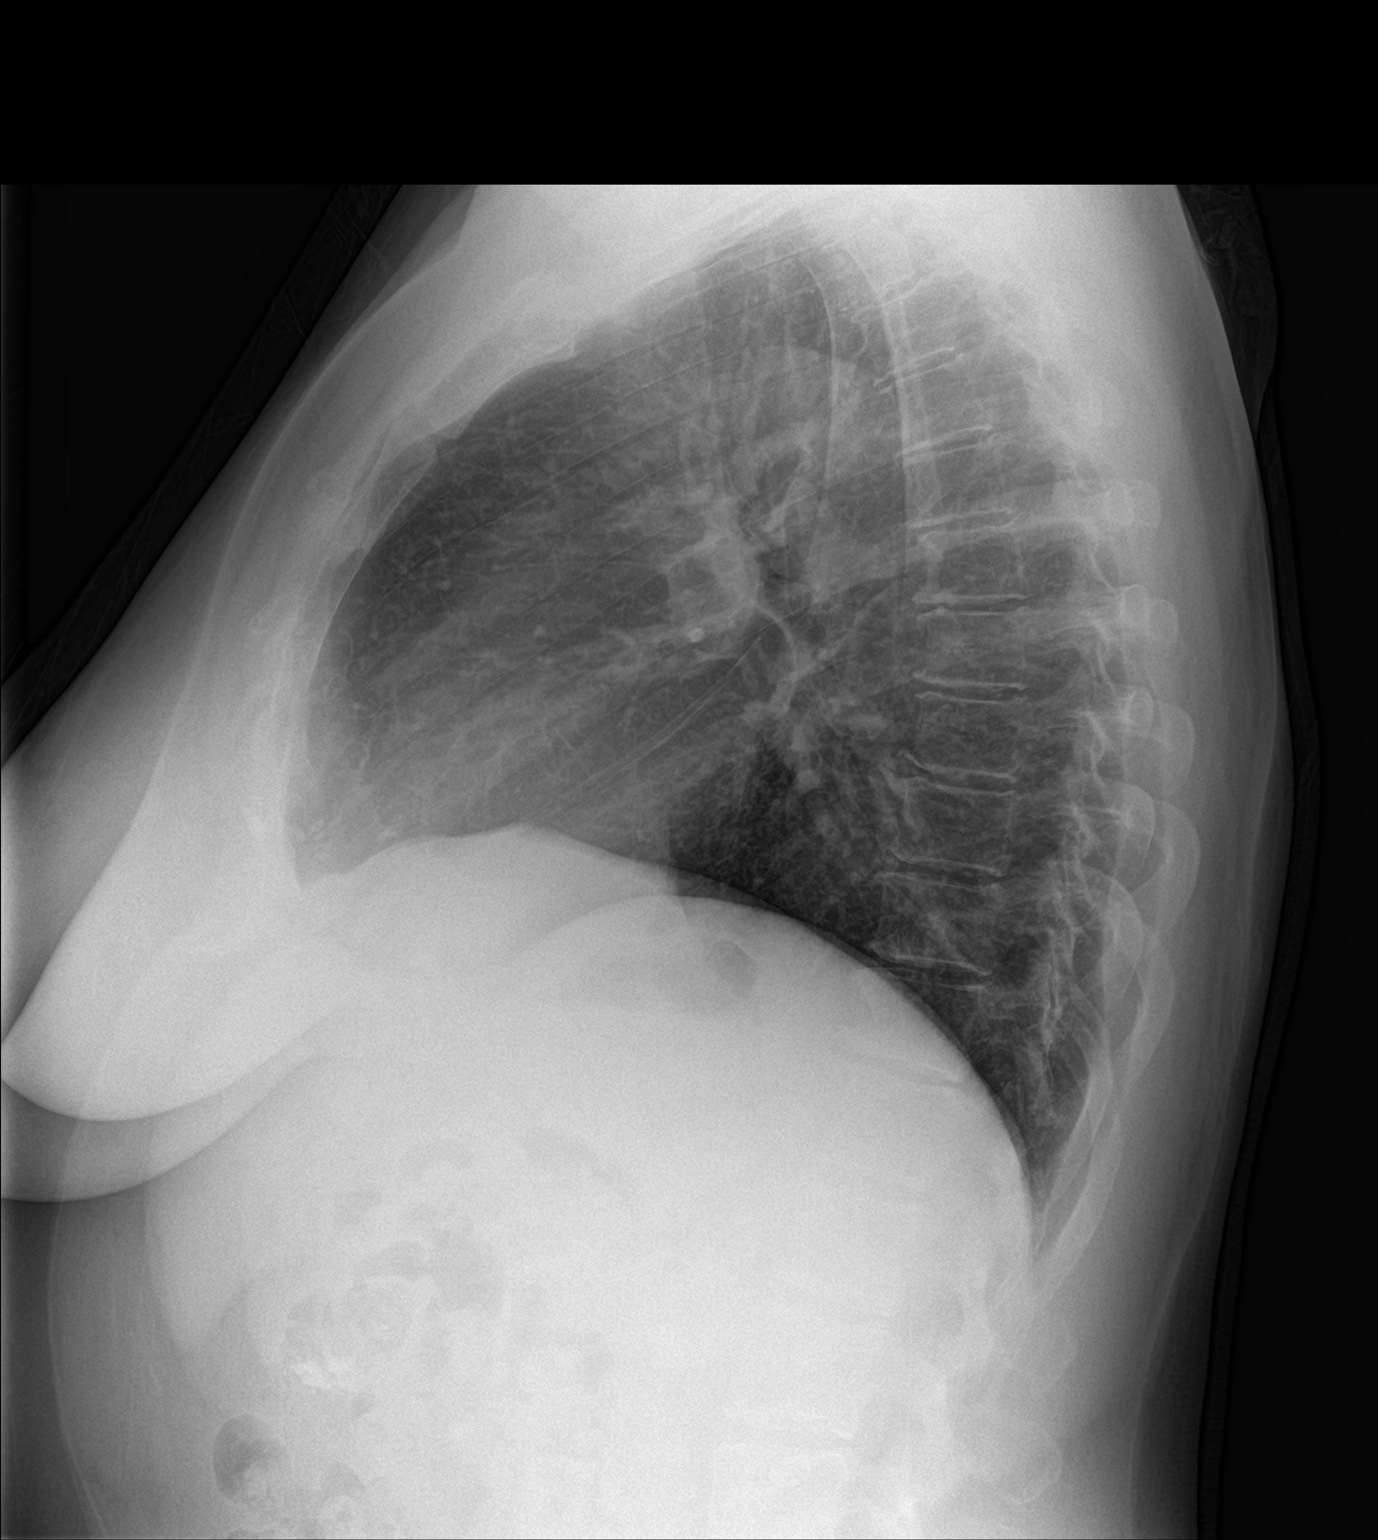

[2 of 2 positions shown; findings below may reference images not displayed]

FINDINGS: The heart size and mediastinal contours are within normal limits.
Both lungs are clear. The visualized skeletal structures are
unremarkable.
IMPRESSION: No active cardiopulmonary disease.

## 2019-08-11 MED ORDER — LIDOCAINE 5 % EX PTCH
1.0000 | MEDICATED_PATCH | CUTANEOUS | Status: DC
  Administered 2019-08-11: 1 via TRANSDERMAL
  Filled 2019-08-11: qty 1

## 2019-08-11 MED ORDER — ACETAMINOPHEN 500 MG PO TABS
1000.0000 mg | ORAL_TABLET | Freq: Once | ORAL | Status: AC
  Administered 2019-08-11: 1000 mg via ORAL
  Filled 2019-08-11: qty 2

## 2019-08-11 NOTE — ED Provider Notes (Signed)
[facility] Emergency Department Provider Note  ____________________________________________   First MD Initiated Contact with Patient 08/11/19 1630     (approximate)  I have reviewed the triage vital signs and the nursing notes.   HISTORY  Chief Complaint Chest Pain    HPI Stacie Bell is a 58 y.o. female with hypertension, hyperlipidemia who comes in for chest pain.  Patient states she was recently started on lisinopril for high blood pressure.  Patient states that her chest pain has been intermittent, not associated with exercise.  She states she walks 3 mi a day and does fine.  She thinks it might be more related to anxiety.  She states the pain started today at 1030 while at work.  It was okay chest tightness feeling.  It was nonradiating.  Initially had a little bit of tingling her left arm but that is now resolved.  She states that her blood pressure was a little elevated at 150s when it came on.  She states that this pain is now resolved.  She has a separate pain in her middle of her back that is going on for 3 months.  It seems to be more so related to musculoskeletal pain.  It is better with Flexeril.  She states that she does lift 35 to 50 pounds at work and so she thinks that she just strained a muscle.          Past Medical History:  Diagnosis Date  . CIN I (cervical intraepithelial neoplasia I) 07/2012  . 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 Hyperlipidemia   . Hypertension   . Sleep apnea     Patient Active Problem List   Diagnosis Date Noted  . Cervical high risk human papillomavirus (HPV) DNA test positive 08/02/2018  . Chest pain, unspecified 03/13/2016  . Chest pain 03/11/2016  . Allergic rhinitis 03/04/2016    Past Surgical History:  Procedure Laterality Date  . arm muscle repair    . TONSILLECTOMY      Prior to Admission medications   Medication Sig Start Date End Date Taking? Authorizing Provider  aspirin EC 81 MG tablet  Take 81 mg by mouth daily.    [provider]  BIOTIN PO Take by mouth daily.    [provider]  Cyanocobalamin (VITAMIN B 12 PO) Take by mouth daily.    [provider]  cyclobenzaprine (FLEXERIL) 5 MG tablet Take 1 tablet (5 mg total) by mouth 3 (three) times daily as needed for muscle spasms. 08/03/19   Particia NearingLane, Rachel Elizabeth, PA-C  lisinopril (ZESTRIL) 5 MG tablet Take 1 tablet (5 mg total) by mouth daily. 08/03/19   Particia NearingLane, Rachel Elizabeth, PA-C  Multiple Vitamin (MULTIVITAMIN) capsule Take 1 capsule by mouth daily.    [provider]  Omega-3 Fatty Acids (FISH OIL PO) Take by mouth daily.    [provider]  pantoprazole (PROTONIX) 40 MG tablet Take 1 tablet (40 mg total) by mouth daily before breakfast. 08/01/19   Verlee MonteGray, Bryan E, NP  fluticasone (FLONASE) 50 MCG/ACT nasal spray Place 2 sprays into both nostrils daily. Patient not taking: Reported on 08/02/2018 08/20/17 08/01/19  Gabriel CirriWicker, Cheryl, NP    Allergies Biaxin [clarithromycin], Doxycycline, Levaquin [levofloxacin], Nitrofuran derivatives, and Penicillins  Family History  Problem Relation Age of Onset  . Diabetes Mother   . Heart disease Mother   . Hypertension Mother   . Clotting disorder Father   . Bladder Cancer Father [id number]  . Hypertension  Father   . Breast cancer Maternal Aunt 60  . Colon cancer Maternal Grandmother 110  . Hypertension Brother     Social History Social History   Tobacco Use  . Smoking status: Never Smoker  . Smokeless tobacco: Never Used  Substance Use Topics  . Alcohol use: Not Currently    Alcohol/week: 2.0 standard drinks    Types: 2 Standard drinks or equivalent per week    Comment: socially  . Drug use: No      Review of Systems Constitutional: No fever/chills Eyes: No visual changes. ENT: No sore throat. Cardiovascular: Positive chest pain Respiratory: Denies shortness of breath. Gastrointestinal: No abdominal pain.  No nausea, no vomiting.   No diarrhea.  No constipation. Genitourinary: Negative for dysuria. Musculoskeletal: Positive back pain Skin: Negative for rash. Neurological: Negative for headaches, focal weakness or numbness. All other ROS negative ____________________________________________   PHYSICAL EXAM:  VITAL SIGNS: ED Triage Vitals  Enc Vitals Group     BP 08/11/19 1136 117/87     Pulse Rate 08/11/19 1136 83     Resp 08/11/19 1136 18     Temp 08/11/19 1136 98.8 F (37.1 C)     Temp Source 08/11/19 1136 Oral     SpO2 08/11/19 1136 95 %     Weight 08/11/19 1132 200 lb (90.7 kg)     Height 08/11/19 1132 5\' 5"  (1.651 m)     Head Circumference --      Peak Flow --      Pain Score 08/11/19 1132 2     Pain Loc --      Pain Edu? --      Excl. in GC? --     Constitutional: Alert and oriented. Well appearing and in no acute distress. Eyes: Conjunctivae are normal. EOMI. Head: Atraumatic. Nose: No congestion/rhinnorhea. Mouth/Throat: Mucous membranes are moist.   Neck: No stridor. Trachea Midline. FROM Cardiovascular: Normal rate, regular rhythm. Grossly normal heart sounds.  Good peripheral circulation.  Equal pulses in her feet and DPs Respiratory: Normal respiratory effort.  No retractions. Lungs CTAB. Gastrointestinal: Soft and nontender. No distention. No abdominal bruits.  Musculoskeletal: No lower extremity tenderness nor edema.  No joint effusions. Neurologic:  Normal speech and language. No gross focal neurologic deficits are appreciated.  Skin:  Skin is warm, dry and intact. No rash noted. Psychiatric: Mood and affect are normal. Speech and behavior are normal. GU: Deferred   ____________________________________________   LABS (all labs ordered are listed, but only abnormal results are displayed)  Labs Reviewed  BASIC METABOLIC PANEL - Abnormal; Notable for the following components:      Result Value   Glucose, Bld 126 (*)    All other components within normal limits  CBC  TROPONIN I  (HIGH SENSITIVITY)  TROPONIN I (HIGH SENSITIVITY)   ____________________________________________   ED ECG REPORT I, 14/11/20, the attending physician, personally viewed and interpreted this ECG.  EKG is normal sinus rate of 80, no ST elevations, no T wave inversions, normal intervals ____________________________________________  RADIOLOGY Concha Se, personally viewed and evaluated these images (plain radiographs) as part of my medical decision making, as well as reviewing the written report by the radiologist.  ED MD interpretation: Chest x-ray no evidence of pneumonia  Official radiology report(s): DG Chest 2 View  Result Date: 08/11/2019 CLINICAL DATA:  Chest pain EXAM: CHEST - 2 VIEW COMPARISON:  None. FINDINGS: The heart size and mediastinal contours are within normal limits. Both  lungs are clear. The visualized skeletal structures are unremarkable. IMPRESSION: No active cardiopulmonary disease. Electronically Signed   By: Prudencio Pair M.D.   On: 08/11/2019 12:43    ____________________________________________   PROCEDURES  Procedure(s) performed (including Critical Care):  Procedures   ____________________________________________   INITIAL IMPRESSION / ASSESSMENT AND PLAN / ED COURSE  Graceanna Theissen Redner was evaluated in Emergency Department on 08/11/2019 for the symptoms described in the history of present illness. She was evaluated in the context of the global COVID-19 pandemic, which necessitated consideration that the patient might be at risk for infection with the SARS-CoV-2 virus that causes COVID-19. Institutional protocols and algorithms that pertain to the evaluation of patients at risk for COVID-19 are in a state of rapid change based on information released by regulatory bodies including the CDC and federal and state organizations. These policies and algorithms were followed during the patient's care in the ED.    Patient is a very well-appearing  58 year old who is laughing and joking with me but states she has had some chest pain and back pain.  The triage note states that it seems like they are related I had extensive conversation with patient that they seem to be separate pains.  She states that the back pain has been going on for 3 months.  Seems more likely musculoskeletal in nature.  She denies any recent falls to suggest fractures.  The chest pain has been going on for the past week or so is intermittent in nature.  Will get labs to evaluate for ACS.  Denies any risk factors for PE and no shortness of breath.  Consider dissection but she is got equal pulses in her hands and her feet, blood pressures are stable in both arms and her chest x-ray without evidence of widened mediastinum.  Given that she is so well-appearing I think that it is very low chance that she has a dissection.  She denies any abdominal pain to suggest cholecystitis, pancreatitis, SBO.  Labs are reassuring.  No anemia.  No white count elevation to suggest infection.  Initial troponin was 3.  Repeat troponin rules her out for ACS.  Reevaluated patient and she continues to feel very well.  Patient feels comfortable with discharge home but given her risk factors she will follow-up with cardiology outpatient.  I discussed the provisional nature of ED diagnosis, the treatment so far, the ongoing plan of care, follow up appointments and return precautions with the patient and any family or support people present. They expressed understanding and agreed with the plan, discharged home.  ____________________________________________   FINAL CLINICAL IMPRESSION(S) / ED DIAGNOSES   Final diagnoses:  Chest pain, unspecified type      MEDICATIONS GIVEN DURING THIS VISIT:  Medications  lidocaine (LIDODERM) 5 % 1 patch (1 patch Transdermal Patch Applied 08/11/19 1725)  acetaminophen (TYLENOL) tablet 1,000 mg (1,000 mg Oral Given 08/11/19 1725)     ED Discharge Orders     None       Note:  This document was prepared using Dragon voice recognition software and may include unintentional dictation errors.   Vanessa Hookerton, MD 08/11/19 [phone]

## 2019-08-11 NOTE — ED Triage Notes (Signed)
Pt reports chest pain today that in her upper back radiating through to her chest. Pt states that her bp has been elevated this past week

## 2019-08-11 NOTE — ED Notes (Signed)
Pt states that she has had recurrent chest pain that feels like "indigestion". Pt reports that this morning the pain was in her central chest and went into her back. Pt reports that she also had some tingling in her left arm. Pt report that she was recently started on Lisinopril for high blood pressure. Pt denies any issues with medication. Pt is in NAD

## 2019-08-11 NOTE — Discharge Instructions (Signed)
Your work-up was reassuring with no evidence of heart attack today however you do have some risk factors therefore you should follow-up with the cardiologist.  Return to ER if your pain gets a lot worse he develop shortness of breath or you have any other concerns.

## 2019-08-11 NOTE — ED Notes (Signed)
Light green tube sent to lab 

## 2019-08-11 NOTE — ED Triage Notes (Signed)
Pt c/o chest pain that started a few weeks ago. Reports back pain and tingling in her left arm along with worsening chest pain that started this morning and h/a. Denies N/V, SHOB.  Reports taking 81mg  aspirin x2 this morning. Pt in NAD at this time

## 2019-09-03 ENCOUNTER — Encounter: Payer: Self-pay | Admitting: Nurse Practitioner

## 2019-09-03 ENCOUNTER — Other Ambulatory Visit: Payer: Self-pay | Admitting: Family Medicine

## 2019-09-03 DIAGNOSIS — R7301 Impaired fasting glucose: Secondary | ICD-10-CM | POA: Insufficient documentation

## 2019-09-03 DIAGNOSIS — I10 Essential (primary) hypertension: Secondary | ICD-10-CM | POA: Insufficient documentation

## 2019-09-06 ENCOUNTER — Ambulatory Visit: Payer: BC Managed Care – PPO | Admitting: Nurse Practitioner

## 2019-09-06 ENCOUNTER — Other Ambulatory Visit: Payer: Self-pay

## 2019-09-06 ENCOUNTER — Encounter: Payer: Self-pay | Admitting: Nurse Practitioner

## 2019-09-06 VITALS — BP 112/71 | HR 73 | Temp 98.1°F

## 2019-09-06 DIAGNOSIS — R7301 Impaired fasting glucose: Secondary | ICD-10-CM

## 2019-09-06 DIAGNOSIS — F418 Other specified anxiety disorders: Secondary | ICD-10-CM

## 2019-09-06 DIAGNOSIS — I10 Essential (primary) hypertension: Secondary | ICD-10-CM | POA: Diagnosis not present

## 2019-09-06 DIAGNOSIS — E7800 Pure hypercholesterolemia, unspecified: Secondary | ICD-10-CM | POA: Diagnosis not present

## 2019-09-06 MED ORDER — BUSPIRONE HCL 5 MG PO TABS: 5.0000 mg | ORAL_TABLET | Freq: Two times a day (BID) | ORAL | 3 refills | Status: DC

## 2019-09-06 MED ORDER — LISINOPRIL 10 MG PO TABS: 10.0000 mg | ORAL_TABLET | Freq: Every day | ORAL | 3 refills | Status: DC

## 2019-09-06 NOTE — Assessment & Plan Note (Signed)
New onset with care giving and loss of parent.  GAD 13 and PHQ9 = 6.  Discussed all options with patient.  At this time she would like to trial Buspar, but would like to try as needed.  Will place script.  Denies SI/HI.  Recommend use of Melatonin for sleep.  Return in 4 weeks for follow-up

## 2019-09-06 NOTE — Assessment & Plan Note (Addendum)
Chronic, ongoing.  Improved on visit today, but continues to have frequent elevations above goal at home.  Recent K+ 3.7 at ER.  Will trial increase in Lisinopril to 10 MG daily and assess.  Script sent.  Continue to monitor BP at home.  CMP, lipid panel, and TSH today.  Return in 4 weeks.

## 2019-09-06 NOTE — Progress Notes (Signed)
BP 112/71 (BP Location: Left Arm, Patient Position: Sitting, Cuff Size: Normal)   Pulse 73   Temp 98.1 F (36.7 C) (Oral)   LMP  (LMP Unknown)   SpO2 96%    Subjective:    Patient ID: Stacie Bell, female    DOB: Oct 21, 1960, 59 y.o.   MRN: [id number]  HPI: Stacie Bell is a 59 y.o. female  Chief Complaint  Patient presents with  . Hypertension    fu. has not had medication today. stress has come down.   HYPERTENSION Continues on Lisinopril 5 MG daily and ASA. Was seen in ER on 08/11/2019 for CP, was determined to be more musculoskeletal in nature. Was under a lot of stress with parents at time, her mother just passed away. Mother had kidney failure and diabetes.   Hypertension status: stable  Satisfied with current treatment? yes Duration of hypertension: chronic BP monitoring frequency:  a few times a week BP range: 142/93 this morning, average 125-136/90's BP medication side effects:  no Medication compliance: good compliance Previous BP meds:Lisinopril Aspirin: yes Recurrent headaches: no Visual changes: no Palpitations: no Dyspnea: no Chest pain: one episode with recent anxiety Lower extremity edema: no Dizzy/lightheaded: no   IFG: Last A1C in 2018 and was 5.1% Polydipsia/polyuria: no Visual disturbance: no Chest pain: no Paresthesias: no   ANXIETY/STRESS Had been on Prozac several years ago, took as needed and not daily.  Does endorse increased anxiety recently, no history of family members with depression or anxiety.  Anxiety with caring for ill parents and recent loss of her mother.  Discussed at length current treatment guidelines.  She wishes try something she can take as needed, will trial Buspar and assess if benefit. Duration:uncontrolled Anxious mood: yes  Excessive worrying: yes Irritability: no  Sweating: no Nausea: no Palpitations:no Hyperventilation: no Panic attacks: no Agoraphobia: no  Obscessions/compulsions: no Depressed mood:  no Depression screen [facility] 2/9 09/06/2019 08/03/2019 04/04/2018  Decreased Interest 1 0 0  Down, Depressed, Hopeless 0 1 0  PHQ - 2 Score 1 1 0  Altered sleeping 2 1 0  Tired, decreased energy 2 1 1   Change in appetite 0 0 0  Feeling bad or failure about yourself  0 0 0  Trouble concentrating 1 1 0  Moving slowly or fidgety/restless 0 0 0  Suicidal thoughts 0 0 0  PHQ-9 Score 6 4 1   Difficult doing work/chores Not difficult at all - Not difficult at all   Anhedonia: no Weight changes: no Insomnia: yes hard to fall asleep  Hypersomnia: no Fatigue/loss of energy: no Feelings of worthlessness: no Feelings of guilt: no Impaired concentration/indecisiveness: yes Suicidal ideations: no  Crying spells: no Recent Stressors/Life Changes: yes   Relationship problems: no   Family stress: no     Financial stress: no    Job stress: no    Recent death/loss: yes GAD 7 : Generalized Anxiety Score 09/06/2019 08/03/2019  Nervous, Anxious, on Edge 3 1  Control/stop worrying 3 0  Worry too much - different things 3 1  Trouble relaxing 3 1  Restless 1 0  Easily annoyed or irritable 0 1  Afraid - awful might happen 0 0  Total GAD 7 Score 13 4  Anxiety Difficulty Somewhat difficult Somewhat difficult    Relevant past medical, surgical, family and social history reviewed and updated as indicated. Interim medical history since our last visit reviewed. Allergies and medications reviewed and updated.  Review of Systems  Constitutional: Negative  for activity change, appetite change, diaphoresis, fatigue and fever.  Respiratory: Negative for cough, chest tightness, shortness of breath and wheezing.   Cardiovascular: Negative for chest pain, palpitations and leg swelling.  Gastrointestinal: Negative.   Endocrine: Negative.   Neurological: Negative.   Psychiatric/Behavioral: Positive for decreased concentration and sleep disturbance. Negative for self-injury and suicidal ideas. The patient is  nervous/anxious.     Per HPI unless specifically indicated above     Objective:    BP 112/71 (BP Location: Left Arm, Patient Position: Sitting, Cuff Size: Normal)   Pulse 73   Temp 98.1 F (36.7 C) (Oral)   LMP  (LMP Unknown)   SpO2 96%   Wt Readings from Last 3 Encounters:  08/11/19 200 lb (90.7 kg)  08/03/19 201 lb (91.2 kg)  08/01/19 200 lb (90.7 kg)    Physical Exam Vitals and nursing note reviewed.  Constitutional:      General: She is awake. She is not in acute distress.    Appearance: She is well-developed and overweight. She is not ill-appearing.  HENT:     Head: Normocephalic.     Right Ear: Hearing normal.     Left Ear: Hearing normal.  Eyes:     General: Lids are normal.        Right eye: No discharge.        Left eye: No discharge.     Conjunctiva/sclera: Conjunctivae normal.     Pupils: Pupils are equal, round, and reactive to light.  Neck:     Thyroid: No thyromegaly.     Vascular: No carotid bruit.  Cardiovascular:     Rate and Rhythm: Normal rate and regular rhythm.     Heart sounds: Normal heart sounds. No murmur. No gallop.   Pulmonary:     Effort: Pulmonary effort is normal. No accessory muscle usage or respiratory distress.     Breath sounds: Normal breath sounds.  Abdominal:     General: Bowel sounds are normal.     Palpations: Abdomen is soft.  Musculoskeletal:     Cervical back: Normal range of motion and neck supple.     Right lower leg: No edema.     Left lower leg: No edema.  Skin:    General: Skin is warm and dry.  Neurological:     Mental Status: She is alert and oriented to person, place, and time.  Psychiatric:        Attention and Perception: Attention normal.        Mood and Affect: Mood normal.        Speech: Speech normal.        Behavior: Behavior normal. Behavior is cooperative.        Thought Content: Thought content normal.     Results for orders placed or performed during the hospital encounter of 08/11/19  Basic  metabolic panel  Result Value Ref Range   Sodium 139 135 - 145 mmol/L   Potassium 3.7 3.5 - 5.1 mmol/L   Chloride 105 98 - 111 mmol/L   CO2 23 22 - 32 mmol/L   Glucose, Bld 126 (H) 70 - 99 mg/dL   BUN 11 6 - 20 mg/dL   Creatinine, Ser 7.25 0.44 - 1.00 mg/dL   Calcium 9.3 8.9 - 36.6 mg/dL   GFR calc non Af Amer >60 >60 mL/min   GFR calc Af Amer >60 >60 mL/min   Anion gap 11 5 - 15  CBC  Result Value Ref Range  WBC 7.6 4.0 - 10.5 K/uL   RBC 4.81 3.87 - 5.11 MIL/uL   Hemoglobin 14.2 12.0 - 15.0 g/dL   HCT 41.3 36.0 - 46.0 %   MCV 85.9 80.0 - 100.0 fL   MCH 29.5 26.0 - 34.0 pg   MCHC 34.4 30.0 - 36.0 g/dL   RDW 12.5 11.5 - 15.5 %   Platelets 214 150 - 400 K/uL   nRBC 0.0 0.0 - 0.2 %  Troponin I (High Sensitivity)  Result Value Ref Range   Troponin I (High Sensitivity) 3 <18 ng/L  Troponin I (High Sensitivity)  Result Value Ref Range   Troponin I (High Sensitivity) 3 <18 ng/L      Assessment & Plan:   Problem List Items Addressed This Visit      Cardiovascular and Mediastinum   Hypertension - Primary    Chronic, ongoing.  Improved on visit today, but continues to have frequent elevations above goal at home.  Recent K+ 3.7 at ER.  Will trial increase in Lisinopril to 10 MG daily and assess.  Script sent.  Continue to monitor BP at home.  CMP, lipid panel, and TSH today.  Return in 4 weeks.      Relevant Medications   lisinopril (ZESTRIL) 10 MG tablet   Other Relevant Orders   Comprehensive metabolic panel   Lipid Panel w/o Chol/HDL Ratio out   TSH     Endocrine   IFG (impaired fasting glucose)    Recheck A1C today.        Relevant Orders   HgB A1c     Other   Situational anxiety    New onset with care giving and loss of parent.  GAD 13 and PHQ9 = 6.  Discussed all options with patient.  At this time she would like to trial Buspar, but would like to try as needed.  Will place script.  Denies SI/HI.  Recommend use of Melatonin for sleep.  Return in 4 weeks for  follow-up      Relevant Medications   busPIRone (BUSPAR) 5 MG tablet    Other Visit Diagnoses    Elevated LDL cholesterol level       Reports history of elevation, will check today.   Relevant Orders   Lipid Panel w/o Chol/HDL Ratio out       Follow up plan: Return in about 4 weeks (around 10/04/2019) for HTN and Anxiety.

## 2019-09-06 NOTE — Patient Instructions (Signed)

## 2019-09-06 NOTE — Assessment & Plan Note (Signed)
-   Recheck A1C today

## 2019-09-07 LAB — COMPREHENSIVE METABOLIC PANEL
ALT: 9 IU/L (ref 0–32)
AST: 12 IU/L (ref 0–40)
Albumin/Globulin Ratio: 2 (ref 1.2–2.2)
Albumin: 4.5 g/dL (ref 3.8–4.9)
Alkaline Phosphatase: 70 IU/L (ref 39–117)
BUN/Creatinine Ratio: 16 (ref 9–23)
BUN: 11 mg/dL (ref 6–24)
Bilirubin Total: 0.3 mg/dL (ref 0.0–1.2)
CO2: 25 mmol/L (ref 20–29)
Calcium: 9.3 mg/dL (ref 8.7–10.2)
Chloride: 103 mmol/L (ref 96–106)
Creatinine, Ser: 0.67 mg/dL (ref 0.57–1.00)
GFR calc Af Amer: 112 mL/min/{1.73_m2} (ref >59)
GFR calc non Af Amer: 97 mL/min/{1.73_m2} (ref >59)
Globulin, Total: 2.2 g/dL (ref 1.5–4.5)
Glucose: 86 mg/dL (ref 65–99)
Potassium: 3.9 mmol/L (ref 3.5–5.2)
Sodium: 140 mmol/L (ref 134–144)
Total Protein: 6.7 g/dL (ref 6.0–8.5)

## 2019-09-07 LAB — HEMOGLOBIN A1C
Est. average glucose Bld gHb Est-mCnc: 108 mg/dL
Hgb A1c MFr Bld: 5.4 % (ref 4.8–5.6)

## 2019-09-07 LAB — LIPID PANEL W/O CHOL/HDL RATIO
Cholesterol, Total: 258 mg/dL — ABNORMAL HIGH (ref 100–199)
HDL: 43 mg/dL (ref >39)
LDL Chol Calc (NIH): 175 mg/dL — ABNORMAL HIGH (ref 0–99)
Triglycerides: 213 mg/dL — ABNORMAL HIGH (ref 0–149)
VLDL Cholesterol Cal: 40 mg/dL (ref 5–40)

## 2019-09-07 LAB — TSH: TSH: 1.75 u[IU]/mL (ref 0.450–4.500)

## 2019-09-07 NOTE — Progress Notes (Signed)
Good morning, please let Stacie Bell know the following: - Kidney and liver function are normal - Thyroid testing is normal and she has no diabetes - Cholesterol levels are elevated, but at this time continued recommendation to focus on diet.  Do not need to start medications at this time, however overtime if these remain elevated as she ages we may need to discuss medication to help prevent heart risks.   Thank you and have a wonderful day!!  Will see you in 4 weeks.

## 2019-10-04 ENCOUNTER — Encounter: Payer: Self-pay | Admitting: Nurse Practitioner

## 2019-10-04 ENCOUNTER — Ambulatory Visit: Payer: BC Managed Care – PPO | Admitting: Nurse Practitioner

## 2019-10-04 ENCOUNTER — Other Ambulatory Visit: Payer: Self-pay

## 2019-10-04 VITALS — BP 110/73 | HR 76 | Temp 98.1°F

## 2019-10-04 DIAGNOSIS — I10 Essential (primary) hypertension: Secondary | ICD-10-CM

## 2019-10-04 DIAGNOSIS — K219 Gastro-esophageal reflux disease without esophagitis: Secondary | ICD-10-CM

## 2019-10-04 DIAGNOSIS — F418 Other specified anxiety disorders: Secondary | ICD-10-CM

## 2019-10-04 MED ORDER — CYCLOBENZAPRINE HCL 5 MG PO TABS: 5.0000 mg | ORAL_TABLET | Freq: Three times a day (TID) | ORAL | 0 refills | Status: DC | PRN

## 2019-10-04 MED ORDER — PANTOPRAZOLE SODIUM 40 MG PO TBEC: 40.0000 mg | DELAYED_RELEASE_TABLET | Freq: Every day | ORAL | 5 refills | Status: DC

## 2019-10-04 NOTE — Assessment & Plan Note (Signed)
Improved with addition of Buspar as needed.  She has seen improvement with this.  GAD has improved from 13 to 5.  Continue current medication regimen and adjust as needed.  Has refills.  Return to office in 6 months.

## 2019-10-04 NOTE — Assessment & Plan Note (Signed)
Suspect recent CP event related to anxiety and GERD, has had improvements with Buspar and Protonix as needed.  Will continue Protonix on as needed basis at this time, as patient prefers not to take daily medication.  Return in 6 months or sooner if worsening symptoms.  Refills sent in.

## 2019-10-04 NOTE — Assessment & Plan Note (Signed)
Chronic, stable with BP at goal with medication changes. Continue Lisinopril 10 MG daily and adjust as needed.  Continue to monitor BP at home.  BMP today to check with dose increase. Return in 6 months or sooner if any changes.

## 2019-10-04 NOTE — Patient Instructions (Signed)
DASH Eating Plan DASH stands for "Dietary Approaches to Stop Hypertension." The DASH eating plan is a healthy eating plan that has been shown to reduce high blood pressure (hypertension). It may also reduce your risk for type 2 diabetes, heart disease, and stroke. The DASH eating plan may also help with weight loss. What are tips for following this plan?  General guidelines  Avoid eating more than 2,300 mg (milligrams) of salt (sodium) a day. If you have hypertension, you may need to reduce your sodium intake to 1,500 mg a day.  Limit alcohol intake to no more than 1 drink a day for nonpregnant women and 2 drinks a day for men. One drink equals 12 oz of beer, 5 oz of wine, or 1 oz of hard liquor.  Work with your health care provider to maintain a healthy body weight or to lose weight. Ask what an ideal weight is for you.  Get at least 30 minutes of exercise that causes your heart to beat faster (aerobic exercise) most days of the week. Activities may include walking, swimming, or biking.  Work with your health care provider or diet and nutrition specialist (dietitian) to adjust your eating plan to your individual calorie needs. Reading food labels   Check food labels for the amount of sodium per serving. Choose foods with less than 5 percent of the Daily Value of sodium. Generally, foods with less than 300 mg of sodium per serving fit into this eating plan.  To find whole grains, look for the word "whole" as the first word in the ingredient list. Shopping  Buy products labeled as "low-sodium" or "no salt added."  Buy fresh foods. Avoid canned foods and premade or frozen meals. Cooking  Avoid adding salt when cooking. Use salt-free seasonings or herbs instead of table salt or sea salt. Check with your health care provider or pharmacist before using salt substitutes.  Do not fry foods. Cook foods using healthy methods such as baking, boiling, grilling, and broiling instead.  Cook with  heart-healthy oils, such as olive, canola, soybean, or sunflower oil. Meal planning  Eat a balanced diet that includes: ? 5 or more servings of fruits and vegetables each day. At each meal, try to fill half of your plate with fruits and vegetables. ? Up to 6-8 servings of whole grains each day. ? Less than 6 oz of lean meat, poultry, or fish each day. A 3-oz serving of meat is about the same size as a deck of cards. One egg equals 1 oz. ? 2 servings of low-fat dairy each day. ? A serving of nuts, seeds, or beans 5 times each week. ? Heart-healthy fats. Healthy fats called Omega-3 fatty acids are found in foods such as flaxseeds and coldwater fish, like sardines, salmon, and mackerel.  Limit how much you eat of the following: ? Canned or prepackaged foods. ? Food that is high in trans fat, such as fried foods. ? Food that is high in saturated fat, such as fatty meat. ? Sweets, desserts, sugary drinks, and other foods with added sugar. ? Full-fat dairy products.  Do not salt foods before eating.  Try to eat at least 2 vegetarian meals each week.  Eat more home-cooked food and less restaurant, buffet, and fast food.  When eating at a restaurant, ask that your food be prepared with less salt or no salt, if possible. What foods are recommended? The items listed may not be a complete list. Talk with your dietitian about   what dietary choices are best for you. Grains Whole-grain or whole-wheat bread. Whole-grain or whole-wheat pasta. Brown rice. Oatmeal. Quinoa. Bulgur. Whole-grain and low-sodium cereals. Pita bread. Low-fat, low-sodium crackers. Whole-wheat flour tortillas. Vegetables Fresh or frozen vegetables (raw, steamed, roasted, or grilled). Low-sodium or reduced-sodium tomato and vegetable juice. Low-sodium or reduced-sodium tomato sauce and tomato paste. Low-sodium or reduced-sodium canned vegetables. Fruits All fresh, dried, or frozen fruit. Canned fruit in natural juice (without  added sugar). Meat and other protein foods Skinless chicken or turkey. Ground chicken or turkey. Pork with fat trimmed off. Fish and seafood. Egg whites. Dried beans, peas, or lentils. Unsalted nuts, nut butters, and seeds. Unsalted canned beans. Lean cuts of beef with fat trimmed off. Low-sodium, lean deli meat. Dairy Low-fat (1%) or fat-free (skim) milk. Fat-free, low-fat, or reduced-fat cheeses. Nonfat, low-sodium ricotta or cottage cheese. Low-fat or nonfat yogurt. Low-fat, low-sodium cheese. Fats and oils Soft margarine without trans fats. Vegetable oil. Low-fat, reduced-fat, or light mayonnaise and salad dressings (reduced-sodium). Canola, safflower, olive, soybean, and sunflower oils. Avocado. Seasoning and other foods Herbs. Spices. Seasoning mixes without salt. Unsalted popcorn and pretzels. Fat-free sweets. What foods are not recommended? The items listed may not be a complete list. Talk with your dietitian about what dietary choices are best for you. Grains Baked goods made with fat, such as croissants, muffins, or some breads. Dry pasta or rice meal packs. Vegetables Creamed or fried vegetables. Vegetables in a cheese sauce. Regular canned vegetables (not low-sodium or reduced-sodium). Regular canned tomato sauce and paste (not low-sodium or reduced-sodium). Regular tomato and vegetable juice (not low-sodium or reduced-sodium). Pickles. Olives. Fruits Canned fruit in a light or heavy syrup. Fried fruit. Fruit in cream or butter sauce. Meat and other protein foods Fatty cuts of meat. Ribs. Fried meat. Bacon. Sausage. Bologna and other processed lunch meats. Salami. Fatback. Hotdogs. Bratwurst. Salted nuts and seeds. Canned beans with added salt. Canned or smoked fish. Whole eggs or egg yolks. Chicken or turkey with skin. Dairy Whole or 2% milk, cream, and half-and-half. Whole or full-fat cream cheese. Whole-fat or sweetened yogurt. Full-fat cheese. Nondairy creamers. Whipped toppings.  Processed cheese and cheese spreads. Fats and oils Butter. Stick margarine. Lard. Shortening. Ghee. Bacon fat. Tropical oils, such as coconut, palm kernel, or palm oil. Seasoning and other foods Salted popcorn and pretzels. Onion salt, garlic salt, seasoned salt, table salt, and sea salt. Worcestershire sauce. Tartar sauce. Barbecue sauce. Teriyaki sauce. Soy sauce, including reduced-sodium. Steak sauce. Canned and packaged gravies. Fish sauce. Oyster sauce. Cocktail sauce. Horseradish that you find on the shelf. Ketchup. Mustard. Meat flavorings and tenderizers. Bouillon cubes. Hot sauce and Tabasco sauce. Premade or packaged marinades. Premade or packaged taco seasonings. Relishes. Regular salad dressings. Where to find more information:  National Heart, Lung, and Blood Institute: www.nhlbi.nih.gov  American Heart Association: www.heart.org Summary  The DASH eating plan is a healthy eating plan that has been shown to reduce high blood pressure (hypertension). It may also reduce your risk for type 2 diabetes, heart disease, and stroke.  With the DASH eating plan, you should limit salt (sodium) intake to 2,300 mg a day. If you have hypertension, you may need to reduce your sodium intake to 1,500 mg a day.  When on the DASH eating plan, aim to eat more fresh fruits and vegetables, whole grains, lean proteins, low-fat dairy, and heart-healthy fats.  Work with your health care provider or diet and nutrition specialist (dietitian) to adjust your eating plan to your   individual calorie needs. This information is not intended to replace advice given to you by your health care provider. Make sure you discuss any questions you have with your health care provider. Document Revised: 07/30/2017 Document Reviewed: 08/10/2016 Elsevier Patient Education  2020 Elsevier Inc.  

## 2019-10-04 NOTE — Progress Notes (Signed)
BP 110/73   Pulse 76   Temp 98.1 F (36.7 C) (Oral)   LMP  (LMP Unknown)   SpO2 98%    Subjective:    Patient ID: Stacie Bell, female    DOB: 25-Jun-1961, 59 y.o.   MRN: [id number]  HPI: Stacie Bell is a 59 y.o. female  Chief Complaint  Patient presents with  . Anxiety  . Hypertension   ANXIETY/STRESS Does endorse increased anxiety recently, no history of family members with depression or anxiety.  Anxiety with caring for ill parents and recent loss of her mother. Discussed at length current treatment guidelines.  We started on Buspar at last visit on 09/06/2019, has used about 5-6 pills and this helps her.   Duration:stable Anxious mood: sometimes, takes Buspar and it helps  Excessive worrying: no Irritability: no  Sweating: no Nausea: no Palpitations:no Hyperventilation: no Panic attacks: no Agoraphobia: no  Obscessions/compulsions: no Depressed mood: no Depression screen [facility] 2/9 10/04/2019 09/06/2019 08/03/2019 04/04/2018  Decreased Interest 1 1 0 0  Down, Depressed, Hopeless 0 0 1 0  PHQ - 2 Score 1 1 1  0  Altered sleeping 1 2 1  0  Tired, decreased energy 1 2 1 1   Change in appetite 1 0 0 0  Feeling bad or failure about yourself  0 0 0 0  Trouble concentrating 1 1 1  0  Moving slowly or fidgety/restless 0 0 0 0  Suicidal thoughts 0 0 0 0  PHQ-9 Score 5 6 4 1   Difficult doing work/chores Not difficult at all Not difficult at all - Not difficult at all   Anhedonia: no Weight changes: no Insomnia: occasional Hypersomnia: no Fatigue/loss of energy: sometimes Feelings of worthlessness: no Feelings of guilt: no Impaired concentration/indecisiveness: no Suicidal ideations: no  Crying spells: no Recent Stressors/Life Changes: yes   Relationship problems: no   Family stress: yes     Financial stress: no    Job stress: no    Recent death/loss: no GAD 7 : Generalized Anxiety Score 10/04/2019 09/06/2019 08/03/2019  Nervous, Anxious, on Edge 1 3 1   Control/stop  worrying 2 3 0  Worry too much - different things 1 3 1   Trouble relaxing 1 3 1   Restless 0 1 0  Easily annoyed or irritable 0 0 1  Afraid - awful might happen 0 0 0  Total GAD 7 Score 5 13 4   Anxiety Difficulty Not difficult at all Somewhat difficult Somewhat difficult     HYPERTENSION Continues on Lisinopril 10 MG daily (increased last visit) and ASA.  Had recent episode of ER visit for CP 08/11/2019.  Denies any recent CP, but did have some pressure recently and took Protonix which helped improve discomfort.  Denies N&V.  She reports it is like pressure and squeezing sensation at times, if sneezes or burps pressure comes off.  Protonix was prescribed to her at [facility] and she takes as needed only.  Endorses recently eating more fried food and fast food.   Hypertension status: stable  Satisfied with current treatment? yes Duration of hypertension: chronic BP monitoring frequency:  a few times a week BP range: this morning before medication -- 128/89 BP medication side effects:  no Medication compliance: good compliance Previous BP meds:Lisinopril Aspirin: yes Recurrent headaches: no Visual changes: no Palpitations: no Dyspnea: no Chest pain: no Lower extremity edema: no Dizzy/lightheaded: no   Relevant past medical, surgical, family and social history reviewed and updated as indicated. Interim medical history since our  last visit reviewed. Allergies and medications reviewed and updated.  Review of Systems  Constitutional: Negative for activity change, appetite change, diaphoresis, fatigue and fever.  Respiratory: Negative for cough, chest tightness, shortness of breath and wheezing.   Cardiovascular: Negative for chest pain, palpitations and leg swelling.  Gastrointestinal: Negative.   Endocrine: Negative.   Neurological: Negative.   Psychiatric/Behavioral: Positive for sleep disturbance (occasional). Negative for decreased concentration, self-injury and suicidal ideas. The  patient is not nervous/anxious.     Per HPI unless specifically indicated above     Objective:    BP 110/73   Pulse 76   Temp 98.1 F (36.7 C) (Oral)   LMP  (LMP Unknown)   SpO2 98%   Wt Readings from Last 3 Encounters:  08/11/19 200 lb (90.7 kg)  08/03/19 201 lb (91.2 kg)  08/01/19 200 lb (90.7 kg)    Physical Exam Vitals and nursing note reviewed.  Constitutional:      General: She is awake. She is not in acute distress.    Appearance: She is well-developed and overweight. She is not ill-appearing.  HENT:     Head: Normocephalic.     Right Ear: Hearing normal.     Left Ear: Hearing normal.  Eyes:     General: Lids are normal.        Right eye: No discharge.        Left eye: No discharge.     Conjunctiva/sclera: Conjunctivae normal.     Pupils: Pupils are equal, round, and reactive to light.  Neck:     Thyroid: No thyromegaly.     Vascular: No carotid bruit.  Cardiovascular:     Rate and Rhythm: Normal rate and regular rhythm.     Heart sounds: Normal heart sounds. No murmur. No gallop.   Pulmonary:     Effort: Pulmonary effort is normal. No accessory muscle usage or respiratory distress.     Breath sounds: Normal breath sounds.  Abdominal:     General: Bowel sounds are normal.     Palpations: Abdomen is soft.  Musculoskeletal:     Cervical back: Normal range of motion and neck supple.     Right lower leg: No edema.     Left lower leg: No edema.  Skin:    General: Skin is warm and dry.  Neurological:     Mental Status: She is alert and oriented to person, place, and time.  Psychiatric:        Attention and Perception: Attention normal.        Mood and Affect: Mood normal.        Speech: Speech normal.        Behavior: Behavior normal. Behavior is cooperative.        Thought Content: Thought content normal.     Results for orders placed or performed in visit on 09/06/19  Comprehensive metabolic panel  Result Value Ref Range   Glucose 86 65 - 99 mg/dL     BUN 11 6 - 24 mg/dL   Creatinine, Ser 0.67 0.57 - 1.00 mg/dL   GFR calc non Af Amer 97 >59 mL/min/1.73   GFR calc Af Amer 112 >59 mL/min/1.73   BUN/Creatinine Ratio 16 9 - 23   Sodium 140 134 - 144 mmol/L   Potassium 3.9 3.5 - 5.2 mmol/L   Chloride 103 96 - 106 mmol/L   CO2 25 20 - 29 mmol/L   Calcium 9.3 8.7 - 10.2 mg/dL   Total Protein  6.7 6.0 - 8.5 g/dL   Albumin 4.5 3.8 - 4.9 g/dL   Globulin, Total 2.2 1.5 - 4.5 g/dL   Albumin/Globulin Ratio 2.0 1.2 - 2.2   Bilirubin Total 0.3 0.0 - 1.2 mg/dL   Alkaline Phosphatase 70 39 - 117 IU/L   AST 12 0 - 40 IU/L   ALT 9 0 - 32 IU/L  Lipid Panel w/o Chol/HDL Ratio out  Result Value Ref Range   Cholesterol, Total 258 (H) 100 - 199 mg/dL   Triglycerides 366 (H) 0 - 149 mg/dL   HDL 43 >29 mg/dL   VLDL Cholesterol Cal 40 5 - 40 mg/dL   LDL Chol Calc (NIH) 476 (H) 0 - 99 mg/dL  TSH  Result Value Ref Range   TSH 1.750 0.450 - 4.500 uIU/mL  HgB A1c  Result Value Ref Range   Hgb A1c MFr Bld 5.4 4.8 - 5.6 %   Est. average glucose Bld gHb Est-mCnc 108 mg/dL      Assessment & Plan:   Problem List Items Addressed This Visit      Cardiovascular and Mediastinum   Hypertension - Primary    Chronic, stable with BP at goal with medication changes. Continue Lisinopril 10 MG daily and adjust as needed.  Continue to monitor BP at home.  BMP today to check with dose increase. Return in 6 months or sooner if any changes.      Relevant Orders   Basic Metabolic Panel (BMET)     Digestive   Acid reflux    Suspect recent CP event related to anxiety and GERD, has had improvements with Buspar and Protonix as needed.  Will continue Protonix on as needed basis at this time, as patient prefers not to take daily medication.  Return in 6 months or sooner if worsening symptoms.  Refills sent in.      Relevant Medications   pantoprazole (PROTONIX) 40 MG tablet     Other   Situational anxiety    Improved with addition of Buspar as needed.  She has  seen improvement with this.  GAD has improved from 13 to 5.  Continue current medication regimen and adjust as needed.  Has refills.  Return to office in 6 months.          Follow up plan: Return in about 6 months (around 04/02/2020) for HTN and Anxiety.

## 2019-10-05 LAB — BASIC METABOLIC PANEL
BUN/Creatinine Ratio: 22 (ref 9–23)
BUN: 15 mg/dL (ref 6–24)
CO2: 23 mmol/L (ref 20–29)
Calcium: 9.7 mg/dL (ref 8.7–10.2)
Chloride: 105 mmol/L (ref 96–106)
Creatinine, Ser: 0.68 mg/dL (ref 0.57–1.00)
GFR calc Af Amer: 112 mL/min/{1.73_m2} (ref >59)
GFR calc non Af Amer: 97 mL/min/{1.73_m2} (ref >59)
Glucose: 87 mg/dL (ref 65–99)
Potassium: 4.1 mmol/L (ref 3.5–5.2)
Sodium: 143 mmol/L (ref 134–144)

## 2019-10-05 NOTE — Progress Notes (Signed)
Good morning, let Stacie Bell know her labs look perfect.  No changes needed.

## 2019-10-30 ENCOUNTER — Other Ambulatory Visit: Payer: Self-pay | Admitting: Unknown Physician Specialty

## 2019-10-30 DIAGNOSIS — Z1231 Encounter for screening mammogram for malignant neoplasm of breast: Secondary | ICD-10-CM

## 2019-11-10 ENCOUNTER — Encounter: Payer: Self-pay | Admitting: Nurse Practitioner

## 2019-11-28 ENCOUNTER — Other Ambulatory Visit: Payer: Self-pay

## 2019-11-28 ENCOUNTER — Ambulatory Visit (INDEPENDENT_AMBULATORY_CARE_PROVIDER_SITE_OTHER): Payer: BC Managed Care – PPO | Admitting: Obstetrics and Gynecology

## 2019-11-28 ENCOUNTER — Encounter: Payer: Self-pay | Admitting: Obstetrics and Gynecology

## 2019-11-28 VITALS — BP 100/76 | Ht 65.0 in | Wt 203.0 lb

## 2019-11-28 DIAGNOSIS — Z1231 Encounter for screening mammogram for malignant neoplasm of breast: Secondary | ICD-10-CM

## 2019-11-28 DIAGNOSIS — N760 Acute vaginitis: Secondary | ICD-10-CM | POA: Diagnosis not present

## 2019-11-28 DIAGNOSIS — Z01411 Encounter for gynecological examination (general) (routine) with abnormal findings: Secondary | ICD-10-CM | POA: Diagnosis not present

## 2019-11-28 DIAGNOSIS — Z01419 Encounter for gynecological examination (general) (routine) without abnormal findings: Secondary | ICD-10-CM

## 2019-11-28 MED ORDER — CLOTRIMAZOLE-BETAMETHASONE 1-0.05 % EX CREA: TOPICAL_CREAM | CUTANEOUS | 0 refills | Status: DC

## 2019-11-28 NOTE — Progress Notes (Signed)
PCP: [name], NP   Chief Complaint  Patient presents with  . Gynecologic Exam    HPI:      [name] is a 59 y.o. G2P1001 who LMP was No LMP recorded (lmp unknown). Patient is postmenopausal., presents today for her annual examination.  Her menses are absent due to menopause. She does not have intermenstrual bleeding. She does not have vasomotor sx.  Sex activity: not sex active Has vaginal itching near mons that comes and goes. Treated last yr with lotrisone crm with sx relief. Uses periodically, needs RF. Pt tries to keep area dry. Last Pap: 08/02/18 Results were: no abnormalities /NEG HPV DNA; same results 9/18. Hx of pos HPV DNA 2016 and 2017.  Hx of STDs: HPV  Last mammogram: 07/19/18  Results were: normal--routine follow-up in 12 months. Has appt 4/21. There is a FH of breast cancer in her mat aunt and [facility], genetic testing not indicated for pt. There is no FH of ovarian cancer. The patient does do self-breast exams.  Tobacco use: The patient denies current or previous tobacco use. Alcohol use: none  No drug use Exercise: min active  She does get adequate calcium and Vitamin D in her diet.  Colonoscopy: 08/2017 without abn. Repeat due after 10 yrs. FH colon cancer in [facility].   Past Medical History:  Diagnosis Date  . CIN I (cervical intraepithelial neoplasia I) 07/2012  . 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 Hyperlipidemia   . Hypertension   . Sleep apnea     Past Surgical History:  Procedure Laterality Date  . arm muscle repair    . TONSILLECTOMY      Family History  Problem Relation Age of Onset  . Diabetes Mother   . Heart disease Mother   . Hypertension Mother   . Clotting disorder Father   . Bladder Cancer Father 74  . Hypertension Father   . Breast cancer Maternal Aunt 60  . Colon cancer Maternal Grandmother 41  . Hypertension Brother     Social History   Socioeconomic History  . Marital status: Married    Spouse name: Not on  file  . Number of children: Not on file  . Years of education: Not on file  . Highest education level: Not on file  Occupational History  . Not on file  Tobacco Use  . Smoking status: Never Smoker  . Smokeless tobacco: Never Used  Substance and Sexual Activity  . Alcohol use: Not Currently    Alcohol/week: 2.0 standard drinks    Types: 2 Standard drinks or equivalent per week    Comment: socially  . Drug use: No  . Sexual activity: Not Currently    Partners: Male    Birth control/protection: Post-menopausal  Other Topics Concern  . Not on file  Social History Narrative  . Not on file   Social Determinants of Health   Financial Resource Strain:   . Difficulty of Paying Living Expenses:   Food Insecurity:   . Worried About Programme researcher, broadcasting/film/video in the Last Year:   . Barista in the Last Year:   Transportation Needs:   . Freight forwarder (Medical):   Marland Kitchen Lack of Transportation (Non-Medical):   Physical Activity:   . Days of Exercise per Week:   . Minutes of Exercise per Session:   Stress:   . Feeling of Stress :   Social Connections:   . Frequency of Communication with Friends  and Family:   . Frequency of Social Gatherings with Friends and Family:   . Attends Religious Services:   . Active Member of Clubs or Organizations:   . Attends Archivist Meetings:   Marland Kitchen Marital Status:   Intimate Partner Violence:   . Fear of Current or Ex-Partner:   . Emotionally Abused:   Marland Kitchen Physically Abused:   . Sexually Abused:     Current Meds  Medication Sig  . aspirin EC 81 MG tablet Take 81 mg by mouth daily.  Marland Kitchen BIOTIN PO Take by mouth daily.  . busPIRone (BUSPAR) 5 MG tablet Take 1 tablet (5 mg total) by mouth 2 (two) times daily.  . Cyanocobalamin (VITAMIN B 12 PO) Take by mouth daily.  . cyclobenzaprine (FLEXERIL) 5 MG tablet Take 1 tablet (5 mg total) by mouth 3 (three) times daily as needed for muscle spasms.  Marland Kitchen lisinopril (ZESTRIL) 10 MG tablet Take 1  tablet (10 mg total) by mouth daily.  . Multiple Vitamin (MULTIVITAMIN) capsule Take 1 capsule by mouth daily.  . Omega-3 Fatty Acids (FISH OIL PO) Take by mouth daily.  . pantoprazole (PROTONIX) 40 MG tablet Take 1 tablet (40 mg total) by mouth daily before breakfast.      ROS:  Review of Systems  Constitutional: Positive for fatigue. Negative for fever and unexpected weight change.  Respiratory: Negative for cough, shortness of breath and wheezing.   Cardiovascular: Negative for chest pain, palpitations and leg swelling.  Gastrointestinal: Negative for blood in stool, constipation, diarrhea, nausea and vomiting.  Endocrine: Negative for cold intolerance, heat intolerance and polyuria.  Genitourinary: Negative for dyspareunia, dysuria, flank pain, frequency, genital sores, hematuria, menstrual problem, pelvic pain, urgency, vaginal bleeding, vaginal discharge and vaginal pain.  Musculoskeletal: Positive for arthralgias. Negative for back pain, joint swelling and myalgias.  Skin: Negative for rash.  Neurological: Negative for dizziness, syncope, light-headedness, numbness and headaches.  Hematological: Negative for adenopathy.  Psychiatric/Behavioral: Positive for agitation. Negative for confusion, sleep disturbance and suicidal ideas. The patient is not nervous/anxious.      Objective: BP 100/76   Ht 5\' 5"  (1.651 m)   Wt 203 lb (92.1 kg)   LMP  (LMP Unknown)   BMI 33.78 kg/m    Physical Exam Constitutional:      Appearance: She is well-developed.  Genitourinary:     Vulva, vagina, cervix, uterus, right adnexa and left adnexa normal.     No vulval lesion or tenderness noted.        No vaginal discharge, erythema or tenderness.     No cervical polyp.     Uterus is not enlarged or tender.     No right or left adnexal mass present.     Right adnexa not tender.     Left adnexa not tender.  Neck:     Thyroid: No thyromegaly.  Cardiovascular:     Rate and Rhythm: Normal  rate and regular rhythm.     Heart sounds: Normal heart sounds. No murmur.  Pulmonary:     Effort: Pulmonary effort is normal.     Breath sounds: Normal breath sounds.  Chest:     Breasts:        Right: No mass, nipple discharge, skin change or tenderness.        Left: No mass, nipple discharge, skin change or tenderness.  Abdominal:     Palpations: Abdomen is soft.     Tenderness: There is no abdominal tenderness. There is no  guarding.  Musculoskeletal:        General: Normal range of motion.     Cervical back: Normal range of motion.  Neurological:     General: No focal deficit present.     Mental Status: She is alert and oriented to person, place, and time.     Cranial Nerves: No cranial nerve deficit.  Skin:    General: Skin is warm and dry.  Psychiatric:        Mood and Affect: Mood normal.        Behavior: Behavior normal.        Thought Content: Thought content normal.        Judgment: Judgment normal.  Vitals reviewed.     Assessment/Plan:  Encounter for annual routine gynecological examination  Encounter for screening mammogram for malignant neoplasm of breast; pt has appt sched  Acute vaginitis - Fissure ext vagina. Rx lotrisone crm. Can do A&D oint/desitin as moisture barrier/keep dry. F/u prn.  - Plan: clotrimazole-betamethasone (LOTRISONE) cream          Meds ordered this encounter  Medications  . clotrimazole-betamethasone (LOTRISONE) cream    Sig: Apply externally BID prn sx up to 2 wks    Dispense:  15 g    Refill:  0    Order Specific Question:   Supervising Provider    Answer:   Nadara Mustard [[id number]]    GYN counsel mammography screening, menopause, adequate intake of calcium and vitamin D, diet and exercise    F/U  Return in about 1 year (around 11/27/2020).  Alicia B. Copland, PA-C 11/28/2019 4:03 PM

## 2019-11-28 NOTE — Patient Instructions (Signed)
I value your feedback and entrusting us with your care. If you get a [facility] patient survey, I would appreciate you taking the time to let us know about your experience today. Thank you!  As of August 10, 2019, your lab results will be released to your MyChart immediately, before I even have a chance to see them. Please give me time to review them and contact you if there are any abnormalities. Thank you for your patience.  

## 2019-12-01 ENCOUNTER — Ambulatory Visit
Admission: RE | Admit: 2019-12-01 | Discharge: 2019-12-01 | Disposition: A | Discharge status: Home / Self Care | Payer: BC Managed Care – PPO | Source: Ambulatory Visit | Attending: Unknown Physician Specialty | Admitting: Unknown Physician Specialty

## 2019-12-01 DIAGNOSIS — Z1231 Encounter for screening mammogram for malignant neoplasm of breast: Secondary | ICD-10-CM | POA: Insufficient documentation

## 2019-12-01 IMAGING — MG DIGITAL SCREENING BILAT W/ TOMO W/ CAD
8 series · 8 of 24 positions shown · non-contrast
Comparison: Previous exam(s).

CLINICAL DATA: Screening.

EXAM:
DIGITAL SCREENING BILATERAL MAMMOGRAM WITH TOMO AND CAD

[R MLO synth-2D]
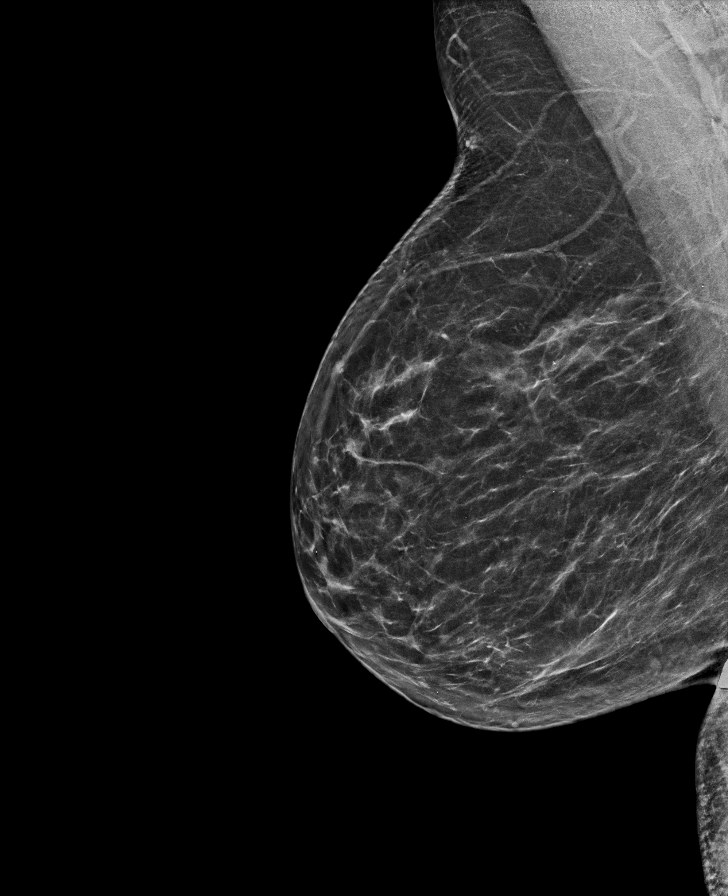

[R CC synth-2D]
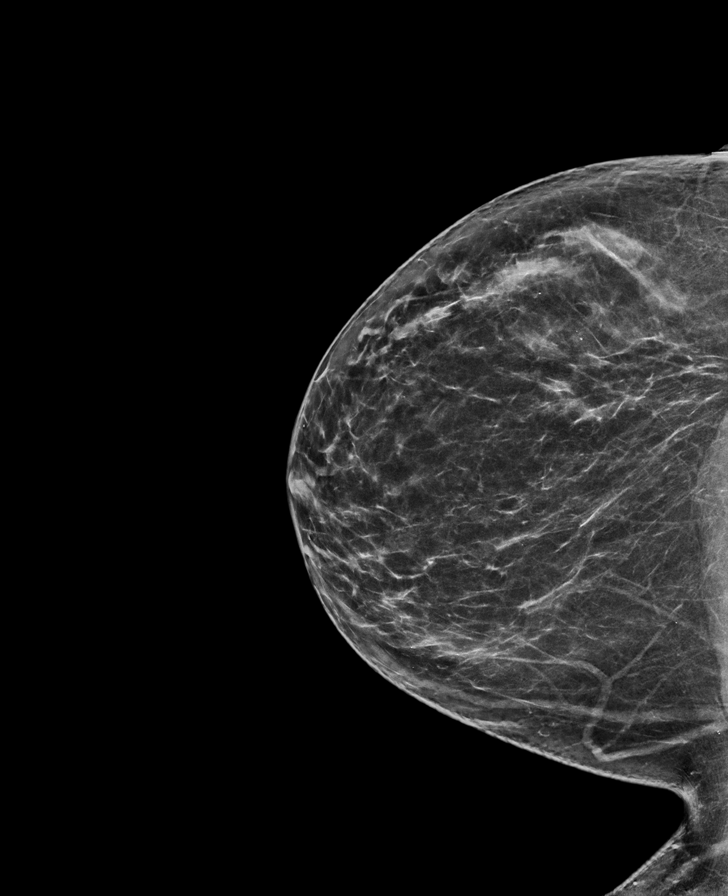

[L CC synth-2D]
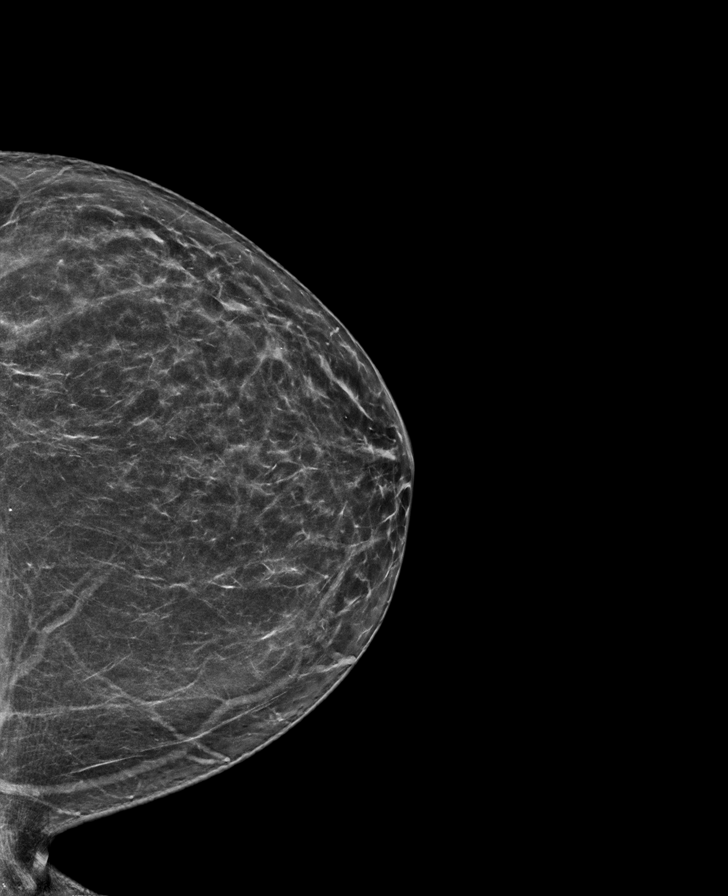

[L MLO synth-2D]
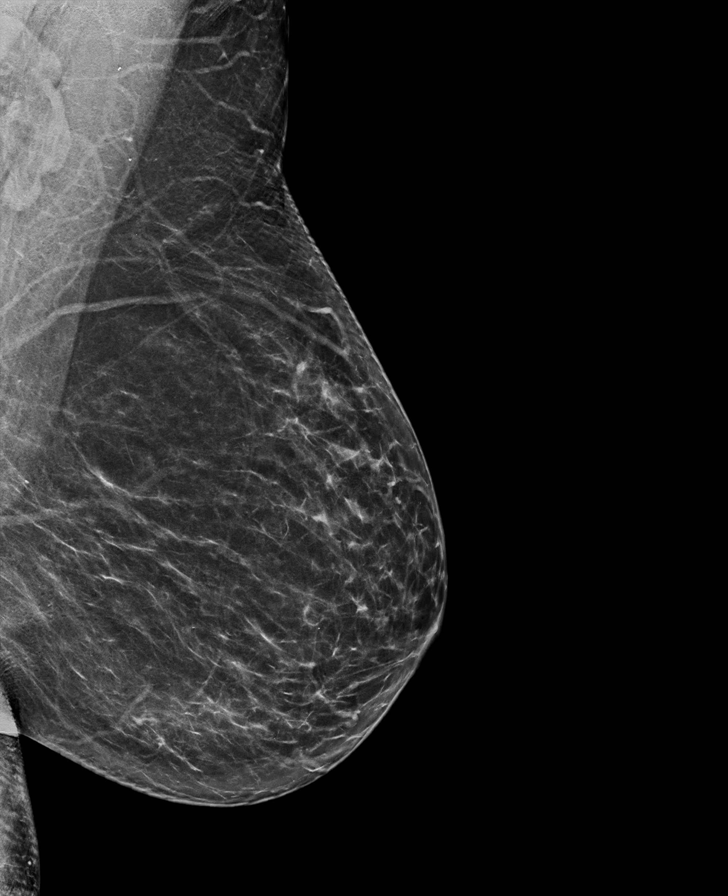

[L MLO tomo · tomo slice 39/76.0]
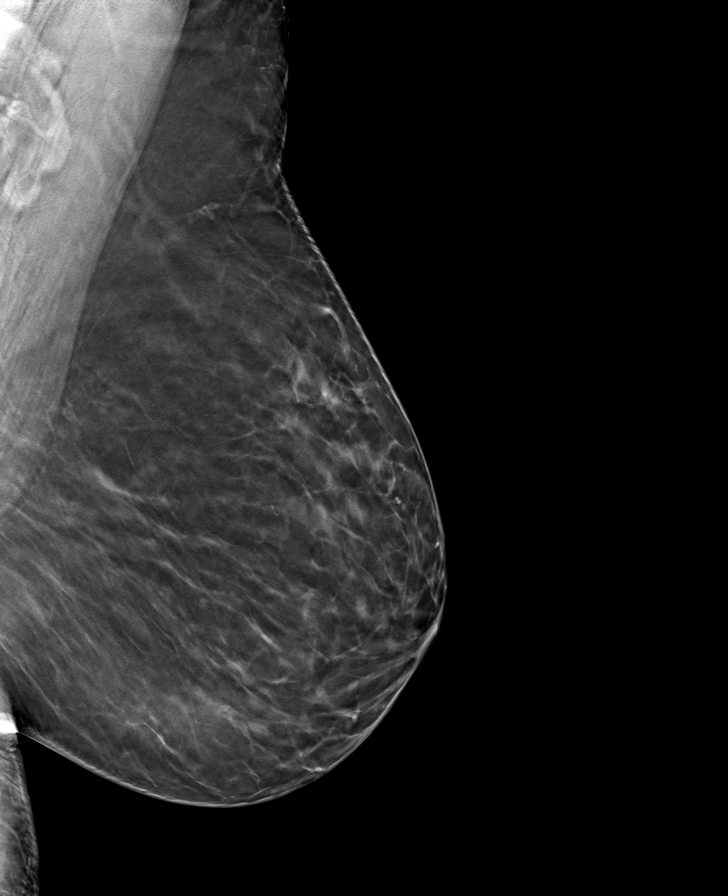

[R CC tomo · tomo slice 35/70.0]
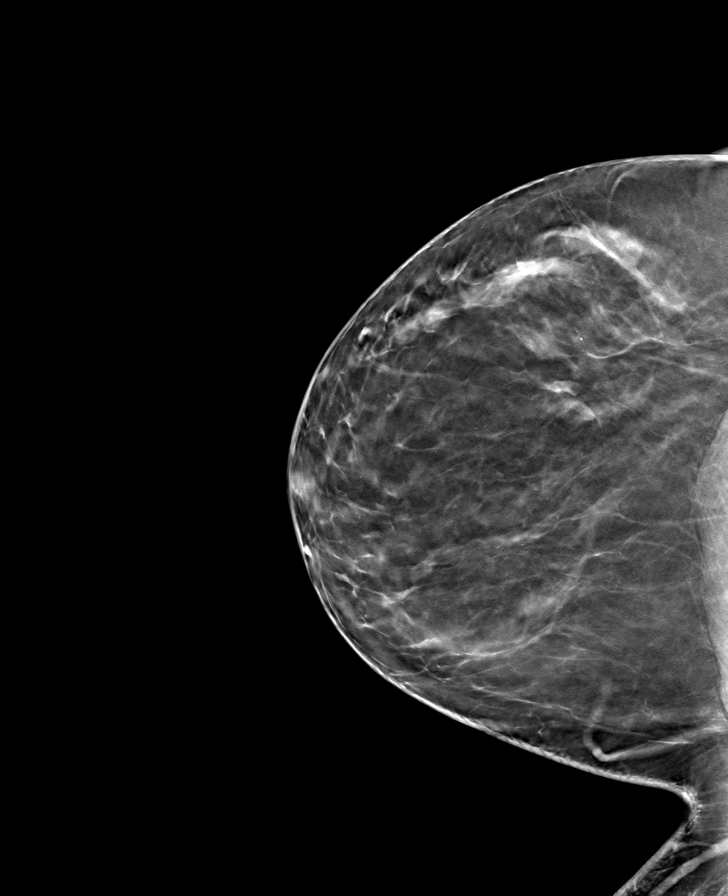

[R MLO tomo · tomo slice 35/69.0]
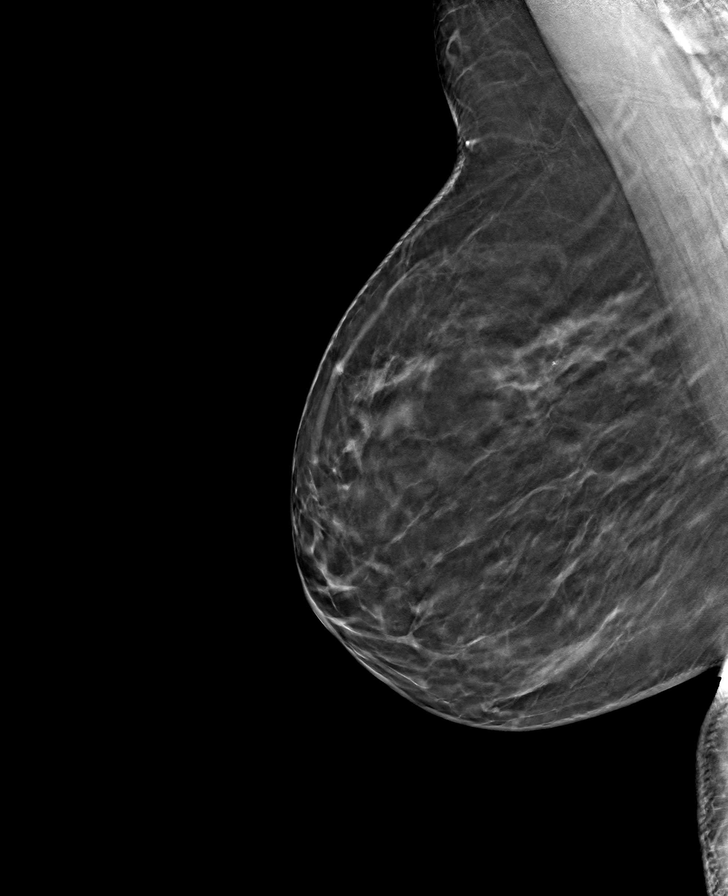

[L CC tomo · tomo slice 35/69.0]
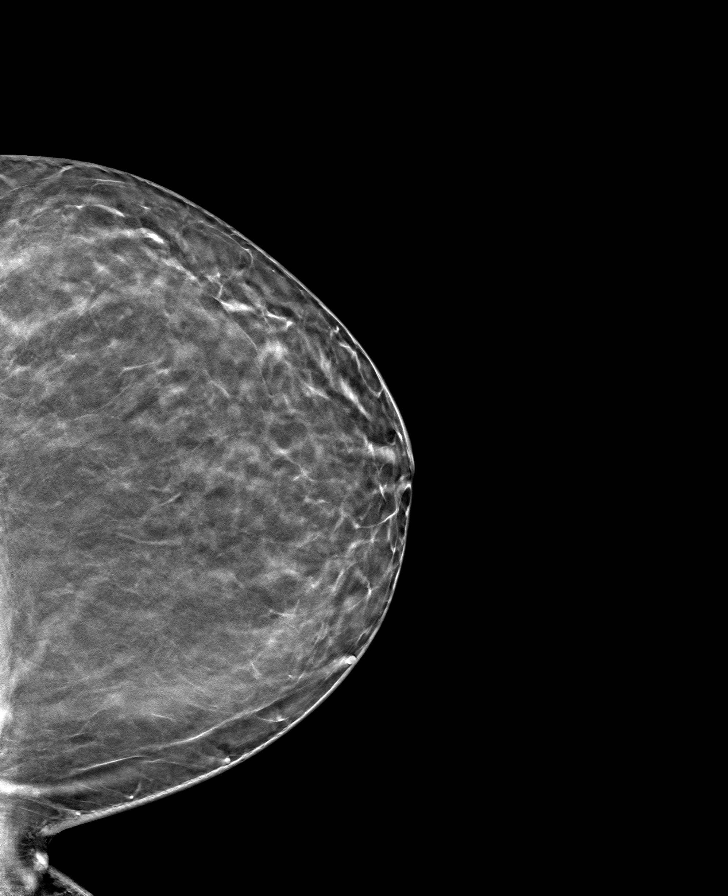

[8 of 24 positions shown; findings below may reference images not displayed]

ACR Breast Density Category b: There are scattered areas of
fibroglandular density.
FINDINGS: There are no findings suspicious for malignancy. Images were
processed with CAD.
IMPRESSION: No mammographic evidence of malignancy. A result letter of this
screening mammogram will be mailed directly to the patient.

RECOMMENDATION:
Screening mammogram in one year. (Code:[TQ])

BI-RADS CATEGORY  1: Negative.

## 2019-12-13 ENCOUNTER — Inpatient Hospital Stay
Admission: RE | Admit: 2019-12-13 | Discharge: 2019-12-13 | Disposition: A | Discharge status: Home / Self Care | Payer: Self-pay | Source: Ambulatory Visit | Attending: *Deleted | Admitting: *Deleted

## 2019-12-13 ENCOUNTER — Other Ambulatory Visit: Payer: Self-pay | Admitting: *Deleted

## 2019-12-13 DIAGNOSIS — Z1231 Encounter for screening mammogram for malignant neoplasm of breast: Secondary | ICD-10-CM

## 2019-12-26 DIAGNOSIS — D2261 Melanocytic nevi of right upper limb, including shoulder: Secondary | ICD-10-CM | POA: Diagnosis not present

## 2019-12-26 DIAGNOSIS — X32XXXA Exposure to sunlight, initial encounter: Secondary | ICD-10-CM | POA: Diagnosis not present

## 2019-12-26 DIAGNOSIS — D2262 Melanocytic nevi of left upper limb, including shoulder: Secondary | ICD-10-CM | POA: Diagnosis not present

## 2019-12-26 DIAGNOSIS — L570 Actinic keratosis: Secondary | ICD-10-CM | POA: Diagnosis not present

## 2019-12-26 DIAGNOSIS — D225 Melanocytic nevi of trunk: Secondary | ICD-10-CM | POA: Diagnosis not present

## 2019-12-26 DIAGNOSIS — D2272 Melanocytic nevi of left lower limb, including hip: Secondary | ICD-10-CM | POA: Diagnosis not present

## 2020-04-03 ENCOUNTER — Ambulatory Visit: Payer: BC Managed Care – PPO | Admitting: Nurse Practitioner

## 2020-04-10 ENCOUNTER — Encounter: Payer: Self-pay | Admitting: Nurse Practitioner

## 2020-04-10 ENCOUNTER — Ambulatory Visit: Payer: BC Managed Care – PPO | Admitting: Nurse Practitioner

## 2020-04-10 ENCOUNTER — Other Ambulatory Visit: Payer: Self-pay

## 2020-04-10 VITALS — BP 97/61 | HR 74 | Temp 98.0°F | Wt 204.8 lb

## 2020-04-10 DIAGNOSIS — I10 Essential (primary) hypertension: Secondary | ICD-10-CM | POA: Diagnosis not present

## 2020-04-10 DIAGNOSIS — E7800 Pure hypercholesterolemia, unspecified: Secondary | ICD-10-CM | POA: Diagnosis not present

## 2020-04-10 DIAGNOSIS — M25572 Pain in left ankle and joints of left foot: Secondary | ICD-10-CM

## 2020-04-10 DIAGNOSIS — E6609 Other obesity due to excess calories: Secondary | ICD-10-CM

## 2020-04-10 DIAGNOSIS — E669 Obesity, unspecified: Secondary | ICD-10-CM | POA: Insufficient documentation

## 2020-04-10 DIAGNOSIS — F418 Other specified anxiety disorders: Secondary | ICD-10-CM

## 2020-04-10 DIAGNOSIS — Z6834 Body mass index (BMI) 34.0-34.9, adult: Secondary | ICD-10-CM

## 2020-04-10 DIAGNOSIS — G8929 Other chronic pain: Secondary | ICD-10-CM

## 2020-04-10 NOTE — Patient Instructions (Signed)
Ankle Pain The ankle joint holds your body weight and allows you to move around. Ankle pain can occur on either side or the back of one ankle or both ankles. Ankle pain may be sharp and burning or dull and aching. There may be tenderness, stiffness, redness, or warmth around the ankle. Many things can cause ankle pain, including an injury to the area and overuse of the ankle. Follow these instructions at home: Activity  Rest your ankle as told by your health care provider. Avoid any activities that cause ankle pain.  Do not use the injured limb to support your body weight until your health care provider says that you can. Use crutches as told by your health care provider.  Do exercises as told by your health care provider.  Ask your health care provider when it is safe to drive if you have a brace on your ankle. If you have a brace:  Wear the brace as told by your health care provider. Remove it only as told by your health care provider.  Loosen the brace if your toes tingle, become numb, or turn cold and blue.  Keep the brace clean.  If the brace is not waterproof: ? Do not let it get wet. ? Cover it with a watertight covering when you take a bath or shower. If you were given an elastic bandage:   Remove it when you take a bath or a shower.  Try not to move your ankle very much, but wiggle your toes from time to time. This helps to prevent swelling.  Adjust the bandage to make it more comfortable if it feels too tight.  Loosen the bandage if you have numbness or tingling in your foot or if your foot turns cold and blue. Managing pain, stiffness, and swelling   If directed, put ice on the painful area. ? If you have a removable brace or elastic bandage, remove it as told by your health care provider. ? Put ice in a plastic bag. ? Place a towel between your skin and the bag. ? Leave the ice on for 20 minutes, 2-3 times a day.  Move your toes often to avoid stiffness and to  lessen swelling.  Raise (elevate) your ankle above the level of your heart while you are sitting or lying down. General instructions  Record information about your pain. Writing down the following may be helpful for you and your health care provider: ? How often you have ankle pain. ? Where the pain is located. ? What the pain feels like.  If treatment involves wearing a prescribed shoe or insole, make sure you wear it correctly and for as long as told by your health care provider.  Take over-the-counter and prescription medicines only as told by your health care provider.  Keep all follow-up visits as told by your health care provider. This is important. Contact a health care provider if:  Your pain gets worse.  Your pain is not relieved with medicines.  You have a fever or chills.  You are having more trouble with walking.  You have new symptoms. Get help right away if:  Your foot, leg, toes, or ankle: ? Tingles or becomes numb. ? Becomes swollen. ? Turns pale or blue. Summary  Ankle pain can occur on either side or the back of one ankle or both ankles.  Ankle pain may be sharp and burning or dull and aching.  Rest your ankle as told by your health care provider.   If told, apply ice to the area.  Take over-the-counter and prescription medicines only as told by your health care provider. This information is not intended to replace advice given to you by your health care provider. Make sure you discuss any questions you have with your health care provider. Document Revised: 12/06/2018 Document Reviewed: 02/23/2018 Elsevier Patient Education  2020 [employer]  

## 2020-04-10 NOTE — Assessment & Plan Note (Signed)
Chronic, stable with BP at goal. Continue Lisinopril 10 MG daily and adjust as needed.  Continue to monitor BP at home and document + focus on DASH diet.  BMP today. Will consider reduction to discontinuation of Lisinopril next visit if remains under good control.  Return in 6 months or sooner if any changes.

## 2020-04-10 NOTE — Assessment & Plan Note (Signed)
Improved with addition of Buspar as needed.  She has seen improvement with this.  GAD has improved from 13 to 2 now.  Continue current medication regimen and adjust as needed.  Has refills.  Return to office in 6 months.

## 2020-04-10 NOTE — Assessment & Plan Note (Signed)
Ongoing and intermittent for over a year since a sprain.  Suspect some OA present.  Does not wish to have imaging at this time.  Will consider in future if worsening or ongoing.  At this time continue to monitor and continue Tylenol and rest as needed at home + compression daily.  Recommend using ice as needed as well.

## 2020-04-10 NOTE — Assessment & Plan Note (Signed)
LDL 175 on past labs, but ASCVD 3.2%.  Discussed with patient and educated.  IF LDL >190 will start statin, at this time recheck lipid panel and continue diet focus.

## 2020-04-10 NOTE — Progress Notes (Signed)
BP 97/61 (BP Location: Left Arm, Cuff Size: Normal)   Pulse 74   Temp 98 F (36.7 C) (Oral)   Wt 204 lb 12.8 oz (92.9 kg)   LMP  (LMP Unknown)   SpO2 98%   BMI 34.08 kg/m    Subjective:    Patient ID: Stacie Bell, female    DOB: 1960/10/19, 59 y.o.   MRN: [id number]  HPI: Stacie Bell is a 59 y.o. female  Chief Complaint  Patient presents with  . Anxiety  . Hypertension   ANXIETY/STRESS Does endorse increased anxiety recently, no history of family members with depression or anxiety. Anxiety with getting state affairs in order + selling her house -- both parents who she cared for are passed now. Continues on Buspar which was started 09/06/2019, she takes one as needed. Duration:stable Anxious mood: sometimes, takes Buspar and it helps  Excessive worrying: no Irritability: no  Sweating: no Nausea: no Palpitations:no Hyperventilation: no Panic attacks: no Agoraphobia: no  Obscessions/compulsions: no Depressed mood: no Depression screen [facility] 2/9 04/10/2020 10/04/2019 09/06/2019 08/03/2019 04/04/2018  Decreased Interest 0 1 1 0 0  Down, Depressed, Hopeless 0 0 0 1 0  PHQ - 2 Score 0 1 1 1  0  Altered sleeping 1 1 2 1  0  Tired, decreased energy 3 1 2 1 1   Change in appetite 0 1 0 0 0  Feeling bad or failure about yourself  0 0 0 0 0  Trouble concentrating 0 1 1 1  0  Moving slowly or fidgety/restless 0 0 0 0 0  Suicidal thoughts 0 0 0 0 0  PHQ-9 Score 4 5 6 4 1   Difficult doing work/chores Not difficult at all Not difficult at all Not difficult at all - Not difficult at all  Anhedonia: no Weight changes: no Insomnia: occasional Hypersomnia: no Fatigue/loss of energy: sometimes Feelings of worthlessness: no Feelings of guilt: no Impaired concentration/indecisiveness: no Suicidal ideations: no  Crying spells: no Recent Stressors/Life Changes: yes   Relationship problems: no   Family stress: yes     Financial stress: no    Job stress: no    Recent death/loss:  no GAD 7 : Generalized Anxiety Score 04/10/2020 10/04/2019 09/06/2019 08/03/2019  Nervous, Anxious, on Edge 0 1 3 1   Control/stop worrying 1 2 3  0  Worry too much - different things 0 1 3 1   Trouble relaxing 0 1 3 1   Restless 0 0 1 0  Easily annoyed or irritable 1 0 0 1  Afraid - awful might happen 0 0 0 0  Total GAD 7 Score 2 5 13 4   Anxiety Difficulty Not difficult at all Not difficult at all Somewhat difficult Somewhat difficult   HYPERTENSION Continues on Lisinopril 10 MG daily and ASA. Satisfied with current treatment?yes Duration of hypertension:chronic BP monitoring frequency:a few times a week BP range:130/90's in morning before medication BP medication side effects:no Medication compliance:good compliance Previous BP meds:Lisinopril Aspirin:yes Recurrent headaches:no Visual changes:no Palpitations:no Dyspnea:no Chest pain: no Lower extremity edema:no Dizzy/lightheaded:no The 10-year ASCVD risk score DC Jr., et al., 2013) is: 3.2%   Values used to calculate the score:     Age: 22 years     Sex: Female     Is Non-Hispanic African American: No     Diabetic: No     Tobacco smoker: No     Systolic Blood Pressure: 97 mmHg     Is BP treated: Yes     HDL Cholesterol:  43 mg/dL     Total Cholesterol: 258 mg/dL  ANKLE PAIN Had a fall over a year ago and left ankle twisted, was on phone getting book ready to listen to, got caught on wiry grass -- did not hurt anything else and kept walking to finish her 3 miles.  Since this time ankle swells and hurts every so often. Never had any imaging of ankle.  Is on feet 8 hours a day at baseline.   Duration: chronic Involved ankle: left Mechanism of injury: trauma Location: left outer ankle Onset: gradual  Severity: mild  Quality:  dull and aching Frequency: intermittent Radiation: no Aggravating factors: heat and long periods on feet Alleviating factors: Tylenol and rest -- compression hose Status:  fluctuating Treatments attempted: rest and APAP  Relief with NSAIDs?:  No NSAIDs Taken Weakness with weight bearing or walking: no Morning stiffness: no Swelling: occasional Redness: no Bruising: no Paresthesias / decreased sensation: no  Fevers:no  Relevant past medical, surgical, family and social history reviewed and updated as indicated. Interim medical history since our last visit reviewed. Allergies and medications reviewed and updated.  Review of Systems  Constitutional: Negative for activity change, appetite change, diaphoresis, fatigue and fever.  Respiratory: Negative for cough, chest tightness, shortness of breath and wheezing.   Cardiovascular: Negative for chest pain, palpitations and leg swelling.  Gastrointestinal: Negative.   Endocrine: Negative.   Musculoskeletal: Positive for arthralgias.  Neurological: Negative.   Psychiatric/Behavioral: Negative for decreased concentration, self-injury, sleep disturbance and suicidal ideas. The patient is not nervous/anxious.     Per HPI unless specifically indicated above     Objective:    BP 97/61 (BP Location: Left Arm, Cuff Size: Normal)   Pulse 74   Temp 98 F (36.7 C) (Oral)   Wt 204 lb 12.8 oz (92.9 kg)   LMP  (LMP Unknown)   SpO2 98%   BMI 34.08 kg/m   Wt Readings from Last 3 Encounters:  04/10/20 204 lb 12.8 oz (92.9 kg)  11/28/19 203 lb (92.1 kg)  08/11/19 200 lb (90.7 kg)    Physical Exam Vitals and nursing note reviewed.  Constitutional:      General: She is awake. She is not in acute distress.    Appearance: She is well-developed and well-groomed. She is obese. She is not ill-appearing.  HENT:     Head: Normocephalic.     Right Ear: Hearing normal.     Left Ear: Hearing normal.  Eyes:     General: Lids are normal.        Right eye: No discharge.        Left eye: No discharge.     Conjunctiva/sclera: Conjunctivae normal.     Pupils: Pupils are equal, round, and reactive to light.  Neck:      Thyroid: No thyromegaly.     Vascular: No carotid bruit.  Cardiovascular:     Rate and Rhythm: Normal rate and regular rhythm.     Heart sounds: Normal heart sounds. No murmur heard.  No gallop.   Pulmonary:     Effort: Pulmonary effort is normal. No accessory muscle usage or respiratory distress.     Breath sounds: Normal breath sounds.  Abdominal:     General: Bowel sounds are normal.     Palpations: Abdomen is soft.  Musculoskeletal:     Cervical back: Normal range of motion and neck supple.     Right lower leg: No edema.     Left lower leg:  No edema.     Right ankle: Normal.     Left ankle: No swelling, ecchymosis or lacerations. Tenderness present over the posterior TF ligament. Normal range of motion. Anterior drawer test negative. Normal pulse.     Comments: No bony tenderness.  Skin:    General: Skin is warm and dry.  Neurological:     Mental Status: She is alert and oriented to person, place, and time.  Psychiatric:        Attention and Perception: Attention normal.        Mood and Affect: Mood normal.        Speech: Speech normal.        Behavior: Behavior normal. Behavior is cooperative.        Thought Content: Thought content normal.     Results for orders placed or performed in visit on 04/10/20  HM COLONOSCOPY  Result Value Ref Range   HM Colonoscopy See Report (in chart) See Report (in chart), Patient Reported      Assessment & Plan:   Problem List Items Addressed This Visit      Cardiovascular and Mediastinum   Hypertension - Primary    Chronic, stable with BP at goal. Continue Lisinopril 10 MG daily and adjust as needed.  Continue to monitor BP at home and document + focus on DASH diet.  BMP today. Will consider reduction to discontinuation of Lisinopril next visit if remains under good control.  Return in 6 months or sooner if any changes.      Relevant Orders   Basic metabolic panel     Other   Situational anxiety    Improved with addition of  Buspar as needed.  She has seen improvement with this.  GAD has improved from 13 to 2 now.  Continue current medication regimen and adjust as needed.  Has refills.  Return to office in 6 months.      Elevated LDL cholesterol level    LDL 175 on past labs, but ASCVD 3.2%.  Discussed with patient and educated.  IF LDL >190 will start statin, at this time recheck lipid panel and continue diet focus.      Relevant Orders   Lipid Panel w/o Chol/HDL Ratio   Ankle pain, left    Ongoing and intermittent for over a year since a sprain.  Suspect some OA present.  Does not wish to have imaging at this time.  Will consider in future if worsening or ongoing.  At this time continue to monitor and continue Tylenol and rest as needed at home + compression daily.  Recommend using ice as needed as well.        Obesity    Recommended eating smaller high protein, low fat meals more frequently and exercising 30 mins a day 5 times a week with a goal of 10-15lb weight loss in the next 3 months. Patient voiced their understanding and motivation to adhere to these recommendations.           Follow up plan: Return in about 6 months (around 10/11/2020) for HTN/HLD, MOOD, IFG.

## 2020-04-10 NOTE — Assessment & Plan Note (Signed)
Recommended eating smaller high protein, low fat meals more frequently and exercising 30 mins a day 5 times a week with a goal of 10-15lb weight loss in the next 3 months. Patient voiced their understanding and motivation to adhere to these recommendations.  

## 2020-04-11 DIAGNOSIS — H04123 Dry eye syndrome of bilateral lacrimal glands: Secondary | ICD-10-CM | POA: Diagnosis not present

## 2020-04-11 LAB — LIPID PANEL W/O CHOL/HDL RATIO
Cholesterol, Total: 252 mg/dL — ABNORMAL HIGH (ref 100–199)
HDL: 47 mg/dL (ref >39)
LDL Chol Calc (NIH): 173 mg/dL — ABNORMAL HIGH (ref 0–99)
Triglycerides: 172 mg/dL — ABNORMAL HIGH (ref 0–149)
VLDL Cholesterol Cal: 32 mg/dL (ref 5–40)

## 2020-04-11 LAB — BASIC METABOLIC PANEL
BUN/Creatinine Ratio: 22 (ref 9–23)
BUN: 15 mg/dL (ref 6–24)
CO2: 25 mmol/L (ref 20–29)
Calcium: 9.4 mg/dL (ref 8.7–10.2)
Chloride: 104 mmol/L (ref 96–106)
Creatinine, Ser: 0.69 mg/dL (ref 0.57–1.00)
GFR calc Af Amer: 111 mL/min/{1.73_m2} (ref >59)
GFR calc non Af Amer: 96 mL/min/{1.73_m2} (ref >59)
Glucose: 86 mg/dL (ref 65–99)
Potassium: 4.2 mmol/L (ref 3.5–5.2)
Sodium: 141 mmol/L (ref 134–144)

## 2020-04-11 NOTE — Progress Notes (Signed)
Good morning, please let Stacie Bell know her labs have returned and kidney function remains stable as are electrolytes.  Cholesterol levels remain elevated with LDL 173 -- her risk score is only 2.9% for cardiac event in next 10 years though.  If LDL increases to 190 or greater OR risk score increases we will discuss starting medication.  For now recommend heavy focus on diet and regular activity.  If any questions let me know.  Have a great day!!

## 2020-05-24 DIAGNOSIS — Z20822 Contact with and (suspected) exposure to covid-19: Secondary | ICD-10-CM | POA: Diagnosis not present

## 2020-06-03 ENCOUNTER — Other Ambulatory Visit: Payer: Self-pay | Admitting: Nurse Practitioner

## 2020-06-04 ENCOUNTER — Other Ambulatory Visit: Payer: Self-pay | Admitting: Nurse Practitioner

## 2020-06-04 ENCOUNTER — Telehealth: Payer: Self-pay | Admitting: Nurse Practitioner

## 2020-06-04 MED ORDER — PANTOPRAZOLE SODIUM 40 MG PO TBEC: 40.0000 mg | DELAYED_RELEASE_TABLET | Freq: Every day | ORAL | 5 refills | Status: DC

## 2020-06-04 NOTE — Telephone Encounter (Signed)
Refill sent in

## 2020-06-04 NOTE — Telephone Encounter (Signed)
Pt requesting a refill on pantoprazole was dc'd 04/10/20 "pt preference" Forwarding to office ([facility])

## 2020-06-04 NOTE — Telephone Encounter (Signed)
Medication Refill - Medication: pantoprazole (PROTONIX) 40 MG tablet   Has the patient contacted their pharmacy? Yes.   (Agent: If no, request that the patient contact the pharmacy for the refill.) (Agent: If yes, when and what did the pharmacy advise?)  Told to call PCP  Preferred Pharmacy (with phone number or street name): [id number] - [place], [place] - [address]  [address] [place] [address]  Phone:  [phone] Fax:  [phone]   Agent: Please be advised that RX refills may take up to 3 business days. We ask that you follow-up with your pharmacy.

## 2020-06-04 NOTE — Telephone Encounter (Signed)
Called pt let her know that her her medication was sent to her pharmacy. Pt verbalized understanding.  KP

## 2020-06-04 NOTE — Telephone Encounter (Signed)
Please Advise refill. Pantoprazole is not on current medication list.  KP

## 2020-06-11 ENCOUNTER — Other Ambulatory Visit: Payer: Self-pay

## 2020-06-11 ENCOUNTER — Emergency Department: Payer: BC Managed Care – PPO

## 2020-06-11 ENCOUNTER — Emergency Department
Admission: EM | Admit: 2020-06-11 | Discharge: 2020-06-11 | Disposition: A | Discharge status: Home / Self Care | Payer: BC Managed Care – PPO | Attending: Emergency Medicine | Admitting: Emergency Medicine

## 2020-06-11 DIAGNOSIS — Y9389 Activity, other specified: Secondary | ICD-10-CM | POA: Diagnosis not present

## 2020-06-11 DIAGNOSIS — S0990XA Unspecified injury of head, initial encounter: Secondary | ICD-10-CM | POA: Diagnosis not present

## 2020-06-11 DIAGNOSIS — S5002XA Contusion of left elbow, initial encounter: Secondary | ICD-10-CM | POA: Diagnosis not present

## 2020-06-11 DIAGNOSIS — T07XXXA Unspecified multiple injuries, initial encounter: Secondary | ICD-10-CM

## 2020-06-11 DIAGNOSIS — S40212A Abrasion of left shoulder, initial encounter: Secondary | ICD-10-CM | POA: Insufficient documentation

## 2020-06-11 DIAGNOSIS — M7918 Myalgia, other site: Secondary | ICD-10-CM | POA: Diagnosis not present

## 2020-06-11 DIAGNOSIS — S40012A Contusion of left shoulder, initial encounter: Secondary | ICD-10-CM | POA: Diagnosis not present

## 2020-06-11 DIAGNOSIS — M4312 Spondylolisthesis, cervical region: Secondary | ICD-10-CM | POA: Diagnosis not present

## 2020-06-11 DIAGNOSIS — D689 Coagulation defect, unspecified: Secondary | ICD-10-CM | POA: Diagnosis not present

## 2020-06-11 DIAGNOSIS — I10 Essential (primary) hypertension: Secondary | ICD-10-CM | POA: Diagnosis not present

## 2020-06-11 DIAGNOSIS — S6992XA Unspecified injury of left wrist, hand and finger(s), initial encounter: Secondary | ICD-10-CM | POA: Diagnosis not present

## 2020-06-11 DIAGNOSIS — Z23 Encounter for immunization: Secondary | ICD-10-CM | POA: Insufficient documentation

## 2020-06-11 DIAGNOSIS — M47812 Spondylosis without myelopathy or radiculopathy, cervical region: Secondary | ICD-10-CM | POA: Diagnosis not present

## 2020-06-11 DIAGNOSIS — M25512 Pain in left shoulder: Secondary | ICD-10-CM | POA: Diagnosis not present

## 2020-06-11 DIAGNOSIS — Y92410 Unspecified street and highway as the place of occurrence of the external cause: Secondary | ICD-10-CM | POA: Diagnosis not present

## 2020-06-11 DIAGNOSIS — S20212A Contusion of left front wall of thorax, initial encounter: Secondary | ICD-10-CM | POA: Insufficient documentation

## 2020-06-11 DIAGNOSIS — J3489 Other specified disorders of nose and nasal sinuses: Secondary | ICD-10-CM | POA: Diagnosis not present

## 2020-06-11 DIAGNOSIS — S199XXA Unspecified injury of neck, initial encounter: Secondary | ICD-10-CM | POA: Diagnosis not present

## 2020-06-11 DIAGNOSIS — M25522 Pain in left elbow: Secondary | ICD-10-CM | POA: Diagnosis not present

## 2020-06-11 DIAGNOSIS — Z79899 Other long term (current) drug therapy: Secondary | ICD-10-CM | POA: Diagnosis not present

## 2020-06-11 DIAGNOSIS — J322 Chronic ethmoidal sinusitis: Secondary | ICD-10-CM | POA: Diagnosis not present

## 2020-06-11 DIAGNOSIS — R0789 Other chest pain: Secondary | ICD-10-CM | POA: Diagnosis not present
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
## 2020-06-11 MED ORDER — BACLOFEN 10 MG PO TABS: 10.0000 mg | ORAL_TABLET | Freq: Every day | ORAL | 1 refills | Status: DC

## 2020-06-11 MED ORDER — HYDROCODONE-ACETAMINOPHEN 5-325 MG PO TABS
1.0000 | ORAL_TABLET | Freq: Once | ORAL | Status: AC
  Administered 2020-06-11: 1 via ORAL
  Filled 2020-06-11: qty 1

## 2020-06-11 MED ORDER — HYDROCODONE-ACETAMINOPHEN 5-325 MG PO TABS: 1.0000 | ORAL_TABLET | Freq: Four times a day (QID) | ORAL | 0 refills | Status: DC | PRN

## 2020-06-11 MED ORDER — MELOXICAM 15 MG PO TABS: 15.0000 mg | ORAL_TABLET | Freq: Every day | ORAL | 2 refills | Status: DC

## 2020-06-11 MED ORDER — TETANUS-DIPHTH-ACELL PERTUSSIS 5-2.5-18.5 LF-MCG/0.5 IM SUSP
0.5000 mL | Freq: Once | INTRAMUSCULAR | Status: AC
  Administered 2020-06-11: 0.5 mL via INTRAMUSCULAR
  Filled 2020-06-11: qty 0.5

## 2020-06-11 NOTE — ED Notes (Signed)
Patient transported to X-ray 

## 2020-06-11 NOTE — Discharge Instructions (Signed)
Follow-up with your regular doctor if not improving in 5 to 7 days.  Return emergency department worsening.  Follow-up with orthopedics if you continue to have musculoskeletal pain.  Increasing abdominal pain and increasing headache/nausea/vomiting or reasons to return to the emergency department immediately.
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
## 2020-06-11 NOTE — ED Notes (Signed)
Dressing to left elbow area.  Pt alert
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
## 2020-06-17 ENCOUNTER — Telehealth: Payer: Self-pay

## 2020-06-17 DIAGNOSIS — M545 Low back pain, unspecified: Secondary | ICD-10-CM | POA: Diagnosis not present

## 2020-06-17 DIAGNOSIS — M79602 Pain in left arm: Secondary | ICD-10-CM | POA: Diagnosis not present

## 2020-06-17 NOTE — Telephone Encounter (Signed)
Called pt to offer cancellation appt, pt states that she is at [facility] now
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
## 2020-06-17 NOTE — Telephone Encounter (Signed)
Called pt, she got a note from er excusing her until today. Advised no openings advised uc or see if er can extend until she is scheduled. Placed her on cancellation list. Please advise.    Copied from CRM [id number]. Topic: General - Other >> Jun 17, 2020  8:09 AM Gwenlyn Fudge wrote: Reason for CRM: Pt called stating that she went back to work today, but that she was not able to complete her job duties. She is requesting to have an extended work note. Pt states that the motions are causing pain in her arm and rib cage. Please advise.

## 2020-06-17 NOTE — Telephone Encounter (Signed)
Noted  

## 2020-06-24 ENCOUNTER — Telehealth: Payer: Self-pay

## 2020-06-24 ENCOUNTER — Encounter: Payer: Self-pay | Admitting: Nurse Practitioner

## 2020-06-24 DIAGNOSIS — S29012A Strain of muscle and tendon of back wall of thorax, initial encounter: Secondary | ICD-10-CM | POA: Diagnosis not present

## 2020-06-24 DIAGNOSIS — M47812 Spondylosis without myelopathy or radiculopathy, cervical region: Secondary | ICD-10-CM | POA: Diagnosis not present

## 2020-06-24 DIAGNOSIS — M546 Pain in thoracic spine: Secondary | ICD-10-CM | POA: Diagnosis not present

## 2020-06-24 DIAGNOSIS — M7552 Bursitis of left shoulder: Secondary | ICD-10-CM | POA: Diagnosis not present

## 2020-06-24 NOTE — Telephone Encounter (Signed)
Copied from CRM [id number]. Topic: Appointment Scheduling - Scheduling Inquiry for Clinic >> Jun 24, 2020  8:03 AM Stacie Bell wrote: Reason for CRM: pt is calling and would like er fup appt before 07-03-2020. Pt arm and back is still hurting and she would like to know if she can get a work note. Pt is unable to perform her job. Pt did go to urgent care and got a work note for one week. Pt would like to know if provider will give her a work note until her appt. Please advise

## 2020-06-24 NOTE — Telephone Encounter (Signed)
I have printed out letter to cover until she sees [place] in office, if any provider in office able to see her before 07/03/20 please add on.  She does not have MyChart, so will need to come and pick-up letter in office.  Thank you.

## 2020-06-24 NOTE — Telephone Encounter (Signed)
Called pt she is going to come in and get letter. Pt states that is going to drop off paperwork to see if we can fill out as well

## 2020-07-03 ENCOUNTER — Encounter: Payer: Self-pay | Admitting: Nurse Practitioner

## 2020-07-03 ENCOUNTER — Ambulatory Visit: Payer: BC Managed Care – PPO | Admitting: Nurse Practitioner

## 2020-07-03 ENCOUNTER — Other Ambulatory Visit: Payer: Self-pay

## 2020-07-03 VITALS — BP 99/67 | HR 72 | Temp 98.0°F | Ht 65.0 in | Wt 202.2 lb

## 2020-07-03 DIAGNOSIS — M546 Pain in thoracic spine: Secondary | ICD-10-CM

## 2020-07-03 DIAGNOSIS — M549 Dorsalgia, unspecified: Secondary | ICD-10-CM | POA: Insufficient documentation
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
## 2020-07-03 NOTE — Assessment & Plan Note (Signed)
Took placed on 06/11/20.  Will continue collaboration with ortho for ongoing discomfort and physical therapy.  Return for worsening or ongoing pain.  FMLA and short term disability papers to be filled out and sent.
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
## 2020-07-05 ENCOUNTER — Telehealth: Payer: Self-pay

## 2020-07-05 NOTE — Telephone Encounter (Signed)
Paperwork for [employer] and Disability faxed back. Called and notified patient of this as well.

## 2020-07-09 DIAGNOSIS — S239XXS Sprain of unspecified parts of thorax, sequela: Secondary | ICD-10-CM | POA: Diagnosis not present

## 2020-07-09 DIAGNOSIS — M6281 Muscle weakness (generalized): Secondary | ICD-10-CM | POA: Diagnosis not present

## 2020-07-31 DIAGNOSIS — S29012A Strain of muscle and tendon of back wall of thorax, initial encounter: Secondary | ICD-10-CM | POA: Diagnosis not present

## 2020-08-11 ENCOUNTER — Other Ambulatory Visit: Payer: Self-pay | Admitting: Nurse Practitioner

## 2020-08-11 NOTE — Telephone Encounter (Signed)
Requested Prescriptions  Pending Prescriptions Disp Refills  . lisinopril (ZESTRIL) 10 MG tablet [Pharmacy Med Name: LISINOPRIL 10 MG TABLET] 90 tablet 0    Sig: TAKE 1 TABLET BY MOUTH EVERY DAY     Cardiovascular:  ACE Inhibitors Passed - 08/11/2020  4:15 PM      Passed - Cr in normal range and within 180 days    Creatinine, Ser  Date Value Ref Range Status  04/10/2020 0.69 0.57 - 1.00 mg/dL Final         Passed - K in normal range and within 180 days    Potassium  Date Value Ref Range Status  04/10/2020 4.2 3.5 - 5.2 mmol/L Final         Passed - Patient is not pregnant      Passed - Last BP in normal range    BP Readings from Last 1 Encounters:  07/03/20 99/67         Passed - Valid encounter within last 6 months    Recent Outpatient Visits          1 month ago Acute bilateral thoracic back pain   [facility] [place], Dorie Rank, NP   4 months ago Essential hypertension   [facility] [place], Dorie Rank, NP   10 months ago Essential hypertension   [facility], Dorie Rank, NP   11 months ago Essential hypertension   [facility] [place], Anderson T, NP   1 year ago Elevated blood pressure reading   [facility], Salley Hews, PA-C      Future Appointments            In 2 months [place], Dorie Rank, NP [facility], [facility]

## 2020-09-04 DIAGNOSIS — M546 Pain in thoracic spine: Secondary | ICD-10-CM | POA: Diagnosis not present

## 2020-09-04 DIAGNOSIS — S29012A Strain of muscle and tendon of back wall of thorax, initial encounter: Secondary | ICD-10-CM | POA: Diagnosis not present

## 2020-09-04 DIAGNOSIS — M47812 Spondylosis without myelopathy or radiculopathy, cervical region: Secondary | ICD-10-CM | POA: Diagnosis not present

## 2020-09-05 ENCOUNTER — Other Ambulatory Visit: Payer: Self-pay | Admitting: Orthopedic Surgery

## 2020-09-05 DIAGNOSIS — M546 Pain in thoracic spine: Secondary | ICD-10-CM

## 2020-09-05 DIAGNOSIS — S29012A Strain of muscle and tendon of back wall of thorax, initial encounter: Secondary | ICD-10-CM

## 2020-09-09 DIAGNOSIS — M7732 Calcaneal spur, left foot: Secondary | ICD-10-CM | POA: Diagnosis not present

## 2020-09-09 DIAGNOSIS — S99922A Unspecified injury of left foot, initial encounter: Secondary | ICD-10-CM | POA: Diagnosis not present

## 2020-09-09 DIAGNOSIS — S92015A Nondisplaced fracture of body of left calcaneus, initial encounter for closed fracture: Secondary | ICD-10-CM | POA: Diagnosis not present

## 2020-09-25 ENCOUNTER — Other Ambulatory Visit: Payer: Self-pay

## 2020-09-25 ENCOUNTER — Ambulatory Visit
Admission: RE | Admit: 2020-09-25 | Discharge: 2020-09-25 | Disposition: A | Discharge status: Home / Self Care | Payer: BC Managed Care – PPO | Source: Ambulatory Visit | Attending: Orthopedic Surgery | Admitting: Orthopedic Surgery

## 2020-09-25 DIAGNOSIS — S29012A Strain of muscle and tendon of back wall of thorax, initial encounter: Secondary | ICD-10-CM | POA: Insufficient documentation

## 2020-09-25 DIAGNOSIS — M5124 Other intervertebral disc displacement, thoracic region: Secondary | ICD-10-CM | POA: Diagnosis not present

## 2020-09-25 DIAGNOSIS — M546 Pain in thoracic spine: Secondary | ICD-10-CM | POA: Insufficient documentation

## 2020-09-25 DIAGNOSIS — D1809 Hemangioma of other sites: Secondary | ICD-10-CM | POA: Diagnosis not present

## 2020-09-25 DIAGNOSIS — M47814 Spondylosis without myelopathy or radiculopathy, thoracic region: Secondary | ICD-10-CM | POA: Diagnosis not present
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
## 2020-09-27 DIAGNOSIS — M79672 Pain in left foot: Secondary | ICD-10-CM | POA: Diagnosis not present

## 2020-09-27 DIAGNOSIS — M722 Plantar fascial fibromatosis: Secondary | ICD-10-CM | POA: Diagnosis not present

## 2020-09-27 DIAGNOSIS — S92035A Nondisplaced avulsion fracture of tuberosity of left calcaneus, initial encounter for closed fracture: Secondary | ICD-10-CM | POA: Diagnosis not present

## 2020-09-27 DIAGNOSIS — M25475 Effusion, left foot: Secondary | ICD-10-CM | POA: Diagnosis not present

## 2020-10-07 ENCOUNTER — Other Ambulatory Visit: Payer: Self-pay | Admitting: Nurse Practitioner

## 2020-10-12 ENCOUNTER — Encounter: Payer: Self-pay | Admitting: Nurse Practitioner

## 2020-10-14 ENCOUNTER — Ambulatory Visit: Payer: BC Managed Care – PPO | Admitting: Nurse Practitioner

## 2020-10-14 ENCOUNTER — Other Ambulatory Visit: Payer: Self-pay | Admitting: Nurse Practitioner

## 2020-10-14 ENCOUNTER — Encounter: Payer: Self-pay | Admitting: Nurse Practitioner

## 2020-10-14 ENCOUNTER — Other Ambulatory Visit: Payer: Self-pay

## 2020-10-14 VITALS — BP 120/84 | HR 73 | Temp 97.6°F | Ht 63.23 in | Wt 213.2 lb

## 2020-10-14 DIAGNOSIS — F418 Other specified anxiety disorders: Secondary | ICD-10-CM

## 2020-10-14 DIAGNOSIS — I10 Essential (primary) hypertension: Secondary | ICD-10-CM | POA: Diagnosis not present

## 2020-10-14 DIAGNOSIS — Z6837 Body mass index (BMI) 37.0-37.9, adult: Secondary | ICD-10-CM

## 2020-10-14 DIAGNOSIS — E7800 Pure hypercholesterolemia, unspecified: Secondary | ICD-10-CM | POA: Diagnosis not present

## 2020-10-14 DIAGNOSIS — R7301 Impaired fasting glucose: Secondary | ICD-10-CM | POA: Diagnosis not present

## 2020-10-14 DIAGNOSIS — Z114 Encounter for screening for human immunodeficiency virus [HIV]: Secondary | ICD-10-CM | POA: Diagnosis not present

## 2020-10-14 DIAGNOSIS — E66812 Obesity, class 2: Secondary | ICD-10-CM

## 2020-10-14 DIAGNOSIS — Z1159 Encounter for screening for other viral diseases: Secondary | ICD-10-CM | POA: Diagnosis not present

## 2020-10-14 DIAGNOSIS — Z1231 Encounter for screening mammogram for malignant neoplasm of breast: Secondary | ICD-10-CM

## 2020-10-14 DIAGNOSIS — E6609 Other obesity due to excess calories: Secondary | ICD-10-CM

## 2020-10-14 LAB — BAYER DCA HB A1C WAIVED: HB A1C (BAYER DCA - WAIVED): 5.2 % (ref <7.0)

## 2020-10-14 MED ORDER — LISINOPRIL 10 MG PO TABS: 10.0000 mg | ORAL_TABLET | Freq: Every day | ORAL | 4 refills | Status: DC

## 2020-10-14 MED ORDER — BUSPIRONE HCL 5 MG PO TABS
5.0000 mg | ORAL_TABLET | Freq: Two times a day (BID) | ORAL | 4 refills | Status: DC
Start: 2020-10-14 — End: 2021-04-14

## 2020-10-14 NOTE — Addendum Note (Signed)
Addended by: Aura Dials T on: 10/14/2020 03:01 PM   Modules accepted: Orders

## 2020-10-14 NOTE — Patient Instructions (Signed)

## 2020-10-14 NOTE — Assessment & Plan Note (Signed)
LDL 173 on past labs, but ASCVD 4.8%.  Discussed with patient and educated.  IF LDL >190 will start statin, at this time recheck lipid panel and continue diet focus.

## 2020-10-14 NOTE — Assessment & Plan Note (Addendum)
Chronic, stable with BP at goal. Continue Lisinopril 10 MG daily and adjust as needed.  Continue to monitor BP at home and document + focus on DASH diet.  BMP & TSH today. Will consider reduction to discontinuation of Lisinopril next visits if remains under good control.  Return in 6 months or sooner if any changes.

## 2020-10-14 NOTE — Assessment & Plan Note (Signed)
Ongoing, stable with daily Buspar.  She has seen improvement with this.  Continue current medication regimen and adjust as needed.  Denies SI/HI.  Return to office in 6 months.

## 2020-10-14 NOTE — Assessment & Plan Note (Signed)
BMI 37.49.  Recommended eating smaller high protein, low fat meals more frequently and exercising 30 mins a day 5 times a week with a goal of 10-15lb weight loss in the next 3 months. Patient voiced their understanding and motivation to adhere to these recommendations.  

## 2020-10-14 NOTE — Progress Notes (Signed)
BP 120/84   Pulse 73   Temp 97.6 F (36.4 C) (Oral)   Ht 5' 3.23" (1.606 m)   Wt 213 lb 3.2 oz (96.7 kg)   LMP  (LMP Unknown)   SpO2 97%   BMI 37.49 kg/m    Subjective:    Patient ID: Stacie Bell, female    DOB: 11/06/1960, 60 y.o.   MRN: [id number]  HPI: Stacie Bell is a 60 y.o. female  Chief Complaint  Patient presents with  . Hypertension  . Hyperlipidemia  . Mood  . IFG   ANXIETY/STRESS Continues Buspar 5 MG twice a day -- currently taking once a day.   Duration:stable Anxious mood: occasional Excessive worrying: no Irritability: no  Sweating: no Nausea: no Palpitations:no Hyperventilation: no Panic attacks: no Agoraphobia: no  Obscessions/compulsions: no Depressed mood: no Depression screen [facility] 2/9 10/14/2020 04/10/2020 10/04/2019 09/06/2019 08/03/2019  Decreased Interest 0 0 1 1 0  Down, Depressed, Hopeless 0 0 0 0 1  PHQ - 2 Score 0 0 1 1 1   Altered sleeping 0 1 1 2 1   Tired, decreased energy 0 3 1 2 1   Change in appetite 0 0 1 0 0  Feeling bad or failure about yourself  0 0 0 0 0  Trouble concentrating 0 0 1 1 1   Moving slowly or fidgety/restless 0 0 0 0 0  Suicidal thoughts 0 0 0 0 0  PHQ-9 Score 0 4 5 6 4   Difficult doing work/chores - Not difficult at all Not difficult at all Not difficult at all -  Anhedonia: no Weight changes: no Insomnia: occasional Hypersomnia: no Fatigue/loss of energy: sometimes Feelings of worthlessness: no Feelings of guilt: no Impaired concentration/indecisiveness: no Suicidal ideations: no  Crying spells: no Recent Stressors/Life Changes: none   Relationship problems: no   Family stress: none   Financial stress: no    Job stress: no    Recent death/loss: no GAD 7 : Generalized Anxiety Score 10/14/2020 04/10/2020 10/04/2019 09/06/2019  Nervous, Anxious, on Edge 1 0 1 3  Control/stop worrying 0 1 2 3   Worry too much - different things 0 0 1 3  Trouble relaxing 0 0 1 3  Restless 0 0 0 1  Easily annoyed or  irritable 0 1 0 0  Afraid - awful might happen 0 0 0 0  Total GAD 7 Score 1 2 5 13   Anxiety Difficulty - Not difficult at all Not difficult at all Somewhat difficult   HYPERTENSION Continues on Lisinopril 10 MG daily and ASA.Continues on fish oil.  A1c 5.4% -- no diabetes.   Satisfied with current treatment?yes Duration of hypertension:chronic BP monitoring frequency:rarely BP range:120/80 range BP medication side effects:no Medication compliance:good compliance Previous BP meds: Lisinopril Aspirin:yes Recurrent headaches:no Visual changes:no Palpitations:no Dyspnea:no Chest pain: no Lower extremity edema:no Dizzy/lightheaded:no The 10-year ASCVD risk score DC Jr., et al., 2013) is: 4.8%   Values used to calculate the score:     Age: 36 years     Sex: Female     Is Non-Hispanic African American: No     Diabetic: No     Tobacco smoker: No     Systolic Blood Pressure: 120 mmHg     Is BP treated: Yes     HDL Cholesterol: 47 mg/dL     Total Cholesterol: 252 mg/dL   Relevant past medical, surgical, family and social history reviewed and updated as indicated. Interim medical history since our last visit reviewed.  Allergies and medications reviewed and updated.  Review of Systems  Constitutional: Negative for activity change, appetite change, diaphoresis, fatigue and fever.  Respiratory: Negative for cough, chest tightness, shortness of breath and wheezing.   Cardiovascular: Negative for chest pain, palpitations and leg swelling.  Gastrointestinal: Negative.   Endocrine: Negative.   Neurological: Negative.   Psychiatric/Behavioral: Negative for decreased concentration, self-injury, sleep disturbance and suicidal ideas. The patient is not nervous/anxious.     Per HPI unless specifically indicated above     Objective:    BP 120/84   Pulse 73   Temp 97.6 F (36.4 C) (Oral)   Ht 5' 3.23" (1.606 m)   Wt 213 lb 3.2 oz (96.7 kg)   LMP  (LMP  Unknown)   SpO2 97%   BMI 37.49 kg/m   Wt Readings from Last 3 Encounters:  10/14/20 213 lb 3.2 oz (96.7 kg)  07/03/20 202 lb 3.2 oz (91.7 kg)  06/11/20 195 lb (88.5 kg)    Physical Exam Vitals and nursing note reviewed.  Constitutional:      General: She is awake. She is not in acute distress.    Appearance: She is well-developed and well-groomed. She is obese. She is not ill-appearing.  HENT:     Head: Normocephalic.     Right Ear: Hearing normal.     Left Ear: Hearing normal.  Eyes:     General: Lids are normal.        Right eye: No discharge.        Left eye: No discharge.     Conjunctiva/sclera: Conjunctivae normal.     Pupils: Pupils are equal, round, and reactive to light.  Neck:     Thyroid: No thyromegaly.     Vascular: No carotid bruit.  Cardiovascular:     Rate and Rhythm: Normal rate and regular rhythm.     Heart sounds: Normal heart sounds. No murmur heard. No gallop.   Pulmonary:     Effort: Pulmonary effort is normal. No accessory muscle usage or respiratory distress.     Breath sounds: Normal breath sounds.  Abdominal:     General: Bowel sounds are normal.     Palpations: Abdomen is soft.  Musculoskeletal:     Cervical back: Normal range of motion and neck supple.     Right lower leg: No edema.     Left lower leg: No edema.     Right ankle: Normal.     Left ankle: Normal.  Skin:    General: Skin is warm and dry.  Neurological:     Mental Status: She is alert and oriented to person, place, and time.  Psychiatric:        Attention and Perception: Attention normal.        Mood and Affect: Mood normal.        Speech: Speech normal.        Behavior: Behavior normal. Behavior is cooperative.        Thought Content: Thought content normal.    Results for orders placed or performed in visit on 04/10/20  Basic metabolic panel  Result Value Ref Range   Glucose 86 65 - 99 mg/dL   BUN 15 6 - 24 mg/dL   Creatinine, Ser 4.31 0.57 - 1.00 mg/dL   GFR calc  non Af Amer 96 >59 mL/min/1.73   GFR calc Af Amer 111 >59 mL/min/1.73   BUN/Creatinine Ratio 22 9 - 23   Sodium 141 134 - 144 mmol/L  Potassium 4.2 3.5 - 5.2 mmol/L   Chloride 104 96 - 106 mmol/L   CO2 25 20 - 29 mmol/L   Calcium 9.4 8.7 - 10.2 mg/dL  Lipid Panel w/o Chol/HDL Ratio  Result Value Ref Range   Cholesterol, Total 252 (H) 100 - 199 mg/dL   Triglycerides 381 (H) 0 - 149 mg/dL   HDL 47 >01 mg/dL   VLDL Cholesterol Cal 32 5 - 40 mg/dL   LDL Chol Calc (NIH) 751 (H) 0 - 99 mg/dL  HM COLONOSCOPY  Result Value Ref Range   [facility] Colonoscopy See Report (in chart) See Report (in chart), Patient Reported      Assessment & Plan:   Problem List Items Addressed This Visit      Cardiovascular and Mediastinum   Hypertension - Primary    Chronic, stable with BP at goal. Continue Lisinopril 10 MG daily and adjust as needed.  Continue to monitor BP at home and document + focus on DASH diet.  BMP & TSH today. Will consider reduction to discontinuation of Lisinopril next visits if remains under good control.  Return in 6 months or sooner if any changes.      Relevant Medications   lisinopril (ZESTRIL) 10 MG tablet   Other Relevant Orders   Basic Metabolic Panel (BMET)   TSH     Endocrine   IFG (impaired fasting glucose)    Continues to be well controlled on diet, A1c today 5.2%.      Relevant Orders   Bayer DCA Hb A1c Waived (STAT)     Other   Situational anxiety    Ongoing, stable with daily Buspar.  She has seen improvement with this.  Continue current medication regimen and adjust as needed.  Denies SI/HI.  Return to office in 6 months.      Relevant Medications   busPIRone (BUSPAR) 5 MG tablet   Elevated LDL cholesterol level    LDL 173 on past labs, but ASCVD 4.8%.  Discussed with patient and educated.  IF LDL >190 will start statin, at this time recheck lipid panel and continue diet focus.      Relevant Orders   Lipid Profile   Obesity    BMI 37.49.  Recommended  eating smaller high protein, low fat meals more frequently and exercising 30 mins a day 5 times a week with a goal of 10-15lb weight loss in the next 3 months. Patient voiced their understanding and motivation to adhere to these recommendations.           Follow up plan: Return in about 6 months (around 04/13/2021) for HTN/HLD, GERD, IFG, MOOD.

## 2020-10-14 NOTE — Assessment & Plan Note (Signed)
Continues to be well controlled on diet, A1c today 5.2%.

## 2020-10-15 ENCOUNTER — Encounter: Payer: Self-pay | Admitting: Nurse Practitioner

## 2020-10-15 LAB — BASIC METABOLIC PANEL
BUN/Creatinine Ratio: 16 (ref 9–23)
BUN: 10 mg/dL (ref 6–24)
CO2: 20 mmol/L (ref 20–29)
Calcium: 9.8 mg/dL (ref 8.7–10.2)
Chloride: 105 mmol/L (ref 96–106)
Creatinine, Ser: 0.61 mg/dL (ref 0.57–1.00)
GFR calc Af Amer: 115 mL/min/{1.73_m2} (ref >59)
GFR calc non Af Amer: 100 mL/min/{1.73_m2} (ref >59)
Glucose: 85 mg/dL (ref 65–99)
Potassium: 4 mmol/L (ref 3.5–5.2)
Sodium: 142 mmol/L (ref 134–144)

## 2020-10-15 LAB — LIPID PANEL
Chol/HDL Ratio: 5.6 ratio — ABNORMAL HIGH (ref 0.0–4.4)
Cholesterol, Total: 253 mg/dL — ABNORMAL HIGH (ref 100–199)
HDL: 45 mg/dL (ref >39)
LDL Chol Calc (NIH): 166 mg/dL — ABNORMAL HIGH (ref 0–99)
Triglycerides: 227 mg/dL — ABNORMAL HIGH (ref 0–149)
VLDL Cholesterol Cal: 42 mg/dL — ABNORMAL HIGH (ref 5–40)

## 2020-10-15 LAB — HIV ANTIBODY (ROUTINE TESTING W REFLEX): HIV Screen 4th Generation wRfx: NONREACTIVE

## 2020-10-15 LAB — HEPATITIS C ANTIBODY: Hep C Virus Ab: <0.1 s/co ratio (ref 0.0–0.9)

## 2020-10-15 LAB — TSH: TSH: 2.13 u[IU]/mL (ref 0.450–4.500)

## 2020-10-15 NOTE — Progress Notes (Signed)
Letter sent.

## 2020-10-16 DIAGNOSIS — M546 Pain in thoracic spine: Secondary | ICD-10-CM | POA: Diagnosis not present

## 2020-10-16 DIAGNOSIS — M7552 Bursitis of left shoulder: Secondary | ICD-10-CM | POA: Diagnosis not present

## 2020-10-18 DIAGNOSIS — M722 Plantar fascial fibromatosis: Secondary | ICD-10-CM | POA: Diagnosis not present

## 2020-10-18 DIAGNOSIS — M25475 Effusion, left foot: Secondary | ICD-10-CM | POA: Diagnosis not present

## 2020-10-18 DIAGNOSIS — S92035D Nondisplaced avulsion fracture of tuberosity of left calcaneus, subsequent encounter for fracture with routine healing: Secondary | ICD-10-CM | POA: Diagnosis not present

## 2020-10-18 DIAGNOSIS — M79672 Pain in left foot: Secondary | ICD-10-CM | POA: Diagnosis not present

## 2020-11-27 DIAGNOSIS — M546 Pain in thoracic spine: Secondary | ICD-10-CM | POA: Diagnosis not present

## 2020-11-27 DIAGNOSIS — S29012A Strain of muscle and tendon of back wall of thorax, initial encounter: Secondary | ICD-10-CM | POA: Diagnosis not present

## 2020-11-27 DIAGNOSIS — M7552 Bursitis of left shoulder: Secondary | ICD-10-CM | POA: Diagnosis not present

## 2020-11-27 DIAGNOSIS — M47812 Spondylosis without myelopathy or radiculopathy, cervical region: Secondary | ICD-10-CM | POA: Diagnosis not present
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
## 2020-12-02 ENCOUNTER — Other Ambulatory Visit: Payer: Self-pay

## 2020-12-02 ENCOUNTER — Encounter: Payer: Self-pay | Admitting: Obstetrics and Gynecology

## 2020-12-02 ENCOUNTER — Ambulatory Visit (INDEPENDENT_AMBULATORY_CARE_PROVIDER_SITE_OTHER): Payer: BC Managed Care – PPO | Admitting: Obstetrics and Gynecology

## 2020-12-02 ENCOUNTER — Other Ambulatory Visit (HOSPITAL_COMMUNITY)
Admission: RE | Admit: 2020-12-02 | Discharge: 2020-12-02 | Disposition: A | Discharge status: Home / Self Care | Payer: BC Managed Care – PPO | Source: Ambulatory Visit | Attending: Obstetrics and Gynecology | Admitting: Obstetrics and Gynecology

## 2020-12-02 VITALS — BP 110/80 | Ht 65.0 in | Wt 213.0 lb

## 2020-12-02 DIAGNOSIS — Z8742 Personal history of other diseases of the female genital tract: Secondary | ICD-10-CM | POA: Insufficient documentation

## 2020-12-02 DIAGNOSIS — Z124 Encounter for screening for malignant neoplasm of cervix: Secondary | ICD-10-CM

## 2020-12-02 DIAGNOSIS — Z1231 Encounter for screening mammogram for malignant neoplasm of breast: Secondary | ICD-10-CM

## 2020-12-02 DIAGNOSIS — N760 Acute vaginitis: Secondary | ICD-10-CM

## 2020-12-02 DIAGNOSIS — Z1151 Encounter for screening for human papillomavirus (HPV): Secondary | ICD-10-CM | POA: Insufficient documentation

## 2020-12-02 DIAGNOSIS — Z01419 Encounter for gynecological examination (general) (routine) without abnormal findings: Secondary | ICD-10-CM | POA: Diagnosis not present

## 2020-12-02 MED ORDER — CLOTRIMAZOLE-BETAMETHASONE 1-0.05 % EX CREA: TOPICAL_CREAM | CUTANEOUS | 0 refills | Status: DC

## 2020-12-02 NOTE — Patient Instructions (Signed)
I value your feedback and you entrusting us with your care. If you get a [facility] patient survey, I would appreciate you taking the time to let us know about your experience today. Thank you! ? ? ?

## 2020-12-03 ENCOUNTER — Ambulatory Visit
Admission: RE | Admit: 2020-12-03 | Discharge: 2020-12-03 | Disposition: A | Discharge status: Home / Self Care | Payer: BC Managed Care – PPO | Source: Ambulatory Visit | Attending: Nurse Practitioner | Admitting: Nurse Practitioner

## 2020-12-03 DIAGNOSIS — Z1231 Encounter for screening mammogram for malignant neoplasm of breast: Secondary | ICD-10-CM

## 2020-12-03 IMAGING — MG MM DIGITAL SCREENING BILAT W/ TOMO AND CAD
8 series · 8 of 24 positions shown · non-contrast
Comparison: Previous exam(s).

CLINICAL DATA: Screening.

EXAM:
DIGITAL SCREENING BILATERAL MAMMOGRAM WITH TOMOSYNTHESIS AND CAD
TECHNIQUE: Bilateral screening digital craniocaudal and mediolateral oblique
mammograms were obtained. Bilateral screening digital breast
tomosynthesis was performed. The images were evaluated with
computer-aided detection.

[R MLO synth-2D]
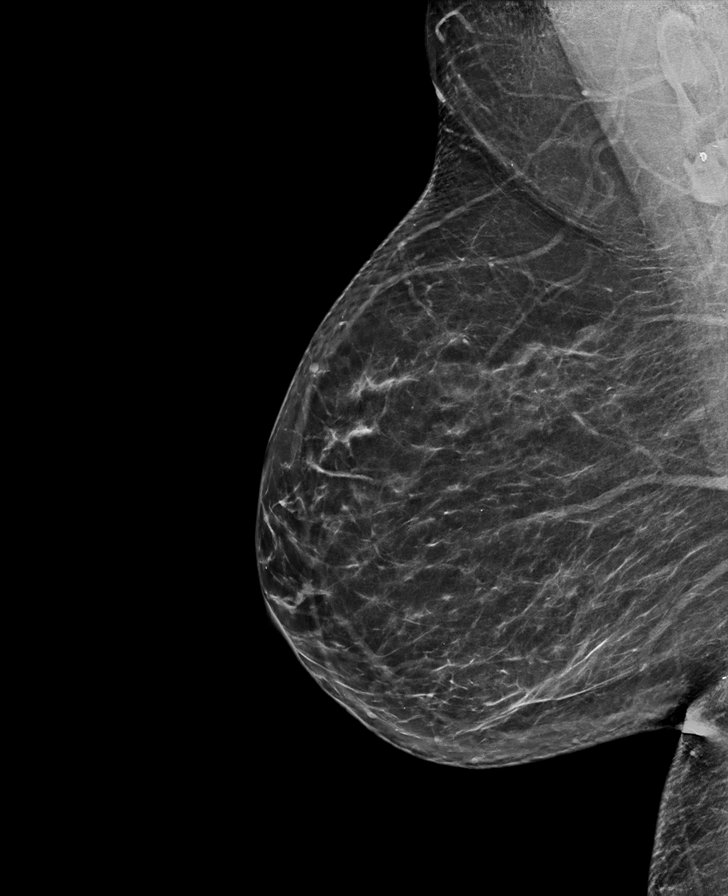

[R CC synth-2D]
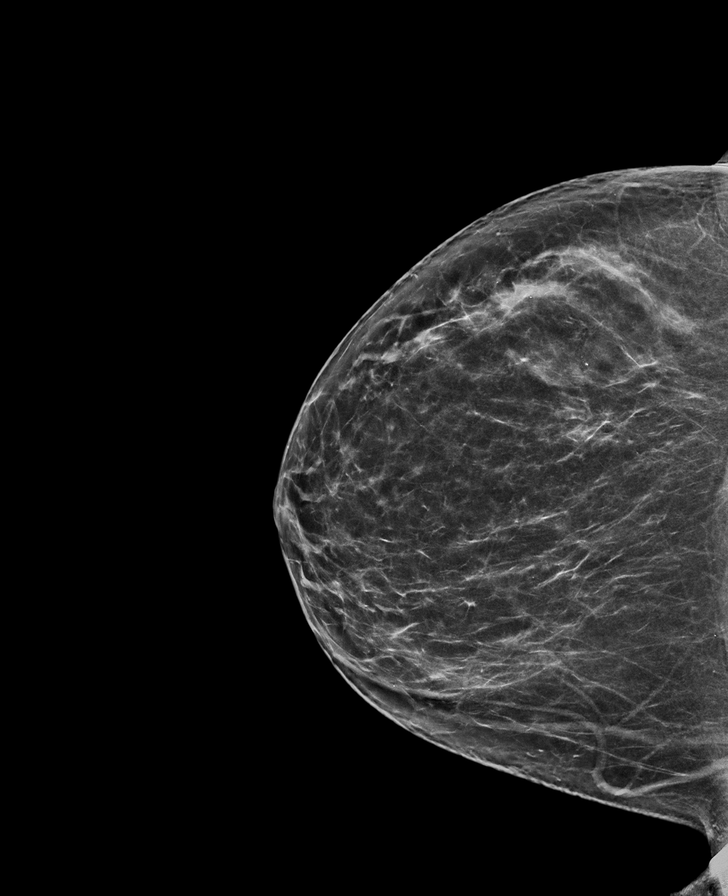

[L CC synth-2D]
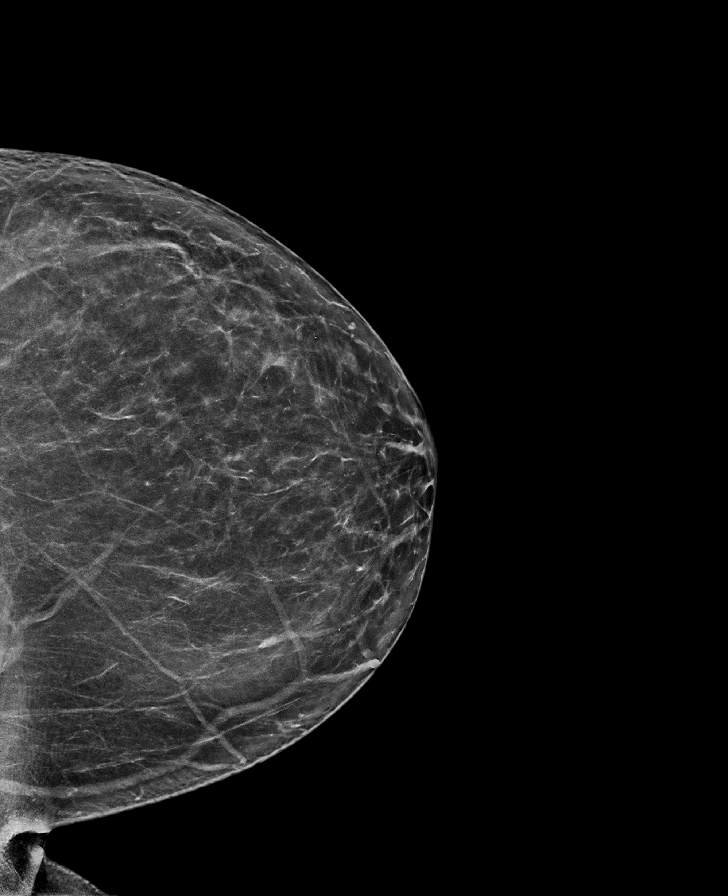

[L MLO synth-2D]
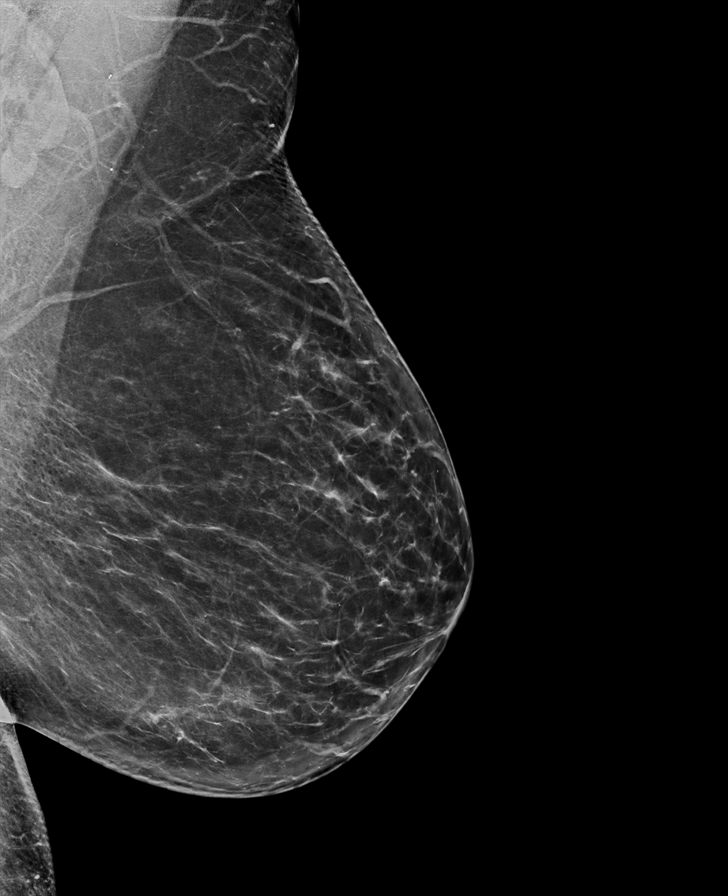

[L MLO tomo · tomo slice 41/80.0]
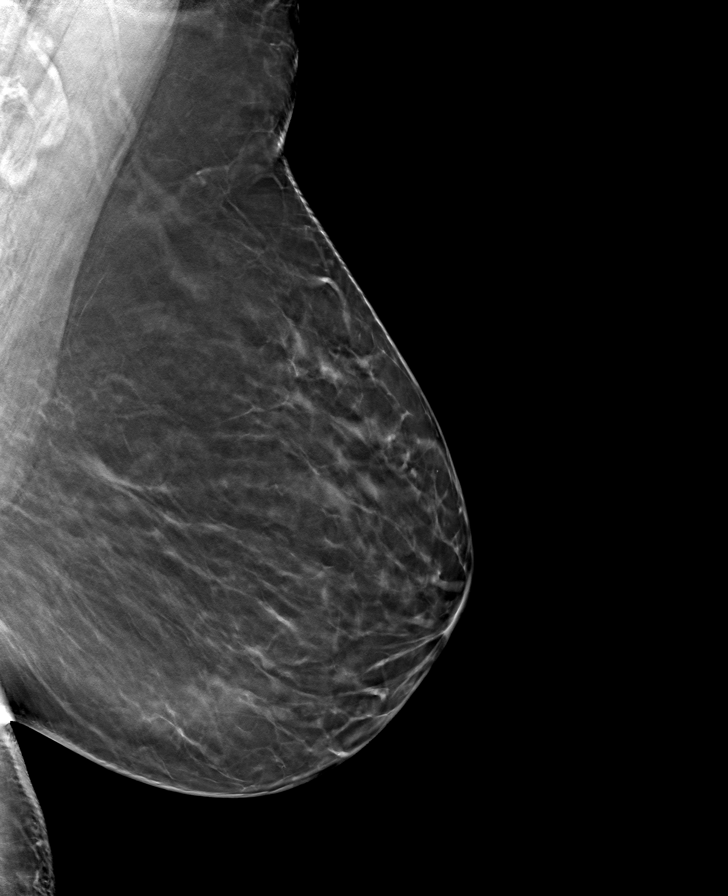

[L CC tomo · tomo slice 39/76.0]
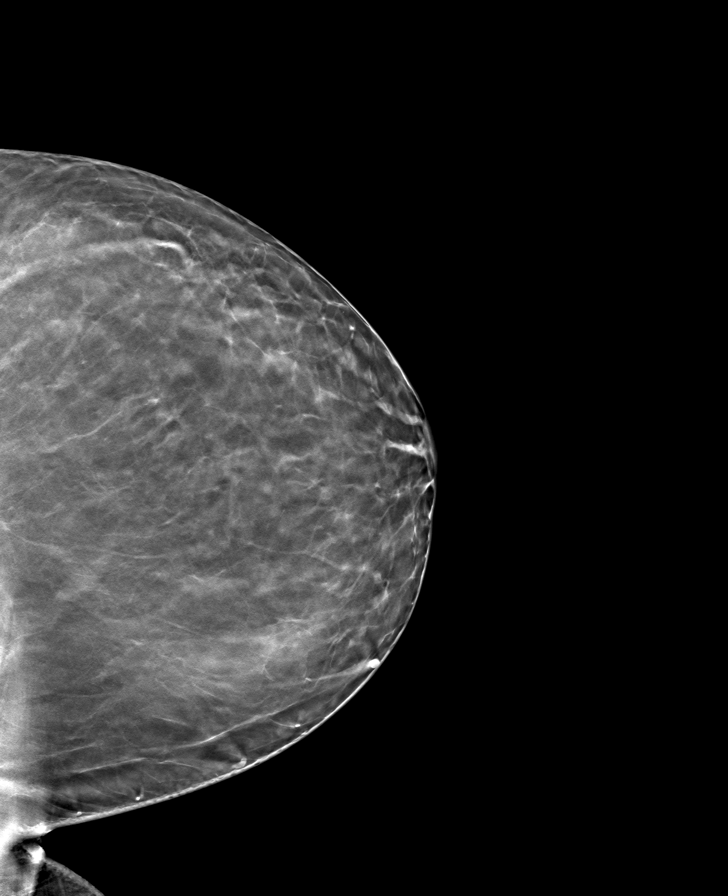

[R CC tomo · tomo slice 36/71.0]
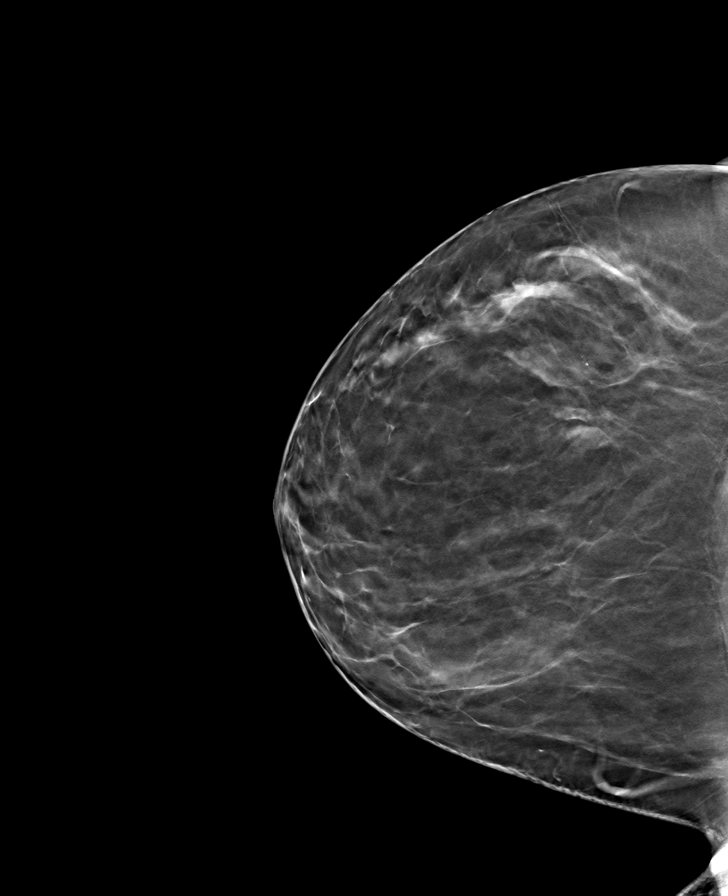

[R MLO tomo · tomo slice 37/74.0]
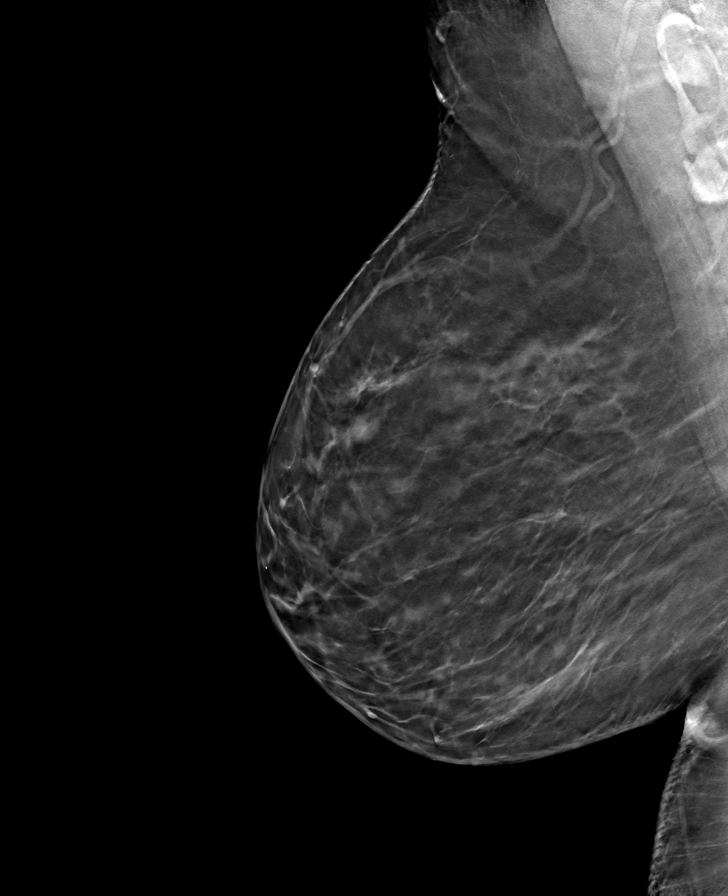

[8 of 24 positions shown; findings below may reference images not displayed]

ACR Breast Density Category b: There are scattered areas of
fibroglandular density.
FINDINGS: There are no findings suspicious for malignancy. The images were
evaluated with computer-aided detection.
IMPRESSION: No mammographic evidence of malignancy. A result letter of this
screening mammogram will be mailed directly to the patient.

RECOMMENDATION:
Screening mammogram in one year. (Code:[OD])

BI-RADS CATEGORY  1: Negative.

## 2020-12-04 ENCOUNTER — Other Ambulatory Visit: Payer: Self-pay | Admitting: Nurse Practitioner

## 2020-12-04 LAB — CYTOLOGY - PAP
Comment: NEGATIVE
Diagnosis: NEGATIVE
High risk HPV: NEGATIVE

## 2020-12-25 DIAGNOSIS — L570 Actinic keratosis: Secondary | ICD-10-CM | POA: Diagnosis not present

## 2020-12-25 DIAGNOSIS — D485 Neoplasm of uncertain behavior of skin: Secondary | ICD-10-CM | POA: Diagnosis not present

## 2020-12-25 DIAGNOSIS — D2272 Melanocytic nevi of left lower limb, including hip: Secondary | ICD-10-CM | POA: Diagnosis not present

## 2020-12-25 DIAGNOSIS — D2262 Melanocytic nevi of left upper limb, including shoulder: Secondary | ICD-10-CM | POA: Diagnosis not present

## 2020-12-25 DIAGNOSIS — D0462 Carcinoma in situ of skin of left upper limb, including shoulder: Secondary | ICD-10-CM | POA: Diagnosis not present

## 2020-12-25 DIAGNOSIS — D2261 Melanocytic nevi of right upper limb, including shoulder: Secondary | ICD-10-CM | POA: Diagnosis not present

## 2020-12-25 DIAGNOSIS — D225 Melanocytic nevi of trunk: Secondary | ICD-10-CM | POA: Diagnosis not present

## 2021-01-06 ENCOUNTER — Telehealth: Payer: Self-pay

## 2021-01-06 NOTE — Telephone Encounter (Signed)
Noted  

## 2021-01-06 NOTE — Telephone Encounter (Signed)
Please advise if pt can be seen in office   Copied from CRM [id number]. Topic: Appointment Scheduling - Scheduling Inquiry for Clinic >> Jan 06, 2021  8:54 AM Stacie Bell A wrote: Reason for CRM: Patient would like to be seen by their PCP for lymph node concerns  Patient disclosed that they are also experiencing ear and throat soreness (since Fri 01/03/21)  Per DT, agent was unable to schedule an office visit at the time of call  Please contact to further advise scheduling when possible

## 2021-01-06 NOTE — Telephone Encounter (Signed)
Called pt she states that she can do in home covid test today scheduled her for in office visit tomorrow with lauren as Jolene's schedule is full. Pt will call to change appt to virtual if covid positive

## 2021-01-06 NOTE — Telephone Encounter (Signed)
We can see in office, she will need negative home Covid test first and then we can see here in office for further assessment of her concerns.  Please alert and schedule.

## 2021-01-07 ENCOUNTER — Ambulatory Visit: Payer: BC Managed Care – PPO | Admitting: Nurse Practitioner

## 2021-01-07 ENCOUNTER — Encounter: Payer: Self-pay | Admitting: Nurse Practitioner

## 2021-01-07 ENCOUNTER — Other Ambulatory Visit: Payer: Self-pay

## 2021-01-07 VITALS — BP 114/75 | HR 84 | Temp 98.2°F | Wt 211.2 lb

## 2021-01-07 DIAGNOSIS — R599 Enlarged lymph nodes, unspecified: Secondary | ICD-10-CM | POA: Diagnosis not present
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
## 2021-01-07 NOTE — Patient Instructions (Addendum)
It was great to see you!  You can take zyrtec or claritin or generics over the counter. Also you can get flonase over the counter and use it once a day. You can use warm compresses on your swollen lymph nodes. Try not to rub them too much.   Take care, Rodman Pickle, NP   Lymphadenopathy  Lymphadenopathy means that your lymph glands are swollen or larger than normal. Lymph glands, also called lymph nodes, are collections of tissue that filter excess fluid, bacteria, viruses, and waste from your bloodstream. They are part of your body's disease-fighting system (immune system), which protects your body from germs. There may be different causes of lymphadenopathy, depending on where it is in your body. Some types go away on their own. Lymphadenopathy can occur anywhere that you have lymph glands, including these areas:  Neck (cervical lymphadenopathy).  Chest (mediastinal lymphadenopathy).  Lungs (hilar lymphadenopathy).  Underarms (axillary lymphadenopathy).  Groin (inguinal lymphadenopathy). When your immune system responds to germs, infection-fighting cells and fluid build up in your lymph glands. This causes some swelling and enlargement. If the lymph nodes do not go back to normal size after you have an infection or disease, your health care provider may do tests. These tests help to monitor your condition and find the reason why the glands are still swollen and enlarged. Follow these instructions at home:  Get plenty of rest.  Your health care provider may recommend over-the-counter medicines for pain. Take over-the-counter and prescription medicines only as told by your health care provider.  If directed, apply heat to swollen lymph glands as often as told by your health care provider. Use the heat source that your health care provider recommends, such as a moist heat pack or a heating pad. ? Place a towel between your skin and the heat source. ? Leave the heat on for 20-30  minutes. ? Remove the heat if your skin turns bright red. This is especially important if you are unable to feel pain, heat, or cold. You may have a greater risk of getting burned.  Check your affected lymph glands every day for changes. Check other lymph gland areas as told by your health care provider. Check for changes such as: ? More swelling. ? Sudden increase in size. ? Redness or pain. ? Hardness.  Keep all follow-up visits. This is important.   Contact a health care provider if you have:  Lymph glands that: ? Are still swollen after 2 weeks. ? Have suddenly gotten bigger or the swelling spreads. ? Are red, painful, or hard.  Fluid leaking from the skin near an enlarged lymph gland.  Problems with breathing.  A fever, chills, or night sweats.  Fatigue.  A sore throat.  Pain in your abdomen.  Weight loss. Get help right away if you have:  Severe pain.  Chest pain.  Shortness of breath. These symptoms may represent a serious problem that is an emergency. Do not wait to see if the symptoms will go away. Get medical help right away. Call your local emergency services (911 in the [place] Do not drive yourself to the hospital. Summary  Lymphadenopathy means that your lymph glands are swollen or larger than normal.  Lymph glands, also called lymph nodes, are collections of tissue that filter excess fluid, bacteria, viruses, and waste from the bloodstream. They are part of your body's disease-fighting system (immune system).  Lymphadenopathy can occur anywhere that you have lymph glands.  If the lymph nodes do not go  back to normal size after you have an infection or disease, your health care provider may do tests to monitor your condition and find the reason why the glands are still swollen and enlarged.  Check your affected lymph glands every day for changes. Check other lymph gland areas as told by your health care provider. This information is not intended to  replace advice given to you by your health care provider. Make sure you discuss any questions you have with your health care provider. Document Revised: 06/12/2020 Document Reviewed: 06/12/2020 Elsevier Patient Education  2021 [employer].

## 2021-01-07 NOTE — Assessment & Plan Note (Signed)
Acute, resolving. Most likely in relation to virus vs allergy symptoms. She can take zyrtec or claritin daily and flonase daily. Warm compresses to the swollen lymph nodes can help with discomfort. Can also take ibuprofen or tylenol as needed for pain. Follow-up if symptoms worsen or don't improve.

## 2021-01-29 DIAGNOSIS — L570 Actinic keratosis: Secondary | ICD-10-CM | POA: Diagnosis not present

## 2021-01-29 DIAGNOSIS — D0462 Carcinoma in situ of skin of left upper limb, including shoulder: Secondary | ICD-10-CM | POA: Diagnosis not present

## 2021-03-11 DIAGNOSIS — H1045 Other chronic allergic conjunctivitis: Secondary | ICD-10-CM | POA: Diagnosis not present

## 2021-04-14 ENCOUNTER — Ambulatory Visit: Payer: BC Managed Care – PPO | Admitting: Nurse Practitioner

## 2021-04-14 ENCOUNTER — Encounter: Payer: Self-pay | Admitting: Nurse Practitioner

## 2021-04-14 ENCOUNTER — Other Ambulatory Visit: Payer: Self-pay

## 2021-04-14 VITALS — BP 124/75 | HR 70 | Temp 98.0°F | Wt 211.4 lb

## 2021-04-14 DIAGNOSIS — K219 Gastro-esophageal reflux disease without esophagitis: Secondary | ICD-10-CM

## 2021-04-14 DIAGNOSIS — F418 Other specified anxiety disorders: Secondary | ICD-10-CM

## 2021-04-14 DIAGNOSIS — J301 Allergic rhinitis due to pollen: Secondary | ICD-10-CM | POA: Diagnosis not present

## 2021-04-14 DIAGNOSIS — N760 Acute vaginitis: Secondary | ICD-10-CM | POA: Insufficient documentation

## 2021-04-14 DIAGNOSIS — B373 Candidiasis of vulva and vagina: Secondary | ICD-10-CM | POA: Insufficient documentation

## 2021-04-14 DIAGNOSIS — B3731 Acute candidiasis of vulva and vagina: Secondary | ICD-10-CM

## 2021-04-14 DIAGNOSIS — I10 Essential (primary) hypertension: Secondary | ICD-10-CM

## 2021-04-14 DIAGNOSIS — Z6835 Body mass index (BMI) 35.0-35.9, adult: Secondary | ICD-10-CM

## 2021-04-14 DIAGNOSIS — E7800 Pure hypercholesterolemia, unspecified: Secondary | ICD-10-CM | POA: Diagnosis not present

## 2021-04-14 DIAGNOSIS — B9689 Other specified bacterial agents as the cause of diseases classified elsewhere: Secondary | ICD-10-CM | POA: Insufficient documentation

## 2021-04-14 DIAGNOSIS — R350 Frequency of micturition: Secondary | ICD-10-CM

## 2021-04-14 DIAGNOSIS — E6609 Other obesity due to excess calories: Secondary | ICD-10-CM

## 2021-04-14 DIAGNOSIS — R42 Dizziness and giddiness: Secondary | ICD-10-CM

## 2021-04-14 LAB — WET PREP FOR TRICH, YEAST, CLUE
Clue Cell Exam: POSITIVE — AB
Trichomonas Exam: NEGATIVE
Yeast Exam: POSITIVE — AB

## 2021-04-14 MED ORDER — METRONIDAZOLE 500 MG PO TABS: 500.0000 mg | ORAL_TABLET | Freq: Two times a day (BID) | ORAL | 0 refills | Status: AC

## 2021-04-14 MED ORDER — FLUCONAZOLE 150 MG PO TABS: 150.0000 mg | ORAL_TABLET | Freq: Once | ORAL | 0 refills | Status: AC

## 2021-04-14 NOTE — Assessment & Plan Note (Signed)
Chronic, stable.  Continue Protonix, which has offered benefit to her GERD symptoms.  Consider trial reduction in future.  Mag level at physical annually.  Return in 6 months.

## 2021-04-14 NOTE — Progress Notes (Signed)
BP 124/75   Pulse 70   Temp 98 F (36.7 C) (Oral)   Wt 211 lb 6.4 oz (95.9 kg)   LMP  (LMP Unknown)   SpO2 96%   BMI 35.18 kg/m    Subjective:    Patient ID: Stacie Bell, female    DOB: 15-Apr-1961, 60 y.o.   MRN: [id number]  HPI: Stacie Bell is a 60 y.o. female  Chief Complaint  Patient presents with   Hypertension   Hyperlipidemia   Gastroesophageal Reflux   Mood   Urinary Frequency    Patient state she has been symptomatic for about 4-5 days of having bad odor with urine and urinary frequency and real dark colored urine and patient states she only notices it more in the mornings and states the urine gets clear throughout the day.     Dizziness    Patient states she notices she is having some episodes of light-headedness in the mornings and states she was placed on Lisinopril for elevated BP due to stress related family issues. Patient state she takes the medication at 4 in the morning and she notices it after she takes and goes to work at 5 am.    Sore Throat    Patient states she has been having sore throat off and on for the past 2 weeks and states she does not know if it is from snoring or the weather change.    ANXIETY/STRESS Currently has not taken Buspar in one month -- has 5 MG to take as needed.   Duration:stable Anxious mood: none Excessive worrying: no Irritability: no  Sweating: no Nausea: no Palpitations:no Hyperventilation: no Panic attacks: no Agoraphobia: no  Obscessions/compulsions: no Depressed mood: no Depression screen [facility] 2/9 04/14/2021 10/14/2020 04/10/2020 10/04/2019 09/06/2019  Decreased Interest 0 0 0 1 1  Down, Depressed, Hopeless 0 0 0 0 0  PHQ - 2 Score 0 0 0 1 1  Altered sleeping 3 0 1 1 2   Tired, decreased energy 3 0 3 1 2   Change in appetite 0 0 0 1 0  Feeling bad or failure about yourself  0 0 0 0 0  Trouble concentrating 0 0 0 1 1  Moving slowly or fidgety/restless 0 0 0 0 0  Suicidal thoughts 0 0 0 0 0  PHQ-9 Score 6 0 4 5  6   Difficult doing work/chores Not difficult at all - Not difficult at all Not difficult at all Not difficult at all  Anhedonia: no Weight changes: no Insomnia: occasional Hypersomnia: no Fatigue/loss of energy: sometimes Feelings of worthlessness: no Feelings of guilt: no Impaired concentration/indecisiveness: no Suicidal ideations: no  Crying spells: no Recent Stressors/Life Changes: none   Relationship problems: no   Family stress: none   Financial stress: no    Job stress: no    Recent death/loss: no GAD 7 : Generalized Anxiety Score 04/14/2021 10/14/2020 04/10/2020 10/04/2019  Nervous, Anxious, on Edge 1 1 0 1  Control/stop worrying 0 0 1 2  Worry too much - different things 0 0 0 1  Trouble relaxing 0 0 0 1  Restless 0 0 0 0  Easily annoyed or irritable 0 0 1 0  Afraid - awful might happen 0 0 0 0  Total GAD 7 Score 1 1 2 5   Anxiety Difficulty Not difficult at all - Not difficult at all Not difficult at all   HYPERTENSION Continues on Lisinopril 10 MG daily and ASA.  Continues on fish oil.  A1c 5.2% -- no diabetes. Does endorse some dizziness being present in mornings after taking Lisinopril -- usually a couple hours after taking dose.  Does not eat a whole lot for breakfast, so unsure if sugar is low then.  Dizziness lasts one hour, improves a little after eating snack.   Satisfied with current treatment? yes Duration of hypertension: chronic BP monitoring frequency:  rarely BP range: 120/80 range BP medication side effects:  no Medication compliance: good compliance Previous BP meds: Lisinopril Aspirin: yes Recurrent headaches: no Visual changes: no Palpitations: no Dyspnea: no Chest pain: no Lower extremity edema: no Dizzy/lightheaded: no  The 10-year ASCVD risk score Denman George(Goff DC Jr., et al., 2013) is: 5.3%   Values used to calculate the score:     Age: [age] years     Sex: Female     Is Non-Hispanic African American: No     Diabetic: No     Tobacco smoker: No      Systolic Blood Pressure: 124 mmHg     Is BP treated: Yes     HDL Cholesterol: 45 mg/dL     Total Cholesterol: 253 mg/dL  URINARY SYMPTOMS Some urinary frequency -- noted over the past week.  Does endorse drinking a lot more water and color in morning darker with odor.   Dysuria: no Urinary frequency: yes Urgency: yes Small volume voids: no Symptom severity: no Urinary incontinence: no Foul odor: yes Hematuria: no Abdominal pain: no Back pain: no Suprapubic pain/pressure: no Flank pain: no Fever:  no Vomiting: no Relief with cranberry juice: no Relief with pyridium: no Status: stable Previous urinary tract infection: yes Recurrent urinary tract infection: no Sexual activity: No sexually active History of sexually transmitted disease: no Treatments attempted: increasing fluids    SORE THROAT Has been present for a couple weeks, she is not sure if it is change in weather or snoring.  She does endorse occasional dry mouth in morning, no headaches.  She is unsure if she snores, as no one in home with her.  Sore throat is a 5/10.  She is taking Protonix 40 MG daily for GERD. Duration: weeks Runny nose: none Nasal congestion: no Nasal itching: no Sneezing: no Eye swelling, itching or discharge: no Post nasal drip: no Cough: no Sinus pressure: no  Ear pain: none Ear pressure: yes bilateral Fever: none Symptoms occur seasonally: yes Symptoms occur perenially: no Known environmental allergy: yes Indoor pets: no History of asthma: no Current allergy medications: none Treatments attempted:  none  Relevant past medical, surgical, family and social history reviewed and updated as indicated. Interim medical history since our last visit reviewed. Allergies and medications reviewed and updated.  Review of Systems  Constitutional:  Negative for activity change, appetite change, diaphoresis, fatigue and fever.  HENT:  Positive for sore throat. Negative for congestion,  postnasal drip, rhinorrhea, sinus pressure and sinus pain.   Respiratory:  Negative for cough, chest tightness, shortness of breath and wheezing.   Cardiovascular:  Negative for chest pain, palpitations and leg swelling.  Gastrointestinal: Negative.   Endocrine: Negative.   Genitourinary:  Positive for frequency.  Neurological: Negative.   Psychiatric/Behavioral:  Negative for decreased concentration, self-injury, sleep disturbance and suicidal ideas. The patient is not nervous/anxious.    Per HPI unless specifically indicated above     Objective:    BP 124/75   Pulse 70   Temp 98 F (36.7 C) (Oral)   Wt 211 lb 6.4 oz (95.9 kg)   LMP  (  LMP Unknown)   SpO2 96%   BMI 35.18 kg/m   Wt Readings from Last 3 Encounters:  04/14/21 211 lb 6.4 oz (95.9 kg)  01/07/21 211 lb 3.2 oz (95.8 kg)  12/02/20 213 lb (96.6 kg)    Physical Exam Vitals and nursing note reviewed.  Constitutional:      General: She is awake. She is not in acute distress.    Appearance: She is well-developed and well-groomed. She is obese. She is not ill-appearing or toxic-appearing.  HENT:     Head: Normocephalic.     Right Ear: Hearing, tympanic membrane, ear canal and external ear normal.     Left Ear: Hearing, tympanic membrane, ear canal and external ear normal.     Nose: Nose normal.     Right Sinus: No maxillary sinus tenderness or frontal sinus tenderness.     Left Sinus: No maxillary sinus tenderness or frontal sinus tenderness.     Mouth/Throat:     Mouth: Mucous membranes are moist.     Pharynx: Posterior oropharyngeal erythema (mild with cobblestone appearance) present. No pharyngeal swelling, oropharyngeal exudate or uvula swelling.  Eyes:     General: Lids are normal.        Right eye: No discharge.        Left eye: No discharge.     Conjunctiva/sclera: Conjunctivae normal.     Pupils: Pupils are equal, round, and reactive to light.  Neck:     Thyroid: No thyromegaly.     Vascular: No carotid  bruit.  Cardiovascular:     Rate and Rhythm: Normal rate and regular rhythm.     Heart sounds: Normal heart sounds. No murmur heard.   No gallop.  Pulmonary:     Effort: Pulmonary effort is normal. No accessory muscle usage or respiratory distress.     Breath sounds: Normal breath sounds.  Abdominal:     General: Bowel sounds are normal.     Palpations: Abdomen is soft.     Tenderness: There is no abdominal tenderness. There is no right CVA tenderness or left CVA tenderness.  Musculoskeletal:     Cervical back: Normal range of motion and neck supple.     Right lower leg: No edema.     Left lower leg: No edema.     Right ankle: Normal.     Left ankle: Normal.  Lymphadenopathy:     Head:     Right side of head: No submental, submandibular, tonsillar, preauricular or posterior auricular adenopathy.     Left side of head: No submental, submandibular, tonsillar, preauricular or posterior auricular adenopathy.  Skin:    General: Skin is warm and dry.  Neurological:     Mental Status: She is alert and oriented to person, place, and time.  Psychiatric:        Attention and Perception: Attention normal.        Mood and Affect: Mood normal.        Speech: Speech normal.        Behavior: Behavior normal. Behavior is cooperative.        Thought Content: Thought content normal.   Results for orders placed or performed in visit on 12/02/20  Cytology - PAP  Result Value Ref Range   High risk HPV Negative    Adequacy      Satisfactory for evaluation; transformation zone component PRESENT.   Diagnosis      - Negative for intraepithelial lesion or malignancy (NILM)   Comment  Normal Reference Range HPV - Negative       Assessment & Plan:   Problem List Items Addressed This Visit       Cardiovascular and Mediastinum   Hypertension - Primary    Chronic, stable with BP at goal. Continue Lisinopril 10 MG daily and adjust as needed.  Continue to monitor BP at home and document + focus on  DASH diet.  BMP today. Will consider reduction to discontinuation of Lisinopril next visits if remains under good control.  Return in 6 months or sooner if any changes.        Respiratory   Allergic rhinitis    Chronic, ongoing, suspect recent sore throat related to allergies as has been outside doing yard work when presents.  Recommend she start taking Claritin 10 MG daily and use Flonase daily.  If any worsening or ongoing return to office.        Digestive   Acid reflux    Chronic, stable.  Continue Protonix, which has offered benefit to her GERD symptoms.  Consider trial reduction in future.  Mag level at physical annually.  Return in 6 months.        Genitourinary   Bacterial vaginosis    With urinary frequency present.  Clue cells noted on wet prep.  Educated patient on this finding and treatment.  Flagyl script sent to pharmacy.  Return for worsening or ongoing symptoms.      Relevant Medications   fluconazole (DIFLUCAN) 150 MG tablet   metroNIDAZOLE (FLAGYL) 500 MG tablet   Vaginal yeast infection    Noted on wet prep today.  Diflucan dose sent to lab.  UA trace blood and few bacteria, although has BV infection noted on wet prep.      Relevant Medications   fluconazole (DIFLUCAN) 150 MG tablet   metroNIDAZOLE (FLAGYL) 500 MG tablet     Other   Situational anxiety    Ongoing, stable with occasional Buspar.  She has seen improvement with this.  Continue current medication regimen and adjust as needed.  Denies SI/HI.  Return to office in 6 months.      Relevant Medications   busPIRone (BUSPAR) 5 MG tablet   Elevated LDL cholesterol level    ASCVD 5.3%.  Discussed with patient and educated.  IF LDL >190 or ASCVD 10% or greater will start statin, at this time recheck lipid panel and continue diet focus.      Relevant Orders   Lipid Panel w/o Chol/HDL Ratio   Basic metabolic panel   Obesity    BMI 35.18.  Recommended eating smaller high protein, low fat meals more  frequently and exercising 30 mins a day 5 times a week with a goal of 10-15lb weight loss in the next 3 months. Patient voiced their understanding and motivation to adhere to these recommendations.       Dizziness    Suspect related to poor diet in morning, low sugars.  Recommend eating small snack in morning prior to work or a protein shake to help maintain sugar levels.  Change positions slowly in morning due to Lisinopril.  Check BMP today.  Return as needed for worsening or ongoing symptoms.      Other Visit Diagnoses     Urinary frequency       UA obtained today   Relevant Orders   Urinalysis, Routine w reflex microscopic   WET PREP FOR TRICH, YEAST, CLUE        Follow up plan:  Return in about 6 months (around 10/15/2021) for Annual physical.

## 2021-04-14 NOTE — Assessment & Plan Note (Signed)
Chronic, ongoing, suspect recent sore throat related to allergies as has been outside doing yard work when presents.  Recommend she start taking Claritin 10 MG daily and use Flonase daily.  If any worsening or ongoing return to office.

## 2021-04-14 NOTE — Assessment & Plan Note (Signed)
With urinary frequency present.  Clue cells noted on wet prep.  Educated patient on this finding and treatment.  Flagyl script sent to pharmacy.  Return for worsening or ongoing symptoms.

## 2021-04-14 NOTE — Assessment & Plan Note (Signed)
Suspect related to poor diet in morning, low sugars.  Recommend eating small snack in morning prior to work or a protein shake to help maintain sugar levels.  Change positions slowly in morning due to Lisinopril.  Check BMP today.  Return as needed for worsening or ongoing symptoms.

## 2021-04-14 NOTE — Assessment & Plan Note (Signed)
Ongoing, stable with occasional Buspar.  She has seen improvement with this.  Continue current medication regimen and adjust as needed.  Denies SI/HI.  Return to office in 6 months. 

## 2021-04-14 NOTE — Assessment & Plan Note (Signed)
BMI 35.18.  Recommended eating smaller high protein, low fat meals more frequently and exercising 30 mins a day 5 times a week with a goal of 10-15lb weight loss in the next 3 months. Patient voiced their understanding and motivation to adhere to these recommendations.  

## 2021-04-14 NOTE — Assessment & Plan Note (Signed)
Chronic, stable with BP at goal. Continue Lisinopril 10 MG daily and adjust as needed.  Continue to monitor BP at home and document + focus on DASH diet.  BMP today. Will consider reduction to discontinuation of Lisinopril next visits if remains under good control.  Return in 6 months or sooner if any changes.

## 2021-04-14 NOTE — Assessment & Plan Note (Signed)
Noted on wet prep today.  Diflucan dose sent to lab.  UA trace blood and few bacteria, although has BV infection noted on wet prep.

## 2021-04-14 NOTE — Assessment & Plan Note (Signed)
ASCVD 5.3%.  Discussed with patient and educated.  IF LDL >190 or ASCVD 10% or greater will start statin, at this time recheck lipid panel and continue diet focus.

## 2021-04-14 NOTE — Patient Instructions (Signed)

## 2021-04-15 LAB — URINALYSIS, ROUTINE W REFLEX MICROSCOPIC
Bilirubin, UA: NEGATIVE
Glucose, UA: NEGATIVE
Ketones, UA: NEGATIVE
Leukocytes,UA: NEGATIVE
Nitrite, UA: NEGATIVE
Protein,UA: NEGATIVE
Specific Gravity, UA: >1.03 — ABNORMAL HIGH (ref 1.005–1.030)
Urobilinogen, Ur: 0.2 mg/dL (ref 0.2–1.0)
pH, UA: 5 (ref 5.0–7.5)

## 2021-04-15 LAB — LIPID PANEL W/O CHOL/HDL RATIO
Cholesterol, Total: 237 mg/dL — ABNORMAL HIGH (ref 100–199)
HDL: 35 mg/dL — ABNORMAL LOW (ref >39)
LDL Chol Calc (NIH): 135 mg/dL — ABNORMAL HIGH (ref 0–99)
Triglycerides: 368 mg/dL — ABNORMAL HIGH (ref 0–149)
VLDL Cholesterol Cal: 67 mg/dL — ABNORMAL HIGH (ref 5–40)

## 2021-04-15 LAB — BASIC METABOLIC PANEL
BUN/Creatinine Ratio: 21 (ref 9–23)
BUN: 15 mg/dL (ref 6–24)
CO2: 25 mmol/L (ref 20–29)
Calcium: 10 mg/dL (ref 8.7–10.2)
Chloride: 103 mmol/L (ref 96–106)
Creatinine, Ser: 0.73 mg/dL (ref 0.57–1.00)
Glucose: 91 mg/dL (ref 65–99)
Potassium: 4.4 mmol/L (ref 3.5–5.2)
Sodium: 141 mmol/L (ref 134–144)
eGFR: 95 mL/min/{1.73_m2} (ref >59)

## 2021-04-15 LAB — MICROSCOPIC EXAMINATION

## 2021-04-15 NOTE — Progress Notes (Signed)
Please let Stacie Bell know her labs have returned and overall remain at baseline.  Kidney and liver function are normal.  LDL, bad cholesterol level, has come down a little but still elevated.  Continue diet and exercise focus at this time.  Any questions? Keep being awesome!!  Thank you for allowing me to participate in your care.  I appreciate you. Kindest regards, Kayin Kettering

## 2021-04-22 ENCOUNTER — Telehealth: Payer: Self-pay

## 2021-04-22 NOTE — Telephone Encounter (Signed)
Copied from CRM [id number]. Topic: General - Inquiry >> Apr 22, 2021  2:25 PM Daphine Deutscher D wrote: Reason for CRM: Pt was just in last Monday for a BVi.  She has finished her medicine yesterday and says her lower abd is still hurting on the right side.    CB#  [phone]   Routing to provider to advise. Does patient need an appointment for re-evaluation?

## 2021-04-23 NOTE — Telephone Encounter (Signed)
Routing to providers in office to review schedules. Can patient he worked in sometime this week?

## 2021-04-23 NOTE — Telephone Encounter (Signed)
Pt calling back. No appointment available this week. Can this patient be worked in. Please advise.

## 2021-04-24 ENCOUNTER — Other Ambulatory Visit: Payer: Self-pay

## 2021-04-24 ENCOUNTER — Ambulatory Visit: Payer: BC Managed Care – PPO | Admitting: Nurse Practitioner

## 2021-04-24 ENCOUNTER — Encounter: Payer: Self-pay | Admitting: Nurse Practitioner

## 2021-04-24 VITALS — BP 104/70 | HR 79 | Temp 98.0°F | Ht 65.0 in | Wt 211.0 lb

## 2021-04-24 DIAGNOSIS — B3731 Acute candidiasis of vulva and vagina: Secondary | ICD-10-CM

## 2021-04-24 DIAGNOSIS — B9689 Other specified bacterial agents as the cause of diseases classified elsewhere: Secondary | ICD-10-CM

## 2021-04-24 DIAGNOSIS — B373 Candidiasis of vulva and vagina: Secondary | ICD-10-CM | POA: Diagnosis not present

## 2021-04-24 DIAGNOSIS — N760 Acute vaginitis: Secondary | ICD-10-CM | POA: Diagnosis not present

## 2021-04-24 LAB — MICROSCOPIC EXAMINATION
Bacteria, UA: NONE SEEN
WBC, UA: NONE SEEN /hpf (ref 0–5)

## 2021-04-24 LAB — URINALYSIS, ROUTINE W REFLEX MICROSCOPIC
Bilirubin, UA: NEGATIVE
Glucose, UA: NEGATIVE
Ketones, UA: NEGATIVE
Leukocytes,UA: NEGATIVE
Nitrite, UA: NEGATIVE
Protein,UA: NEGATIVE
Specific Gravity, UA: >1.03 — ABNORMAL HIGH (ref 1.005–1.030)
Urobilinogen, Ur: 0.2 mg/dL (ref 0.2–1.0)
pH, UA: 5 (ref 5.0–7.5)

## 2021-04-24 LAB — WET PREP FOR TRICH, YEAST, CLUE
Clue Cell Exam: POSITIVE — AB
Trichomonas Exam: NEGATIVE
Yeast Exam: NEGATIVE

## 2021-04-24 MED ORDER — CLINDAMYCIN HCL 300 MG PO CAPS: 300.0000 mg | ORAL_CAPSULE | Freq: Two times a day (BID) | ORAL | 0 refills | Status: DC

## 2021-04-24 NOTE — Assessment & Plan Note (Signed)
Not seen on wet prep.  Advised patient to eat yogurt or probiotic to prevent further yeast infection with antibiotic use.

## 2021-04-24 NOTE — Progress Notes (Signed)
BP 104/70   Pulse 79   Temp 98 F (36.7 C) (Oral)   Ht 5' 5"  (1.651 m)   Wt 211 lb (95.7 kg)   LMP  (LMP Unknown)   SpO2 98%   BMI 35.11 kg/m    Subjective:    Patient ID: Stacie Bell, female    DOB: Jun 08, 1961, 60 y.o.   MRN: [id number]  HPI: Stacie Bell is a 60 y.o. female  Chief Complaint  Patient presents with   Vaginitis    Feels like bladder is full and pressure on right side of lower abd.   BV    Here for a recheck, patient states symptoms are still present   VAGINAL DISCHARGE Duration:  Patient feels like it didn't clear up after the last time she was here.  Patient states she felt like she was bloated and had pain below her belly button to the right side.  Discharge description:  none   Pruritus: yes Dysuria: no Malodorous:  not since she took the initial prescription of flagyl. Urinary frequency: no but small amounts Fevers: no Abdominal pain:  yes   Treatments attempted:  flaygyl   Relevant past medical, surgical, family and social history reviewed and updated as indicated. Interim medical history since our last visit reviewed. Allergies and medications reviewed and updated.  Review of Systems  Constitutional:  Negative for fever.  Genitourinary:  Positive for decreased urine volume, frequency and pelvic pain.       Denies pruritis or foul odor.   Per HPI unless specifically indicated above     Objective:    BP 104/70   Pulse 79   Temp 98 F (36.7 C) (Oral)   Ht 5' 5"  (1.651 m)   Wt 211 lb (95.7 kg)   LMP  (LMP Unknown)   SpO2 98%   BMI 35.11 kg/m   Wt Readings from Last 3 Encounters:  04/24/21 211 lb (95.7 kg)  04/14/21 211 lb 6.4 oz (95.9 kg)  01/07/21 211 lb 3.2 oz (95.8 kg)    Physical Exam Vitals and nursing note reviewed.  Constitutional:      General: She is not in acute distress.    Appearance: Normal appearance. She is normal weight. She is not ill-appearing, toxic-appearing or diaphoretic.  HENT:     Head:  Normocephalic.     Right Ear: External ear normal.     Left Ear: External ear normal.     Nose: Nose normal.     Mouth/Throat:     Mouth: Mucous membranes are moist.     Pharynx: Oropharynx is clear.  Eyes:     General:        Right eye: No discharge.        Left eye: No discharge.     Extraocular Movements: Extraocular movements intact.     Conjunctiva/sclera: Conjunctivae normal.     Pupils: Pupils are equal, round, and reactive to light.  Cardiovascular:     Rate and Rhythm: Normal rate and regular rhythm.     Heart sounds: No murmur heard. Pulmonary:     Effort: Pulmonary effort is normal. No respiratory distress.     Breath sounds: Normal breath sounds. No wheezing or rales.  Abdominal:     General: Abdomen is flat. Bowel sounds are normal. There is no distension.     Palpations: Abdomen is soft.     Tenderness: There is abdominal tenderness in the right lower quadrant. There is no right CVA  tenderness, left CVA tenderness or guarding.  Musculoskeletal:     Cervical back: Normal range of motion and neck supple.  Skin:    General: Skin is warm and dry.     Capillary Refill: Capillary refill takes less than 2 seconds.  Neurological:     General: No focal deficit present.     Mental Status: She is alert and oriented to person, place, and time. Mental status is at baseline.  Psychiatric:        Mood and Affect: Mood normal.        Behavior: Behavior normal.        Thought Content: Thought content normal.        Judgment: Judgment normal.    Results for orders placed or performed in visit on 04/14/21  WET PREP FOR [place], YEAST, CLUE   Specimen: Sterile Swab   Sterile Swab  Result Value Ref Range   Trichomonas Exam Negative Negative   Yeast Exam Positive (A) Negative   Clue Cell Exam Positive (A) Negative  Microscopic Examination   Urine  Result Value Ref Range   WBC, UA 0-5 0 - 5 /hpf   RBC 0-2 0 - 2 /hpf   Epithelial Cells (non renal) 0-10 0 - 10 /hpf   Bacteria,  UA Few (A) None seen/Few   Yeast, UA Present (A) None seen  Urinalysis, Routine w reflex microscopic  Result Value Ref Range   Specific Gravity, UA >1.030 (H) 1.005 - 1.030   pH, UA 5.0 5.0 - 7.5   Color, UA Yellow Yellow   Appearance Ur Clear Clear   Leukocytes,UA Negative Negative   Protein,UA Negative Negative/Trace   Glucose, UA Negative Negative   Ketones, UA Negative Negative   RBC, UA Trace (A) Negative   Bilirubin, UA Negative Negative   Urobilinogen, Ur 0.2 0.2 - 1.0 mg/dL   Nitrite, UA Negative Negative   Microscopic Examination See below:   Lipid Panel w/o Chol/HDL Ratio  Result Value Ref Range   Cholesterol, Total 237 (H) 100 - 199 mg/dL   Triglycerides 368 (H) 0 - 149 mg/dL   HDL 35 (L) >39 mg/dL   VLDL Cholesterol Cal 67 (H) 5 - 40 mg/dL   LDL Chol Calc (NIH) 135 (H) 0 - 99 mg/dL  Basic metabolic panel  Result Value Ref Range   Glucose 91 65 - 99 mg/dL   BUN 15 6 - 24 mg/dL   Creatinine, Ser 0.73 0.57 - 1.00 mg/dL   eGFR 95 >59 mL/min/1.73   BUN/Creatinine Ratio 21 9 - 23   Sodium 141 134 - 144 mmol/L   Potassium 4.4 3.5 - 5.2 mmol/L   Chloride 103 96 - 106 mmol/L   CO2 25 20 - 29 mmol/L   Calcium 10.0 8.7 - 10.2 mg/dL      Assessment & Plan:   Problem List Items Addressed This Visit       Genitourinary   Bacterial vaginosis - Primary   Relevant Orders   Urinalysis, Routine w reflex microscopic   WET PREP FOR TRICH, YEAST, CLUE   Vaginal yeast infection   Relevant Orders   Urinalysis, Routine w reflex microscopic   WET PREP FOR TRICH, YEAST, CLUE     Follow up plan: No follow-ups on file.

## 2021-04-24 NOTE — Assessment & Plan Note (Signed)
Ongoing.  Clue cells noted on wet prep.  Educated patient on this finding and treatment.  Clindamycin script sent to pharmacy, patient did not tolerate Flagyl well.  Advised that if lower abdominal pain worsens she should call the office or seek higher level of care in the ER. Return for worsening or ongoing symptoms.

## 2021-05-23 ENCOUNTER — Other Ambulatory Visit: Payer: Self-pay

## 2021-05-23 ENCOUNTER — Encounter: Payer: Self-pay | Admitting: Nurse Practitioner

## 2021-05-23 ENCOUNTER — Ambulatory Visit: Payer: BC Managed Care – PPO | Admitting: Nurse Practitioner

## 2021-05-23 VITALS — BP 109/74 | HR 72 | Temp 97.8°F | Resp 18

## 2021-05-23 DIAGNOSIS — R350 Frequency of micturition: Secondary | ICD-10-CM | POA: Diagnosis not present

## 2021-05-23 DIAGNOSIS — R252 Cramp and spasm: Secondary | ICD-10-CM

## 2021-05-23 LAB — URINALYSIS, ROUTINE W REFLEX MICROSCOPIC
Bilirubin, UA: NEGATIVE
Glucose, UA: NEGATIVE
Ketones, UA: NEGATIVE
Leukocytes,UA: NEGATIVE
Nitrite, UA: NEGATIVE
Protein,UA: NEGATIVE
RBC, UA: NEGATIVE
Specific Gravity, UA: 1.01 (ref 1.005–1.030)
Urobilinogen, Ur: 0.2 mg/dL (ref 0.2–1.0)
pH, UA: 6 (ref 5.0–7.5)

## 2021-05-23 LAB — WET PREP FOR TRICH, YEAST, CLUE
Clue Cell Exam: NEGATIVE
Trichomonas Exam: NEGATIVE
Yeast Exam: NEGATIVE
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
## 2021-05-23 NOTE — Patient Instructions (Signed)
Drink plenty of fluids over the weekend Avoid caffeine and spicy foods  Benadryl as needed for cramps Stretching legs Heat/Ice can help with cramps  Call us on Monday if not improved

## 2021-05-24 ENCOUNTER — Observation Stay
Admission: EM | Admit: 2021-05-24 | Discharge: 2021-05-26 | Disposition: A | Discharge status: Home / Self Care | Payer: BC Managed Care – PPO | Attending: General Surgery | Admitting: General Surgery

## 2021-05-24 ENCOUNTER — Other Ambulatory Visit: Payer: Self-pay

## 2021-05-24 DIAGNOSIS — R1031 Right lower quadrant pain: Secondary | ICD-10-CM | POA: Diagnosis not present

## 2021-05-24 DIAGNOSIS — K353 Acute appendicitis with localized peritonitis, without perforation or gangrene: Secondary | ICD-10-CM | POA: Diagnosis not present

## 2021-05-24 DIAGNOSIS — K760 Fatty (change of) liver, not elsewhere classified: Secondary | ICD-10-CM | POA: Diagnosis not present

## 2021-05-24 DIAGNOSIS — K3580 Unspecified acute appendicitis: Secondary | ICD-10-CM | POA: Diagnosis not present

## 2021-05-24 DIAGNOSIS — Z20822 Contact with and (suspected) exposure to covid-19: Secondary | ICD-10-CM | POA: Diagnosis not present

## 2021-05-24 DIAGNOSIS — Z79899 Other long term (current) drug therapy: Secondary | ICD-10-CM | POA: Diagnosis not present

## 2021-05-24 DIAGNOSIS — I10 Essential (primary) hypertension: Secondary | ICD-10-CM | POA: Diagnosis not present

## 2021-05-24 LAB — URINALYSIS, COMPLETE (UACMP) WITH MICROSCOPIC
Bilirubin Urine: NEGATIVE
Glucose, UA: NEGATIVE mg/dL
Hgb urine dipstick: NEGATIVE
Ketones, ur: NEGATIVE mg/dL
Leukocytes,Ua: NEGATIVE
Nitrite: NEGATIVE
Protein, ur: NEGATIVE mg/dL
Specific Gravity, Urine: 1.015 (ref 1.005–1.030)
pH: 5 (ref 5.0–8.0)

## 2021-05-24 LAB — LIPASE, BLOOD: Lipase: 28 U/L (ref 11–51)

## 2021-05-24 LAB — CBC
HCT: 41.1 % (ref 36.0–46.0)
Hemoglobin: 13.8 g/dL (ref 12.0–15.0)
MCH: 30.3 pg (ref 26.0–34.0)
MCHC: 33.6 g/dL (ref 30.0–36.0)
MCV: 90.1 fL (ref 80.0–100.0)
Platelets: 226 10*3/uL (ref 150–400)
RBC: 4.56 MIL/uL (ref 3.87–5.11)
RDW: 12.7 % (ref 11.5–15.5)
WBC: 11.5 10*3/uL — ABNORMAL HIGH (ref 4.0–10.5)
nRBC: 0 % (ref 0.0–0.2)

## 2021-05-24 LAB — BASIC METABOLIC PANEL
BUN/Creatinine Ratio: 17 (ref 9–23)
BUN: 13 mg/dL (ref 6–24)
CO2: 21 mmol/L (ref 20–29)
Calcium: 9.5 mg/dL (ref 8.7–10.2)
Chloride: 102 mmol/L (ref 96–106)
Creatinine, Ser: 0.78 mg/dL (ref 0.57–1.00)
Glucose: 97 mg/dL (ref 65–99)
Potassium: 4.5 mmol/L (ref 3.5–5.2)
Sodium: 139 mmol/L (ref 134–144)
eGFR: 87 mL/min/{1.73_m2} (ref >59)

## 2021-05-24 LAB — COMPREHENSIVE METABOLIC PANEL
ALT: 15 U/L (ref 0–44)
AST: 19 U/L (ref 15–41)
Albumin: 4.4 g/dL (ref 3.5–5.0)
Alkaline Phosphatase: 55 U/L (ref 38–126)
Anion gap: 8 (ref 5–15)
BUN: 14 mg/dL (ref 6–20)
CO2: 26 mmol/L (ref 22–32)
Calcium: 9.3 mg/dL (ref 8.9–10.3)
Chloride: 105 mmol/L (ref 98–111)
Creatinine, Ser: 0.65 mg/dL (ref 0.44–1.00)
GFR, Estimated: >60 mL/min (ref >60)
Glucose, Bld: 129 mg/dL — ABNORMAL HIGH (ref 70–99)
Potassium: 3.8 mmol/L (ref 3.5–5.1)
Sodium: 139 mmol/L (ref 135–145)
Total Bilirubin: 0.5 mg/dL (ref 0.3–1.2)
Total Protein: 7.2 g/dL (ref 6.5–8.1)

## 2021-05-24 LAB — MAGNESIUM: Magnesium: 2.2 mg/dL (ref 1.6–2.3)

## 2021-05-24 MED ORDER — ONDANSETRON HCL 4 MG/2ML IJ SOLN
4.0000 mg | Freq: Once | INTRAMUSCULAR | Status: AC
  Administered 2021-05-25: 4 mg via INTRAVENOUS
  Filled 2021-05-24: qty 2

## 2021-05-24 MED ORDER — SODIUM CHLORIDE 0.9 % IV BOLUS
1000.0000 mL | Freq: Once | INTRAVENOUS | Status: AC
  Administered 2021-05-25: 1000 mL via INTRAVENOUS

## 2021-05-24 NOTE — ED Triage Notes (Signed)
Pt presents to ER c/o rlq abd pain that started last night this week.  Pt states area has become more sore tonight.  Pt states area is tender to palpation.  Pt states she is nauseous but denies vomiting and diarrhea.  Pt A&O x4 at this time.

## 2021-05-25 ENCOUNTER — Encounter
Admission: EM | Disposition: A | Discharge status: Home / Self Care | Payer: Self-pay | Source: Home / Self Care | Attending: Emergency Medicine

## 2021-05-25 ENCOUNTER — Observation Stay: Payer: BC Managed Care – PPO | Admitting: Anesthesiology

## 2021-05-25 ENCOUNTER — Emergency Department: Payer: BC Managed Care – PPO

## 2021-05-25 DIAGNOSIS — K37 Unspecified appendicitis: Secondary | ICD-10-CM | POA: Diagnosis not present

## 2021-05-25 DIAGNOSIS — K3580 Unspecified acute appendicitis: Secondary | ICD-10-CM | POA: Diagnosis present

## 2021-05-25 DIAGNOSIS — K760 Fatty (change of) liver, not elsewhere classified: Secondary | ICD-10-CM | POA: Diagnosis not present

## 2021-05-25 DIAGNOSIS — K353 Acute appendicitis with localized peritonitis, without perforation or gangrene: Secondary | ICD-10-CM | POA: Diagnosis not present

## 2021-05-25 HISTORY — PX: LAPAROSCOPIC APPENDECTOMY: SHX408

## 2021-05-25 LAB — RESP PANEL BY RT-PCR (FLU A&B, COVID) ARPGX2
Influenza A by PCR: NEGATIVE
Influenza B by PCR: NEGATIVE
SARS Coronavirus 2 by RT PCR: NEGATIVE

## 2021-05-25 IMAGING — CT CT ABD-PELV W/ CM
2 of 5 series · 15 of 46 positions shown, 17 images · IV contrast (APPLIED)
Comparison: None.

CLINICAL DATA: RLQ abdominal pain, appendicitis suspected (Age >=
14y) Abdominal pain, acute, nonlocalized

EXAM:
CT ABDOMEN AND PELVIS WITH CONTRAST
TECHNIQUE: Multidetector CT imaging of the abdomen and pelvis was performed
using the standard protocol following bolus administration of
intravenous contrast.
CONTRAST:  80mL OMNIPAQUE IOHEXOL 350 MG/ML SOLN

[Series 2: routine abd/pel with · axial · 0.98mm/px · z∈[-500,-75]mm · 12 of 96 slices shown, 14 images]
[im 6/96  soft-tissue]
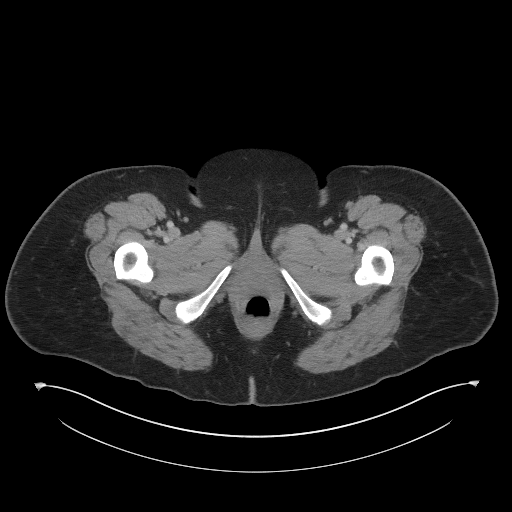
[im 6/96  bone]
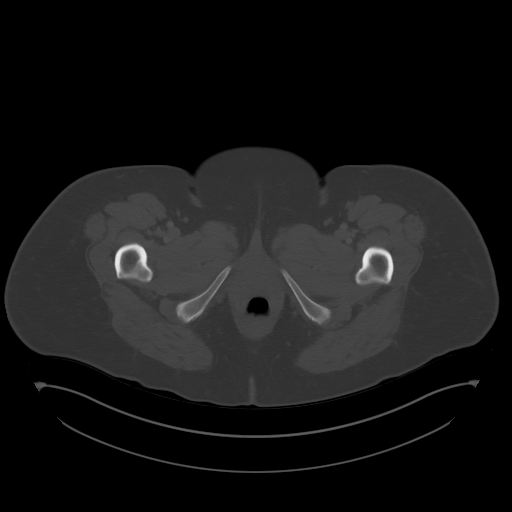
[im 16/96  soft-tissue]
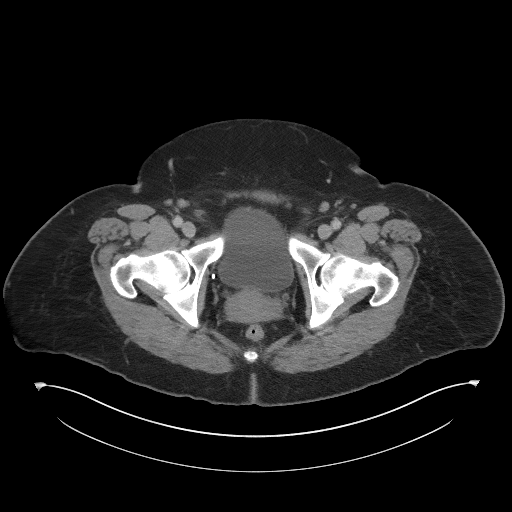
[im 21/96  soft-tissue]
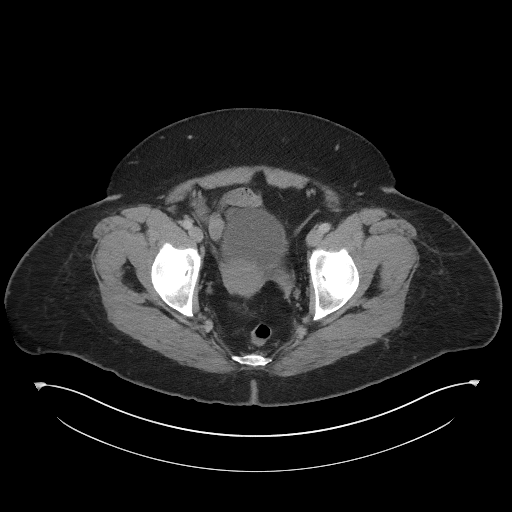
[im 31/96  soft-tissue]
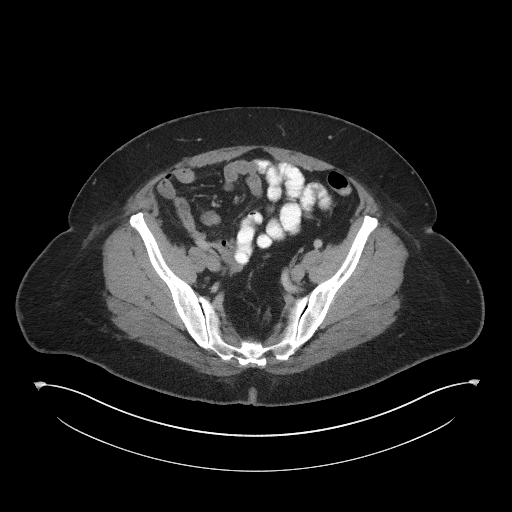
[im 36/96  soft-tissue]
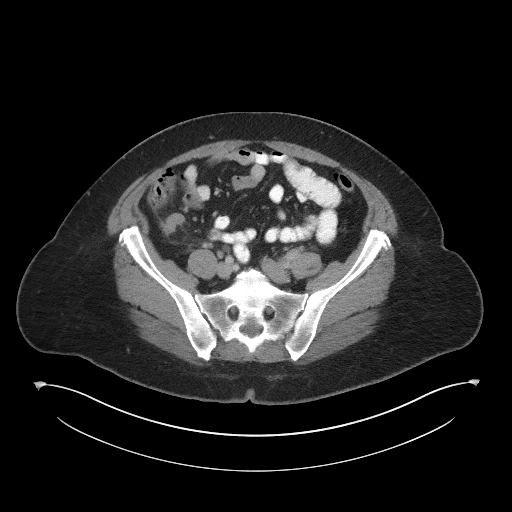
[im 46/96  soft-tissue]
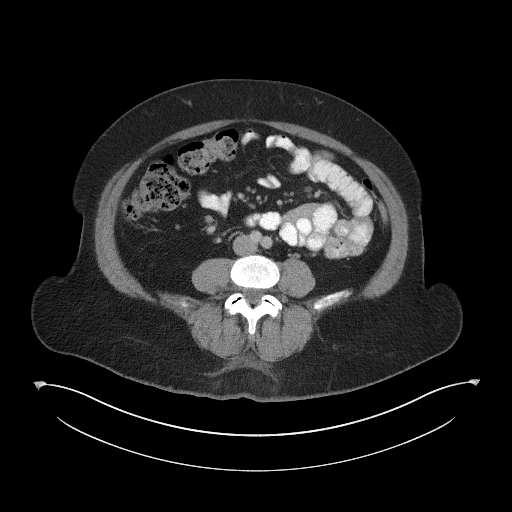
[im 51/96  soft-tissue]
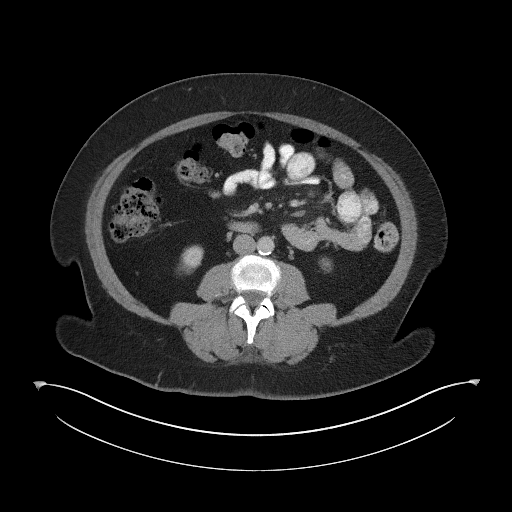
[im 61/96  soft-tissue]
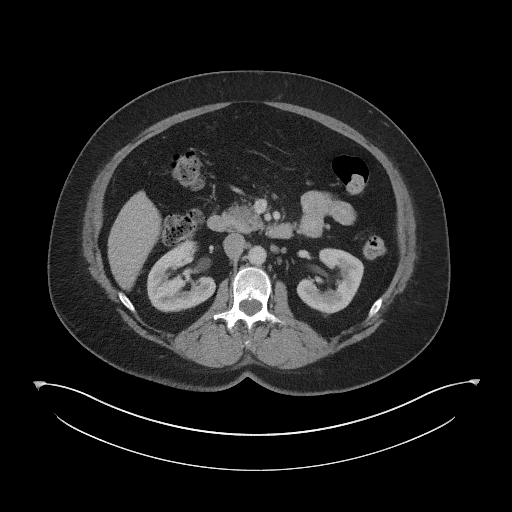
[im 66/96  soft-tissue]
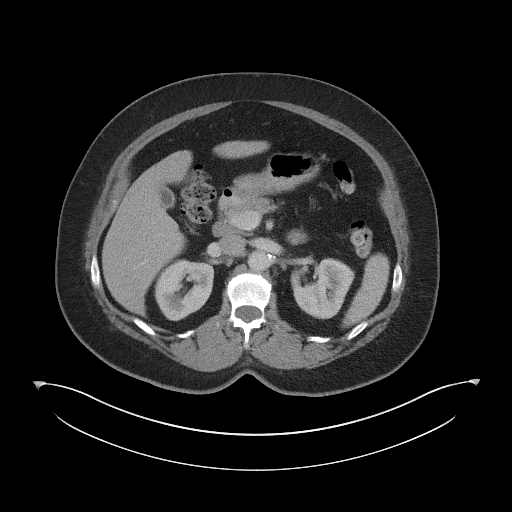
[im 66/96  bone]
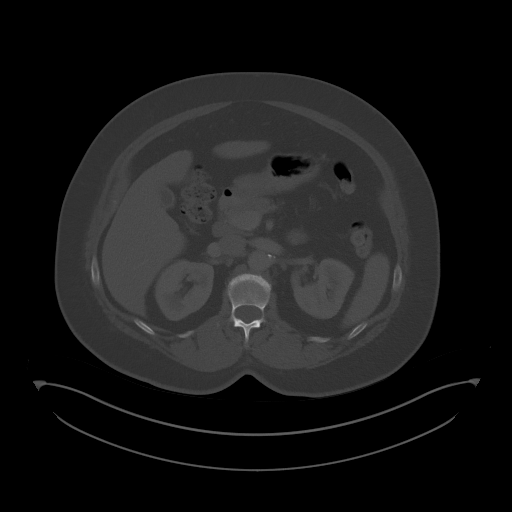
[im 76/96  soft-tissue]
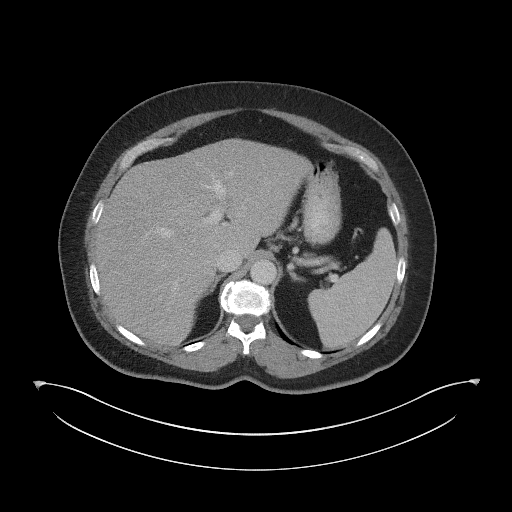
[im 81/96  soft-tissue]
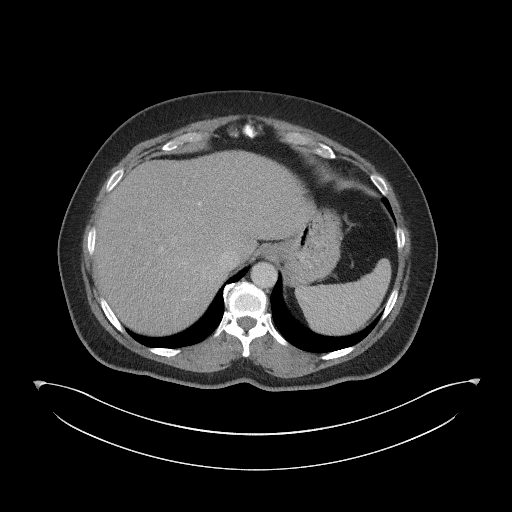
[im 91/96  soft-tissue]
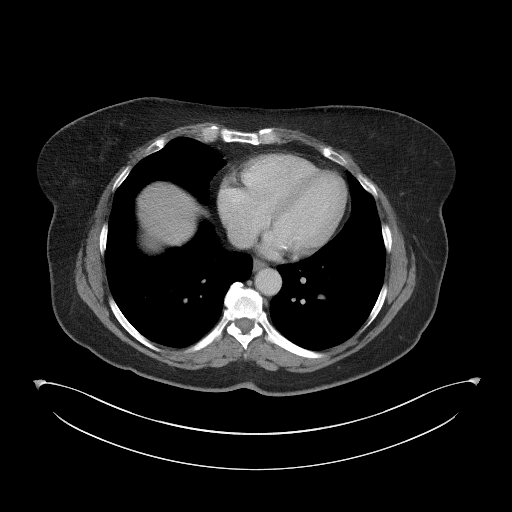

[Series 5: coronal st · coronal · 0.70mm/px · 3 of 100 slices shown]
[im 34/100  soft-tissue]
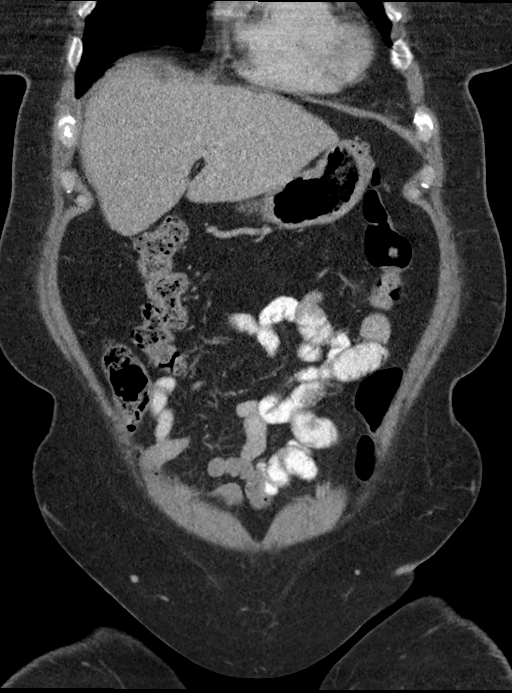
[im 45/100  soft-tissue]
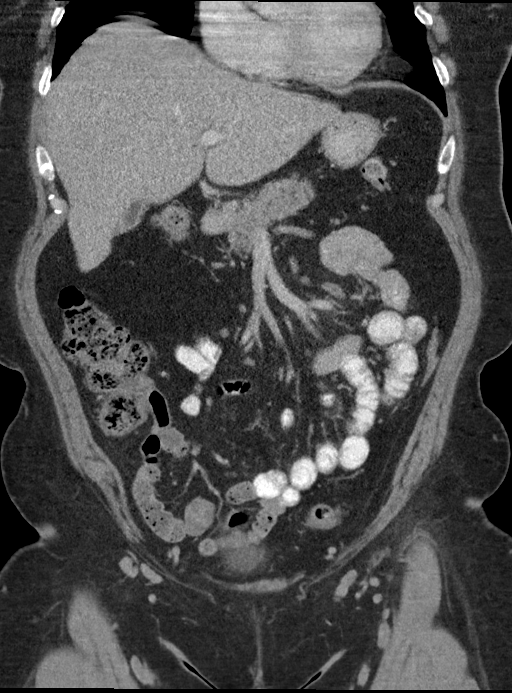
[im 56/100  soft-tissue]
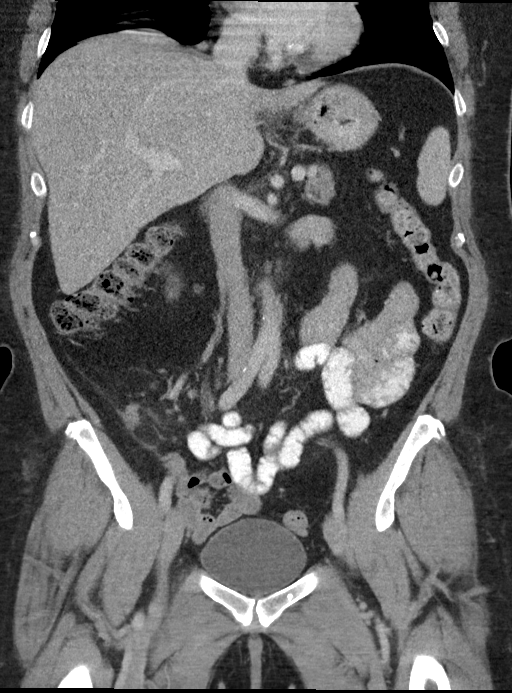

[15 of 46 positions shown; findings below may reference images not displayed]

FINDINGS: Lower chest: The lung bases are clear. No acute airspace disease or
pleural effusion.

Hepatobiliary: Small subcapsular cyst in the left lobe of the liver.
No suspicious liver lesion. There is borderline hepatic steatosis.
Decompressed gallbladder without calcified gallstone or
pericholecystic inflammation. No biliary dilatation.

Pancreas: Questionable but not definite hypoenhancing lesion in the
pancreatic head measuring 12 mm, series 2, image 33. It is unclear
if this represents a true lesion or simply focal fatty infiltration
of pancreatic parenchyma. No ductal dilatation or inflammation.

Spleen: Normal in size without focal abnormality. Small splenule
anteriorly.

Adrenals/Urinary Tract: Normal adrenal glands. No hydronephrosis or
perinephric edema. Homogeneous renal enhancement with symmetric
excretion on delayed phase imaging. No renal calculi. Subcentimeter
hypodensity in the upper left kidney is too small to characterize
but likely small cyst. Right pelvic calcification represents a
phlebolith, and is outside the course of the ureter. Urinary bladder
is physiologically distended without wall thickening.

Stomach/Bowel: Acute appendicitis as described below. Unremarkable
stomach. There is no small bowel obstruction or inflammation. Small
volume of stool in the colon.

Appendix: Location: Posteromedial to the cecum

Diameter: 9-10 mm

Appendicolith: No

Mucosal hyper-enhancement: Borderline

Extraluminal gas: No

Periappendiceal collection: No. Periappendiceal fat stranding and
trace free fluid but no organized collection.

Vascular/Lymphatic: Normal caliber abdominal aorta with mild
atherosclerosis. Patent portal and mesenteric veins. There are small
central mesenteric lymph nodes are not enlarged by size criteria,
typically reactive. Reactive ileocolic nodes.

Reproductive: Unremarkable uterus physiologic ovaries.

Other: Fat stranding in the right lower quadrant with trace free
fluid, no perforation or abscess. No free air. No upper abdominal
ascites. There is a tiny fat containing umbilical hernia.

Musculoskeletal: There are no acute or suspicious osseous
abnormalities.
IMPRESSION: 1. Uncomplicated acute appendicitis.
2. Questionable but not definite hypoenhancing lesion in the
pancreatic head measuring 12 mm. It is unclear if this represents a
true lesion or simply focal fatty infiltration of pancreatic
parenchyma. Recommend nonemergent pancreatic protocol MRI for
characterization on an elective basis after resolution of acute
event.
3. Borderline hepatic steatosis.

Aortic Atherosclerosis ([YM]-[YM]).

## 2021-05-25 SURGERY — APPENDECTOMY, LAPAROSCOPIC
Anesthesia: General | Site: Abdomen

## 2021-05-25 MED ORDER — ONDANSETRON HCL 4 MG/2ML IJ SOLN: 4.0000 mg | Freq: Four times a day (QID) | INTRAMUSCULAR | Status: DC | PRN

## 2021-05-25 MED ORDER — LIDOCAINE-EPINEPHRINE 1 %-1:100000 IJ SOLN
INTRAMUSCULAR | Status: DC | PRN
  Administered 2021-05-25: 18 mL

## 2021-05-25 MED ORDER — SUGAMMADEX SODIUM 500 MG/5ML IV SOLN
INTRAVENOUS | Status: DC | PRN
  Administered 2021-05-25: 200 mg via INTRAVENOUS

## 2021-05-25 MED ORDER — KETOROLAC TROMETHAMINE 30 MG/ML IJ SOLN
INTRAMUSCULAR | Status: AC
  Filled 2021-05-25: qty 1

## 2021-05-25 MED ORDER — DEXMEDETOMIDINE HCL IN NACL 200 MCG/50ML IV SOLN
INTRAVENOUS | Status: DC | PRN
  Administered 2021-05-25: 8 ug via INTRAVENOUS
  Administered 2021-05-25: 12 ug via INTRAVENOUS

## 2021-05-25 MED ORDER — OXYCODONE HCL 5 MG/5ML PO SOLN: 5.0000 mg | Freq: Once | ORAL | Status: AC | PRN

## 2021-05-25 MED ORDER — PROPOFOL 10 MG/ML IV BOLUS
INTRAVENOUS | Status: AC
  Filled 2021-05-25: qty 20

## 2021-05-25 MED ORDER — KETOROLAC TROMETHAMINE 30 MG/ML IJ SOLN: 30.0000 mg | Freq: Four times a day (QID) | INTRAMUSCULAR | Status: DC | PRN

## 2021-05-25 MED ORDER — ONDANSETRON HCL 4 MG/2ML IJ SOLN: 4.0000 mg | Freq: Once | INTRAMUSCULAR | Status: DC | PRN

## 2021-05-25 MED ORDER — HEMOSTATIC AGENTS (NO CHARGE) OPTIME
TOPICAL | Status: DC | PRN
Start: 2021-05-25 — End: 2021-05-25
  Administered 2021-05-25: 1 via TOPICAL

## 2021-05-25 MED ORDER — MIDAZOLAM HCL 2 MG/2ML IJ SOLN
INTRAMUSCULAR | Status: DC | PRN
  Administered 2021-05-25: 2 mg via INTRAVENOUS

## 2021-05-25 MED ORDER — ONDANSETRON 4 MG PO TBDP: 4.0000 mg | ORAL_TABLET | Freq: Four times a day (QID) | ORAL | Status: DC | PRN

## 2021-05-25 MED ORDER — LIDOCAINE HCL (PF) 2 % IJ SOLN
INTRAMUSCULAR | Status: AC
  Filled 2021-05-25: qty 5

## 2021-05-25 MED ORDER — LACTATED RINGERS IV SOLN: INTRAVENOUS | Status: DC | PRN

## 2021-05-25 MED ORDER — SUCCINYLCHOLINE CHLORIDE 200 MG/10ML IV SOSY
PREFILLED_SYRINGE | INTRAVENOUS | Status: DC | PRN
Start: 2021-05-25 — End: 2021-05-25
  Administered 2021-05-25: 120 mg via INTRAVENOUS

## 2021-05-25 MED ORDER — DEXAMETHASONE SODIUM PHOSPHATE 10 MG/ML IJ SOLN
INTRAMUSCULAR | Status: DC | PRN
  Administered 2021-05-25: 10 mg via INTRAVENOUS

## 2021-05-25 MED ORDER — ROCURONIUM BROMIDE 10 MG/ML (PF) SYRINGE
PREFILLED_SYRINGE | INTRAVENOUS | Status: AC
  Filled 2021-05-25: qty 20

## 2021-05-25 MED ORDER — SODIUM CHLORIDE 0.9 % IV SOLN
2.0000 g | INTRAVENOUS | Status: DC
  Administered 2021-05-25: 2 g via INTRAVENOUS
  Filled 2021-05-25 (×2): qty 20

## 2021-05-25 MED ORDER — FIBRIN SEALANT 2 ML SINGLE DOSE KIT
PACK | CUTANEOUS | Status: DC | PRN
  Administered 2021-05-25: 2 mL via TOPICAL

## 2021-05-25 MED ORDER — ACETAMINOPHEN 500 MG PO TABS
1000.0000 mg | ORAL_TABLET | Freq: Four times a day (QID) | ORAL | Status: DC
  Administered 2021-05-25 – 2021-05-26 (×5): 1000 mg via ORAL
  Filled 2021-05-25 (×5): qty 2

## 2021-05-25 MED ORDER — SODIUM CHLORIDE 0.9 % IR SOLN
Status: DC | PRN
  Administered 2021-05-25: 1000 mL

## 2021-05-25 MED ORDER — LIDOCAINE HCL (CARDIAC) PF 100 MG/5ML IV SOSY
PREFILLED_SYRINGE | INTRAVENOUS | Status: DC | PRN
  Administered 2021-05-25: 100 mg via INTRAVENOUS

## 2021-05-25 MED ORDER — FENTANYL CITRATE (PF) 250 MCG/5ML IJ SOLN
INTRAMUSCULAR | Status: AC
  Filled 2021-05-25: qty 5

## 2021-05-25 MED ORDER — METRONIDAZOLE 500 MG/100ML IV SOLN
500.0000 mg | Freq: Two times a day (BID) | INTRAVENOUS | Status: DC
  Administered 2021-05-25: 500 mg via INTRAVENOUS
  Filled 2021-05-25 (×2): qty 100

## 2021-05-25 MED ORDER — OXYCODONE HCL 5 MG PO TABS
5.0000 mg | ORAL_TABLET | Freq: Once | ORAL | Status: AC | PRN
Start: 2021-05-25 — End: 2021-05-25
  Administered 2021-05-25: 5 mg via ORAL

## 2021-05-25 MED ORDER — MIDAZOLAM HCL 2 MG/2ML IJ SOLN
INTRAMUSCULAR | Status: AC
  Filled 2021-05-25: qty 2

## 2021-05-25 MED ORDER — PROPOFOL 10 MG/ML IV BOLUS
INTRAVENOUS | Status: DC | PRN
  Administered 2021-05-25: 140 mg via INTRAVENOUS

## 2021-05-25 MED ORDER — PANTOPRAZOLE SODIUM 40 MG PO TBEC
40.0000 mg | DELAYED_RELEASE_TABLET | Freq: Every day | ORAL | Status: DC
  Administered 2021-05-26: 40 mg via ORAL
  Filled 2021-05-25: qty 1

## 2021-05-25 MED ORDER — DEXAMETHASONE SODIUM PHOSPHATE 10 MG/ML IJ SOLN
INTRAMUSCULAR | Status: AC
  Filled 2021-05-25: qty 1

## 2021-05-25 MED ORDER — FENTANYL CITRATE (PF) 100 MCG/2ML IJ SOLN
25.0000 ug | INTRAMUSCULAR | Status: DC | PRN
  Administered 2021-05-25: 50 ug via INTRAVENOUS

## 2021-05-25 MED ORDER — FENTANYL CITRATE (PF) 100 MCG/2ML IJ SOLN
INTRAMUSCULAR | Status: AC
  Administered 2021-05-25: 25 ug via INTRAVENOUS
  Filled 2021-05-25: qty 2

## 2021-05-25 MED ORDER — FENTANYL CITRATE (PF) 100 MCG/2ML IJ SOLN
INTRAMUSCULAR | Status: DC | PRN
  Administered 2021-05-25: 50 ug via INTRAVENOUS
  Administered 2021-05-25: 100 ug via INTRAVENOUS

## 2021-05-25 MED ORDER — SUCCINYLCHOLINE CHLORIDE 200 MG/10ML IV SOSY
PREFILLED_SYRINGE | INTRAVENOUS | Status: AC
  Filled 2021-05-25: qty 10

## 2021-05-25 MED ORDER — HYDROMORPHONE HCL 1 MG/ML IJ SOLN
0.5000 mg | INTRAMUSCULAR | Status: DC | PRN
Start: 2021-05-25 — End: 2021-05-26

## 2021-05-25 MED ORDER — IOHEXOL 350 MG/ML SOLN
80.0000 mL | Freq: Once | INTRAVENOUS | Status: AC | PRN
  Administered 2021-05-25: 80 mL via INTRAVENOUS

## 2021-05-25 MED ORDER — ROCURONIUM BROMIDE 100 MG/10ML IV SOLN
INTRAVENOUS | Status: DC | PRN
  Administered 2021-05-25: 30 mg via INTRAVENOUS

## 2021-05-25 MED ORDER — ONDANSETRON HCL 4 MG/2ML IJ SOLN
INTRAMUSCULAR | Status: AC
  Filled 2021-05-25: qty 2

## 2021-05-25 MED ORDER — OXYCODONE HCL 5 MG PO TABS
5.0000 mg | ORAL_TABLET | ORAL | Status: DC | PRN
  Administered 2021-05-25: 5 mg via ORAL
  Filled 2021-05-25: qty 1

## 2021-05-25 MED ORDER — 0.9 % SODIUM CHLORIDE (POUR BTL) OPTIME
TOPICAL | Status: DC | PRN
  Administered 2021-05-25: 500 mL

## 2021-05-25 MED ORDER — DEXTROSE IN LACTATED RINGERS 5 % IV SOLN: INTRAVENOUS | Status: DC

## 2021-05-25 MED ORDER — OXYCODONE HCL 5 MG PO TABS
ORAL_TABLET | ORAL | Status: AC
  Filled 2021-05-25: qty 1

## 2021-05-25 MED ORDER — ONDANSETRON HCL 4 MG/2ML IJ SOLN
INTRAMUSCULAR | Status: DC | PRN
  Administered 2021-05-25: 4 mg via INTRAVENOUS

## 2021-05-25 SURGICAL SUPPLY — 54 items
APPLICATOR COTTON TIP 6 STRL (MISCELLANEOUS) ×1 IMPLANT
APPLICATOR COTTON TIP 6IN STRL (MISCELLANEOUS) ×2 IMPLANT
APPLICATOR VISTASEAL 35 (MISCELLANEOUS) ×2 IMPLANT
APPLIER CLIP 5 13 M/L LIGAMAX5 (MISCELLANEOUS)
BLADE CLIPPER SURG (BLADE) IMPLANT
BLADE SURG SZ11 CARB STEEL (BLADE) ×2 IMPLANT
CHLORAPREP W/TINT 26 (MISCELLANEOUS) ×2 IMPLANT
CLIP APPLIE 5 13 M/L LIGAMAX5 (MISCELLANEOUS) IMPLANT
CUTTER FLEX LINEAR 45M (STAPLE) ×2 IMPLANT
DEFOGGER SCOPE WARMER CLEARIFY (MISCELLANEOUS) ×2 IMPLANT
DERMABOND ADVANCED (GAUZE/BANDAGES/DRESSINGS) ×1
DERMABOND ADVANCED .7 DNX12 (GAUZE/BANDAGES/DRESSINGS) ×1 IMPLANT
ELECT CAUTERY BLADE TIP 2.5 (TIP) ×2
ELECT REM PT RETURN 9FT ADLT (ELECTROSURGICAL) ×2
ELECTRODE CAUTERY BLDE TIP 2.5 (TIP) ×1 IMPLANT
ELECTRODE REM PT RTRN 9FT ADLT (ELECTROSURGICAL) ×1 IMPLANT
GAUZE 4X4 16PLY ~~LOC~~+RFID DBL (SPONGE) ×2 IMPLANT
GLOVE SURG SYN 6.5 ES PF (GLOVE) ×2 IMPLANT
GLOVE SURG UNDER LTX SZ7 (GLOVE) ×2 IMPLANT
GOWN STRL REUS W/ TWL LRG LVL3 (GOWN DISPOSABLE) ×2 IMPLANT
GOWN STRL REUS W/TWL LRG LVL3 (GOWN DISPOSABLE) ×2
GRASPER SUT TROCAR 14GX15 (MISCELLANEOUS) ×2 IMPLANT
HEMOSTAT SURGICEL 2X3 (HEMOSTASIS) ×2 IMPLANT
IRRIGATION STRYKERFLOW (MISCELLANEOUS) ×1 IMPLANT
IRRIGATOR STRYKERFLOW (MISCELLANEOUS) ×2
IV NS 1000ML (IV SOLUTION) ×1
IV NS 1000ML BAXH (IV SOLUTION) ×1 IMPLANT
KIT TURNOVER KIT A (KITS) ×2 IMPLANT
KITTNER LAPARASCOPIC 5X40 (MISCELLANEOUS) ×2 IMPLANT
LABEL OR SOLS (LABEL) ×2 IMPLANT
MANIFOLD NEPTUNE II (INSTRUMENTS) ×2 IMPLANT
NEEDLE HYPO 22GX1.5 SAFETY (NEEDLE) ×2 IMPLANT
NS IRRIG 500ML POUR BTL (IV SOLUTION) ×4 IMPLANT
PACK LAP CHOLECYSTECTOMY (MISCELLANEOUS) ×2 IMPLANT
PENCIL ELECTRO HAND CTR (MISCELLANEOUS) ×2 IMPLANT
POUCH SPECIMEN RETRIEVAL 10MM (ENDOMECHANICALS) ×2 IMPLANT
RELOAD STAPLE TA45 3.5 REG BLU (ENDOMECHANICALS) ×2 IMPLANT
SCISSORS METZENBAUM CVD 33 (INSTRUMENTS) ×2 IMPLANT
SET TUBE SMOKE EVAC HIGH FLOW (TUBING) ×2 IMPLANT
SHEARS HARMONIC ACE PLUS 36CM (ENDOMECHANICALS) ×2 IMPLANT
SLEEVE ADV FIXATION 5X100MM (TROCAR) ×2 IMPLANT
STRIP CLOSURE SKIN 1/2X4 (GAUZE/BANDAGES/DRESSINGS) ×2 IMPLANT
SUT MNCRL 4-0 (SUTURE) ×1
SUT MNCRL 4-0 27XMFL (SUTURE) ×1
SUT VIC AB 3-0 SH 27 (SUTURE) ×1
SUT VIC AB 3-0 SH 27X BRD (SUTURE) ×1 IMPLANT
SUT VICRYL 0 AB UR-6 (SUTURE) ×2 IMPLANT
SUTURE MNCRL 4-0 27XMF (SUTURE) ×1 IMPLANT
SYS KII FIOS ACCESS ABD 5X100 (TROCAR) ×2
SYSTEM KII FIOS ACES ABD 5X100 (TROCAR) ×1 IMPLANT
TRAY FOLEY MTR SLVR 16FR STAT (SET/KITS/TRAYS/PACK) ×2 IMPLANT
TROCAR ADV FIXATION 12X100MM (TROCAR) ×2 IMPLANT
TROCAR BALLN GELPORT 12X130M (ENDOMECHANICALS) ×2 IMPLANT
WATER STERILE IRR 500ML POUR (IV SOLUTION) ×2 IMPLANT
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
## 2021-05-25 NOTE — Anesthesia Procedure Notes (Signed)
Procedure Name: Intubation Date/Time: 05/25/2021 10:49 AM Performed by: Katherine Basset, CRNA Pre-anesthesia Checklist: Patient identified, Emergency Drugs available, Suction available and Patient being monitored Patient Re-evaluated:Patient Re-evaluated prior to induction Oxygen Delivery Method: Circle system utilized Preoxygenation: Pre-oxygenation with 100% oxygen Induction Type: IV induction Ventilation: Mask ventilation without difficulty Laryngoscope Size: Miller and 2 Grade View: Grade I Tube type: Oral Tube size: 6.5 mm Number of attempts: 1 Airway Equipment and Method: Stylet and Oral airway Placement Confirmation: ETT inserted through vocal cords under direct vision, positive ETCO2 and breath sounds checked- equal and bilateral Secured at: 21 cm Tube secured with: Tape Dental Injury: Teeth and Oropharynx as per pre-operative assessment

## 2021-05-25 NOTE — Anesthesia Postprocedure Evaluation (Signed)
Anesthesia Post Note  Patient: Stacie [name]  Procedure(s) Performed: APPENDECTOMY LAPAROSCOPIC (Abdomen)  Patient location during evaluation: PACU Anesthesia Type: General Level of consciousness: awake and alert Pain management: pain level controlled Vital Signs Assessment: post-procedure vital signs reviewed and stable Respiratory status: spontaneous breathing, nonlabored ventilation, respiratory function stable and patient connected to nasal cannula oxygen Cardiovascular status: blood pressure returned to baseline and stable Postop Assessment: [name] apparent nausea or vomiting Anesthetic complications: [name]   [name] notable events documented.   Last Vitals:  Vitals:   05/25/21 1215 05/25/21 1230  BP: 113/69 116/76  Pulse: 73 62  Resp: 19 10  Temp: (!) 36.3 C   SpO2: 93% 93%    Last Pain:  Vitals:   05/25/21 1230  TempSrc:   PainSc: 4                  Corinda Gubler

## 2021-05-25 NOTE — ED Notes (Signed)
Pt ambulatory to bathroom, able to void sans complications.

## 2021-05-25 NOTE — Transfer of Care (Signed)
Immediate Anesthesia Transfer of Care Note  Patient: Stacie [name]  Procedure(s) Performed: APPENDECTOMY LAPAROSCOPIC (Abdomen)  Patient Location: PACU  Anesthesia Type:General  Level of Consciousness: awake, alert  and oriented  Airway & Oxygen Therapy: Patient Spontanous Breathing and Patient connected to face mask oxygen  Post-op Assessment: Report given to RN, Post -op Vital signs reviewed and stable and Patient moving all extremities  Post vital signs: Reviewed and stable  Last Vitals:  Vitals Value Taken Time  BP 116/73 05/25/21 1152  Temp 36.3 C 05/25/21 1152  Pulse 62 05/25/21 1156  Resp 17 05/25/21 1156  SpO2 98 % 05/25/21 1156  Vitals shown include unvalidated device data.  Last Pain:  Vitals:   05/25/21 0813  TempSrc: Oral  PainSc:          Complications: No notable events documented.

## 2021-05-25 NOTE — Plan of Care (Signed)
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
## 2021-05-25 NOTE — Op Note (Signed)
Operative Note  Laparoscopic Appendectomy   Stacie [name] Date of operation:  05/25/2021  Indications: The patient presented with a history of  abdominal pain. Workup has revealed findings consistent with acute appendicitis.  Pre-operative Diagnosis: Acute appendicitis without mention of peritonitis  Post-operative Diagnosis: Acute appendicitis without mention of peritonitis  Surgeon: Duanne Guess, MD  Anesthesia: GETA  Findings: Acutely inflamed appendix without gangrene, perforation, or abscess.  Estimated Blood Loss: Less than 5 cc         Specimens: appendix         Complications: None immediately apparent  Procedure Details  The patient was seen again in the preop area. The options of surgery versus observation were reviewed with the patient and/or family. The risks of bleeding, infection, recurrence of symptoms, negative laparoscopy, potential for an open procedure, bowel injury, abscess or infection, were all reviewed as well. The patient was taken to Operating Room, identified as Stacie [name] and the procedure verified as laparoscopic appendectomy. A time out was performed and the above information confirmed.  The patient was placed in the supine position and general anesthesia was induced.  Antibiotic prophylaxis was administered and VTE prophylaxis was in place. A Foley catheter was placed by the nursing staff.   The abdomen was prepped and draped in a sterile fashion.  The abdomen was entered via Optiview technique at Palmer's point in the left upper quadrant.  A 10 mm port was placed just below the umbilicus and 2 5 mm working ports were also placed.  All port sites were infiltrated with a local anesthetic prior to making incision.  The appendix was identified and found to be acutely inflamed and retrocecal.  No perforation, gangrene or abscess was identified. The appendix was carefully dissected away from the surrounding tissues.  The mesoappendix was divided with  the harmonic scalpel. The base of the appendix was dissected out and divided with a standard load Endo GIA.The appendix was placed in a Endo Catch bag and removed via the 10 mm port. The right lower quadrant and pelvis was then irrigated with normal saline which was then aspirated.  There was a small amount of oozing from the staple line which was treated with Surgicel and Vistaseal.  The right lower quadrant was inspected once again and there was no sign of bleeding or bowel injury therefore pneumoperitoneum was released, all ports were removed.  The umbilical fascia was closed with 0 Vicryl interrupted sutures and the skin incisions were approximated with subcuticular 4-0 Monocryl. Dermabond was applied followed by Steri-Strips The patient tolerated the procedure well and there were no immediately apparent complications. The sponge lap and needle count were correct at the end of the procedure.  The patient was taken to the recovery room in stable condition to be admitted for continued care.   Duanne Guess, MD, FACS

## 2021-05-26 ENCOUNTER — Ambulatory Visit: Payer: BC Managed Care – PPO | Admitting: Nurse Practitioner

## 2021-05-26 ENCOUNTER — Encounter: Payer: Self-pay | Admitting: General Surgery

## 2021-05-26 MED ORDER — IBUPROFEN 800 MG PO TABS: 800.0000 mg | ORAL_TABLET | Freq: Three times a day (TID) | ORAL | 0 refills | Status: DC | PRN

## 2021-05-26 MED ORDER — OXYCODONE HCL 5 MG PO TABS: 5.0000 mg | ORAL_TABLET | ORAL | 0 refills | Status: DC | PRN

## 2021-05-26 MED ORDER — ACETAMINOPHEN 500 MG PO TABS: 1000.0000 mg | ORAL_TABLET | Freq: Four times a day (QID) | ORAL | 0 refills | Status: AC

## 2021-05-26 NOTE — Discharge Summary (Signed)
Physician Discharge Summary  Patient ID: Stacie Bell MRN: [id number] DOB/AGE: July 06, 1961 60 y.o.  Admit date: 05/24/2021 Discharge date: 05/26/2021  Admission Diagnoses: Acute appendicitis  Discharge Diagnoses:  Active Problems:   Acute appendicitis Status post laparoscopic appendectomy  Discharged Condition: good  Hospital Course: The patient presented to the emergency department on the evening of May 24, 2021 with right lower quadrant pain.  Emergency department evaluation was consistent with acute appendicitis.  She was taken to the operating room the following day for an uncomplicated laparoscopic appendectomy.  She was monitored overnight.  Pain was well controlled with oral pain medications and she was tolerating a diet.  She was felt suitable for discharge home.  Consults: None  Significant Diagnostic Studies: labs: Notable for a mild leukocytosis and radiology: CT scan: Consistent with acute uncomplicated appendicitis  Treatments: IV hydration, antibiotics: ceftriaxone and metronidazole, analgesia: Multimodal analgesia, and surgery: Laparoscopic appendectomy  Discharge Exam: Blood pressure 108/71, pulse 64, temperature 98.1 F (36.7 C), temperature source Oral, resp. rate 18, height 5\' 5"  (1.651 m), weight 94.8 kg, SpO2 96 %. General appearance: alert, cooperative, and no distress Resp: Normal work of breathing on room air GI: Soft, appropriately tender for postsurgical state Incision/Wound: Laparoscopic incisions covered with Steri-Strips.  No erythema, induration, or drainage present.  Disposition: Discharge disposition: 01-Home or Self Care       Discharge Instructions     Call MD for:  difficulty breathing, headache or visual disturbances   Complete by: As directed    Call MD for:  persistant dizziness or light-headedness   Complete by: As directed    Call MD for:  persistant nausea and vomiting   Complete by: As directed    Call MD for:  redness,  tenderness, or signs of infection (pain, swelling, redness, odor or green/yellow discharge around incision site)   Complete by: As directed    Call MD for:  severe uncontrolled pain   Complete by: As directed    Call MD for:  temperature >100.4   Complete by: As directed    Diet - low sodium heart healthy   Complete by: As directed    Discharge wound care:   Complete by: As directed    You may shower starting tomorrow.  Do not submerge the incisions in water of any kind or allow them to become saturated with water or sweat.  It is okay if they get a little damp in the shower; just pat them dry gently with a clean soft towel.  Allow the Steri-Strips (paper tapes) to fall off on their own.   Driving Restrictions   Complete by: As directed    No driving for 1 week or while taking narcotic pain medication.   Increase activity slowly   Complete by: As directed    Lifting restrictions   Complete by: As directed    Do not lift, push, or pull anything heavier than 10 pounds for 3 weeks.      Allergies as of 05/26/2021       Reactions   Biaxin [clarithromycin] Other (See Comments)   whelps in head    Doxycycline Other (See Comments)   itching   Levaquin [levofloxacin] Itching   Nitrofuran Derivatives Itching, Other (See Comments)   Chest heaviness   Penicillins Itching   Tolerated Cephalosporin Date: 05/25/21.     Baclofen Palpitations   Heart fluttering/anxious        Medication List     TAKE these medications  acetaminophen 500 MG tablet Commonly known as: TYLENOL Take 2 tablets (1,000 mg total) by mouth every 6 (six) hours.   aspirin EC 81 MG tablet Take 81 mg by mouth daily.   BIOTIN PO Take by mouth daily.   busPIRone 5 MG tablet Commonly known as: BUSPAR Take 5 mg by mouth as needed.   clotrimazole-betamethasone cream Commonly known as: LOTRISONE Apply externally BID prn sx up to 2 wks   FISH OIL PO Take by mouth daily.   ibuprofen 800 MG  tablet Commonly known as: ADVIL Take 1 tablet (800 mg total) by mouth every 8 (eight) hours as needed.   lisinopril 10 MG tablet Commonly known as: ZESTRIL Take 1 tablet (10 mg total) by mouth daily.   multivitamin capsule Take 1 capsule by mouth daily.   oxyCODONE 5 MG immediate release tablet Commonly known as: Oxy IR/ROXICODONE Take 1 tablet (5 mg total) by mouth every 4 (four) hours as needed for moderate pain.   pantoprazole 40 MG tablet Commonly known as: PROTONIX TAKE 1 TABLET BY MOUTH DAILY BEFORE BREAKFAST What changed:  how to take this when to take this reasons to take this   VITAMIN B 12 PO Take by mouth daily.               Discharge Care Instructions  (From admission, onward)           Start     Ordered   05/26/21 0000  Discharge wound care:       Comments: You may shower starting tomorrow.  Do not submerge the incisions in water of any kind or allow them to become saturated with water or sweat.  It is okay if they get a little damp in the shower; just pat them dry gently with a clean soft towel.  Allow the Steri-Strips (paper tapes) to fall off on their own.   05/26/21 1238            Follow-up Information     Donovan Kail, PA-C. Schedule an appointment as soon as possible for a visit in 2 week(s).   Specialty: Physician Assistant Contact information: [address] [place] [address] [phone]                 Signed: Duanne Guess 05/26/2021, 12:39 PM

## 2021-05-27 ENCOUNTER — Telehealth: Payer: Self-pay | Admitting: General Surgery

## 2021-05-27 LAB — SURGICAL PATHOLOGY

## 2021-05-27 NOTE — Telephone Encounter (Signed)
Patient called to schedule her post op.  She does have some questions though from her surgery.  She had lap appendectomy done on 05/25/21 with Dr. Lady Gary.  Patient states she has two incisions and is wondering why there is an incision under her left breast? Also patient states that she has patches at the incisional sites and wants to know if they will fall off or if she is to pull these off?  Please call her.  Thank you.

## 2021-05-27 NOTE — Telephone Encounter (Signed)
Patient notified that the site was either a camera port or where they inflated her abdomen. She was also notified that the steri strips will fall off on their own in 1-2 weeks.

## 2021-05-28 LAB — URINE CULTURE

## 2021-06-09 ENCOUNTER — Telehealth: Payer: Self-pay | Admitting: *Deleted

## 2021-06-09 NOTE — Telephone Encounter (Signed)
Faxed FMLA to [employer] at [phone]

## 2021-06-11 ENCOUNTER — Ambulatory Visit (INDEPENDENT_AMBULATORY_CARE_PROVIDER_SITE_OTHER): Payer: BC Managed Care – PPO | Admitting: Physician Assistant

## 2021-06-11 ENCOUNTER — Encounter: Payer: Self-pay | Admitting: Physician Assistant

## 2021-06-11 ENCOUNTER — Other Ambulatory Visit: Payer: Self-pay

## 2021-06-11 VITALS — BP 108/75 | HR 79 | Temp 98.4°F | Ht 65.0 in | Wt 207.2 lb

## 2021-06-11 DIAGNOSIS — Z09 Encounter for follow-up examination after completed treatment for conditions other than malignant neoplasm: Secondary | ICD-10-CM

## 2021-06-11 DIAGNOSIS — K353 Acute appendicitis with localized peritonitis, without perforation or gangrene: Secondary | ICD-10-CM

## 2021-06-11 NOTE — Patient Instructions (Signed)
If you have any concerns or questions, please feel free to call our office. Follow up as needed.    GENERAL POST-OPERATIVE PATIENT INSTRUCTIONS   WOUND CARE INSTRUCTIONS:  Keep a dry clean dressing on the wound if there is drainage. The initial bandage may be removed after 24 hours.  Once the wound has quit draining you may leave it open to air.  If clothing rubs against the wound or causes irritation and the wound is not draining you may cover it with a dry dressing during the daytime.  Try to keep the wound dry and avoid ointments on the wound unless directed to do so.  If the wound becomes bright red and painful or starts to drain infected material that is not clear, please contact your physician immediately.  If the wound is mildly pink and has a thick firm ridge underneath it, this is normal, and is referred to as a healing ridge.  This will resolve over the next 4-6 weeks.  BATHING: You may shower if you have been informed of this by your surgeon. However, Please do not submerge in a tub, hot tub, or pool until incisions are completely sealed or have been told by your surgeon that you may do so.  DIET:  You may eat any foods that you can tolerate.  It is a good idea to eat a high fiber diet and take in plenty of fluids to prevent constipation.  If you do become constipated you may want to take a mild laxative or take ducolax tablets on a daily basis until your bowel habits are regular.  Constipation can be very uncomfortable, along with straining, after recent surgery.  ACTIVITY:  You are encouraged to cough and deep breath or use your incentive spirometer if you were given one, every 15-30 minutes when awake.  This will help prevent respiratory complications and low grade fevers post-operatively if you had a general anesthetic.  You may want to hug a pillow when coughing and sneezing to add additional support to the surgical area, if you had abdominal or chest surgery, which will decrease pain  during these times.  You are encouraged to walk and engage in light activity for the next two weeks.  You should not lift more than 10-15 pounds, until 06/22/2021 as it could put you at increased risk for complications.  Twenty pounds is roughly equivalent to a plastic bag of groceries. At that time- Listen to your body when lifting, if you have pain when lifting, stop and then try again in a few days. Soreness after doing exercises or activities of daily living is normal as you get back in to your normal routine.  MEDICATIONS:  Try to take narcotic medications and anti-inflammatory medications, such as tylenol, ibuprofen, naprosyn, etc., with food.  This will minimize stomach upset from the medication.  Should you develop nausea and vomiting from the pain medication, or develop a rash, please discontinue the medication and contact your physician.  You should not drive, make important decisions, or operate machinery when taking narcotic pain medication.  SUNBLOCK Use sun block to incision area over the next year if this area will be exposed to sun. This helps decrease scarring and will allow you avoid a permanent darkened area over your incision.   Fiber Content in Foods  Fiber is a substance that is found in plant foods, such as fruits, vegetables, whole grains, nuts, seeds, and beans. As part of your treatment and recovery plan, your health care  provider may recommend that you eat foods that have specific amounts of dietary fiber. Some conditions may require a high-fiber diet while others may require a low-fiber diet. This sheet gives you information about the dietary fiber content of some common foods. Your health care provider will tell you how much fiber you need in your diet. If you have problems or questions, contact your health care provider or dietitian. What foods are high in fiber? Fruits Blackberries or raspberries (fresh) --  cup (75 g) has 4 g of fiber. Pear (fresh) -- 1 medium (180 g)  has 5.5 g of fiber. Prunes (dried) -- 6 to 8 pieces (57-76 g) has 5 g of fiber. Apple with skin -- 1 medium (182 g) has 4.8 g of fiber. Guava -- 1 cup (128 g) has 8.9 g of fiber. Vegetables Peas (frozen) --  cup (80 g) has 4.4 g of fiber. Potato with skin (baked) -- 1 medium (173 g) has 4.4 g of fiber. Pumpkin (canned) --  cup (122 g) has 5 g of fiber. Brussels sprouts (cooked) --  cup (78 g) has 4 g of fiber. Sweet potato --  cup mashed (124 g) has 4 g of fiber. Winter squash -- 1 cup cooked (205 g) has 5.7 g of fiber. Grains Bran cereal --  cup (31 g) has 8.6 g of fiber. Bulgur (cooked) --  cup (70 g) has 4 g of fiber. Quinoa (cooked) -- 1 cup (185 g) has 5.2 g of fiber. Popcorn -- 3 cups (375 g) popped has 5.8 g of fiber. Spaghetti, whole wheat -- 1 cup (140 g) has 6 g of fiber. Meats and other proteins Pinto beans (cooked) --  cup (90 g) has 7.7 g of fiber. Lentils (cooked) --  cup (90 g) has 7.8 g of fiber. Kidney beans (canned) --  cup (92.5 g) has 5.7 g of fiber. Soybeans (canned, frozen, or fresh) --  cup (92.5 g) has 5.2 g of fiber. Baked beans, plain or vegetarian (canned) --  cup (130 g) has 5.2 g of fiber. Garbanzo beans or chickpeas (canned) --  cup (90 g) has 6.6 g of fiber. Black beans (cooked) --  cup (86 g) has 7.5 g of fiber. White beans or navy beans (cooked) --  cup (91 g) has 9.3 g of fiber. The items listed above may not be a complete list of foods with high fiber. Actual amounts of fiber may be different depending on processing. Contact a dietitian for more information. What foods are moderate in fiber? Fruits Banana -- 1 medium (126 g) has 3.2 g of fiber. Melon -- 1 cup (155 g) has 1.4 g of fiber. Orange -- 1 small (154 g) has 3.7 g of fiber. Raisins --  cup (40 g) has 1.8 g of fiber. Applesauce, sweetened --  cup (125 g) has 1.5 g of fiber. Blueberries (fresh) --  cup (75 g) has 1.8 g of fiber. Strawberries (fresh, sliced) -- 1 cup (150 g)  has 3 g of fiber. Cherries -- 1 cup (140 g) has 2.9 g of fiber. Vegetables Broccoli (cooked) --  cup (77.5 g) has 2.1 g of fiber. Carrots (cooked) --  cup (77.5 g) has 2.2 g of fiber. Corn (canned or frozen) --  cup (82.5 g) has 2.1 g of fiber. Potatoes, mashed --  cup (105 g) has 1.6 g of fiber. Tomato -- 1 medium (62 g) has 1.5 g of fiber. Green beans (canned) --  cup (83 g)  has 2 g of fiber. Squash, winter --  cup (58 g) has 1 g of fiber. Sweet potato, baked -- 1 medium (150 g) has 3 g of fiber. Cauliflower (cooked) -- 1/2 cup (90 g) has 2.3 g of fiber. Grains Long-grain brown rice (cooked) -- 1 cup (196 g) has 3.5 g of fiber. Bagel, plain -- one 4-inch (10 cm) bagel has 2 g of fiber. Instant oatmeal --  cup (120 g) has about 2 g of fiber. Macaroni noodles, enriched (cooked) -- 1 cup (140 g) has 2.5 g of fiber. Multigrain cereal --  cup (15 g) has about 2-4 g of fiber. Whole-wheat bread -- 1 slice (26 g) has 2 g of fiber. Whole-wheat spaghetti noodles --  cup (70 g) has 3.2 g of fiber. Corn tortilla -- one 6-inch (15 cm) tortilla has 1.5 g of fiber. Meats and other proteins Almonds --  cup or 1 oz (28 g) has 3.5 g of fiber. Sunflower seeds in shell --  cup or  oz (11.5 g) has 1.1 g of fiber. Vegetable or soy patty -- 1 patty (70 g) has 3.4 g of fiber. Walnuts --  cup or 1 oz (30 g) has 2 g of fiber. Flax seed -- 1 Tbsp (7 g) has 2.8 g of fiber. The items listed above may not be a complete list of foods that have moderate amounts of fiber. Actual amounts of fiber may be different depending on processing. Contact a dietitian for more information. What foods are low in fiber? Low-fiber foods contain less than 1 g of fiber per serving. They include: Fruits Fruit juice --  cup or 4 fl oz (118 mL) has 0.5 g of fiber. Vegetables Lettuce -- 1 cup (35 g) has 0.5 g of fiber. Cucumber (slices) --  cup (60 g) has 0.3 g of fiber. Celery -- 1 stalk (40 g) has 0.1 g of  fiber. Grains Flour tortilla -- one 6-inch (15 cm) tortilla has 0.5 g of fiber. White rice (cooked) --  cup (81.5 g) has 0.3 g of fiber. Meats and other proteins Egg -- 1 large (50 g) has 0 g of fiber. Meat, poultry, or fish -- 3 oz (85 g) has 0 g of fiber. Dairy Milk -- 1 cup or 8 fl oz (237 mL) has 0 g of fiber. Yogurt -- 1 cup (245 g) has 0 g of fiber. The items listed above may not be a complete list of foods that are low in fiber. Actual amounts of fiber may be different depending on processing. Contact a dietitian for more information. Summary Fiber is a substance that is found in plant foods, such as fruits, vegetables, whole grains, nuts, seeds, and beans. As part of your treatment and recovery plan, your health care provider may recommend that you eat foods that have specific amounts of dietary fiber. This information is not intended to replace advice given to you by your health care provider. Make sure you discuss any questions you have with your health care provider. Document Revised: 12/21/2019 Document Reviewed: 12/21/2019 Elsevier Patient Education  2022 [employer].

## 2021-06-11 NOTE — Progress Notes (Signed)
[facility] POST-OP OFFICE VISIT  06/11/2021  HPI: Stacie Bell is a 60 y.o. female 17 days s/p robotic assisted laparoscopic appendectomy for acute appendicitis with Dr Lady Gary  She has done well since surgery. Initially with constipation but that resolved. No fever, chills, nausea, emesis or abdominal pain. No problems with the incisions. No issues with PO intake. Overall doing well  Vital signs: BP 108/75   Pulse 79   Temp 98.4 F (36.9 C) (Oral)   Ht 5\' 5"  (1.651 m)   Wt 207 lb 3.2 oz (94 kg)   LMP  (LMP Unknown)   SpO2 94%   BMI 34.48 kg/m    Physical Exam: Constitutional: Well appearing female, NAD Abdomen: Soft, non-tender, non-distended, no rebound/guarding Skin: Laparoscopic incisions are well healed, no erythema or drainage   Assessment/Plan: This is a 60 y.o. female 17 days s/p robotic assisted laparoscopic appendectomy for acute appendicitis    - Pain control prn  - Reviewed wound care  - Reviewed lifting restrictions; updated work note  - Reviewed surgical pathology: Acute appendicitis, negative for malignancy  - She can rtc on as needed basis  - I also spent time reviewing her CT scan from admission at her request given finding of possible pancreatic lesion. Encouraged her to reach out to her PCP to obtain MRI.    -- 46, PA-C [facility] 06/11/2021, 11:11 AM [phone] M-F: 7am - 4pm

## 2021-06-24 ENCOUNTER — Encounter (HOSPITAL_COMMUNITY): Payer: Self-pay | Admitting: Radiology

## 2021-06-26 ENCOUNTER — Encounter: Payer: Self-pay | Admitting: Nurse Practitioner

## 2021-06-26 ENCOUNTER — Telehealth: Payer: Self-pay | Admitting: Nurse Practitioner

## 2021-06-26 ENCOUNTER — Telehealth (INDEPENDENT_AMBULATORY_CARE_PROVIDER_SITE_OTHER): Payer: BC Managed Care – PPO | Admitting: Nurse Practitioner

## 2021-06-26 VITALS — BP 107/64

## 2021-06-26 DIAGNOSIS — U071 COVID-19: Secondary | ICD-10-CM

## 2021-06-26 DIAGNOSIS — K869 Disease of pancreas, unspecified: Secondary | ICD-10-CM | POA: Insufficient documentation

## 2021-06-26 DIAGNOSIS — Z8616 Personal history of COVID-19: Secondary | ICD-10-CM | POA: Insufficient documentation
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
## 2021-06-26 NOTE — Assessment & Plan Note (Signed)
Noted on CT imaging on 05/25/21. Recommend pancreatic protocol MRI. MRI order placed

## 2021-06-26 NOTE — Telephone Encounter (Signed)
Spoke with pt to advise that work excuse is ready for pickup.  Placed in completed paperwork folder.  Pt will call the office when she arrives to have staff bring letter out to her, she is covid positive.

## 2021-06-26 NOTE — Assessment & Plan Note (Signed)
Symptoms x3 days. Declines molnupiravir or paxlovid. Encouraged rest, fluids, tylenol as needed for fever. Discussed isolation. Work note given. Follow up if symptoms don't improve or worsen.

## 2021-06-27 ENCOUNTER — Telehealth: Payer: BC Managed Care – PPO | Admitting: Nurse Practitioner

## 2021-06-27 NOTE — Telephone Encounter (Signed)
Noted & updated

## 2021-06-27 NOTE — Telephone Encounter (Addendum)
Updated- called patient to advise of revised work note available for pickup.- CC   Called patient; she asked if the work excuse can be re-done and excuse her for Monday, 10/31 and return to work 11/01 if possible.  See notes below.

## 2021-06-27 NOTE — Telephone Encounter (Signed)
Pt called and stated that she took a covid test this morning and it was positive. Pt would like to know if she should still return to work.   Cb#[phone]

## 2021-06-30 ENCOUNTER — Telehealth: Payer: Self-pay | Admitting: *Deleted

## 2021-06-30 NOTE — Telephone Encounter (Signed)
Faxed FMLA/STD to [employer] at [phone]

## 2021-07-01 ENCOUNTER — Ambulatory Visit (INDEPENDENT_AMBULATORY_CARE_PROVIDER_SITE_OTHER): Payer: BC Managed Care – PPO

## 2021-07-01 ENCOUNTER — Other Ambulatory Visit: Payer: Self-pay

## 2021-07-01 ENCOUNTER — Ambulatory Visit: Payer: Self-pay

## 2021-07-01 ENCOUNTER — Telehealth: Payer: Self-pay | Admitting: General Surgery

## 2021-07-01 DIAGNOSIS — L820 Inflamed seborrheic keratosis: Secondary | ICD-10-CM | POA: Diagnosis not present

## 2021-07-01 DIAGNOSIS — D2262 Melanocytic nevi of left upper limb, including shoulder: Secondary | ICD-10-CM | POA: Diagnosis not present

## 2021-07-01 DIAGNOSIS — Z09 Encounter for follow-up examination after completed treatment for conditions other than malignant neoplasm: Secondary | ICD-10-CM

## 2021-07-01 DIAGNOSIS — D225 Melanocytic nevi of trunk: Secondary | ICD-10-CM | POA: Diagnosis not present

## 2021-07-01 DIAGNOSIS — L538 Other specified erythematous conditions: Secondary | ICD-10-CM | POA: Diagnosis not present

## 2021-07-01 DIAGNOSIS — X32XXXA Exposure to sunlight, initial encounter: Secondary | ICD-10-CM | POA: Diagnosis not present

## 2021-07-01 DIAGNOSIS — L570 Actinic keratosis: Secondary | ICD-10-CM | POA: Diagnosis not present

## 2021-07-01 NOTE — Telephone Encounter (Signed)
Please see patient's request below and advise accordingly.

## 2021-07-01 NOTE — Telephone Encounter (Signed)
Pt. Reports she is recovering from COVID 19. Tried to return to work today but has dizziness, nausea and fatigue. Asking for her work note to continue through this week. Please advise pt.   Answer Assessment - Initial Assessment Questions 1. COVID-19 ONSET: "When did the symptoms of COVID-19 first start?"     Last week 2. DIAGNOSIS CONFIRMATION: "How were you diagnosed?" (e.g., COVID-19 oral or nasal viral test; COVID-19 antibody test; doctor visit)     Home test 3. MAIN SYMPTOM:  "What is your main concern or symptom right now?" (e.g., breathing difficulty, cough, fatigue. loss of smell)     Nausea, dizziness, fatigue 4. SYMPTOM ONSET: "When did the  Symptoms  start?"     Last week 5. BETTER-SAME-WORSE: "Are you getting better, staying the same, or getting worse over the last 1 to 2 weeks?"     Same 6. RECENT MEDICAL VISIT: "Have you been seen by a healthcare provider (doctor, NP, PA) for these persisting COVID-19 symptoms?" If Yes, ask: "When were you seen?" (e.g., date)     Yes 7. COUGH: "Do you have a cough?" If Yes, ask: "How bad is the cough?"       No 8. FEVER: "Do you have a fever?" If Yes, ask: "What is your temperature, how was it measured, and when did it start?"     No 9. BREATHING DIFFICULTY: "Are you having any trouble breathing?" If Yes, ask: "How bad is your breathing?" (e.g., mild, moderate, severe)    - MILD: No SOB at rest, mild SOB with walking, speaks normally in sentences, can lie down, no retractions, pulse < 100.    - MODERATE: SOB at rest, SOB with minimal exertion and prefers to sit, cannot lie down flat, speaks in phrases, mild retractions, audible wheezing, pulse 100-120.    - SEVERE: Very SOB at rest, speaks in single words, struggling to breathe, sitting hunched forward, retractions, pulse > 120       No 10. HIGH RISK DISEASE: "Do you have any chronic medical problems?" (e.g., asthma, heart or lung disease, weak immune system, obesity, etc.)       No 11.  VACCINE: "Have you gotten the COVID-19 vaccine?" If Yes, ask: "Which one, how many shots, when did you get it?"       Yes 12. BOOSTER: "Have you received your COVID-19 booster?" If Yes, ask: "Which one and when did you get it?"       yes 13. PREGNANCY: "Is there any chance you are pregnant?" "When was your last menstrual period?"       No 14. OTHER SYMPTOMS: "Do you have any other symptoms?"  (e.g., fatigue, headache, muscle pain, weakness)       Fatigue 15. O2 SATURATION MONITOR:  "Do you use an oxygen saturation monitor (pulse oximeter) at home?" If Yes, ask "What is your reading (oxygen level) today?" "What is your usual oxygen saturation reading?" (e.g., 95%)       No  Protocols used: Coronavirus (COVID-19) Persisting Symptoms Follow-up Call-[facility]

## 2021-07-01 NOTE — Telephone Encounter (Signed)
Please schedule virtual visit for re-evaluation

## 2021-07-01 NOTE — Telephone Encounter (Signed)
Patient states she has stitches poking through her incision. I asked patient to come to office for a quick look and that I would snip the stiches off.

## 2021-07-01 NOTE — Telephone Encounter (Signed)
Patient is calling and is asking if one of the nurses would give her a call back said she feels like she has stiches in her side. Please call patient and advise.

## 2021-07-01 NOTE — Telephone Encounter (Signed)
Patient has been scheduled Mychart appointment Thursday 07/03/2021.

## 2021-07-01 NOTE — Patient Instructions (Signed)
Please call office with any questions or concerns.

## 2021-07-03 ENCOUNTER — Telehealth (INDEPENDENT_AMBULATORY_CARE_PROVIDER_SITE_OTHER): Payer: BC Managed Care – PPO | Admitting: Nurse Practitioner

## 2021-07-03 ENCOUNTER — Encounter: Payer: Self-pay | Admitting: Nurse Practitioner

## 2021-07-03 ENCOUNTER — Other Ambulatory Visit: Payer: Self-pay

## 2021-07-03 VITALS — BP 121/82

## 2021-07-03 DIAGNOSIS — G9331 Postviral fatigue syndrome: Secondary | ICD-10-CM

## 2021-07-03 DIAGNOSIS — U071 COVID-19: Secondary | ICD-10-CM | POA: Diagnosis not present
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
## 2021-07-09 ENCOUNTER — Ambulatory Visit: Payer: BC Managed Care – PPO

## 2021-07-14 ENCOUNTER — Ambulatory Visit
Admission: RE | Admit: 2021-07-14 | Discharge: 2021-07-14 | Disposition: A | Discharge status: Home / Self Care | Payer: BC Managed Care – PPO | Source: Ambulatory Visit | Attending: Nurse Practitioner | Admitting: Nurse Practitioner

## 2021-07-14 ENCOUNTER — Other Ambulatory Visit: Payer: Self-pay

## 2021-07-14 DIAGNOSIS — K869 Disease of pancreas, unspecified: Secondary | ICD-10-CM | POA: Diagnosis not present

## 2021-07-14 DIAGNOSIS — K8689 Other specified diseases of pancreas: Secondary | ICD-10-CM | POA: Diagnosis not present

## 2021-07-14 DIAGNOSIS — K7689 Other specified diseases of liver: Secondary | ICD-10-CM | POA: Diagnosis not present

## 2021-07-14 IMAGING — MR MR ABDOMEN WO/W CM
18 of 20 series · 45 of 48 positions shown · IV contrast (gadavist)
Comparison: CT [DATE].

CLINICAL DATA: Follow-up pancreatic lesion seen on prior CT

EXAM:
MRI ABDOMEN WITHOUT AND WITH CONTRAST
TECHNIQUE: Multiplanar multisequence MR imaging of the abdomen was performed
both before and after the administration of intravenous contrast.
CONTRAST:  10mL GADAVIST GADOBUTROL 1 MMOL/ML IV SOLN

[Series 3: cor haste · coronal · 6.0mm · 1.19mm/px · 1 of 32 slices shown]
[im 1/32]
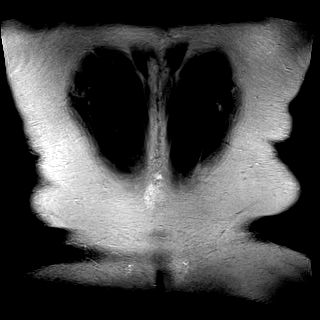

[Series 4: ax haste · axial · 6.0mm · 1.19mm/px · 1 of 34 slices shown]
[im 1/34]
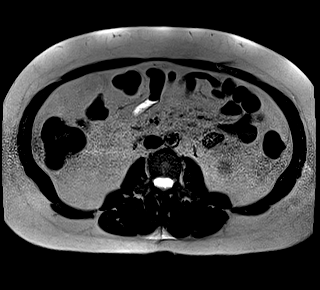

[Series 7: T2 fat-sat · axial · 6.0mm · 1.19mm/px · 1 of 34 slices shown]
[im 1/34]
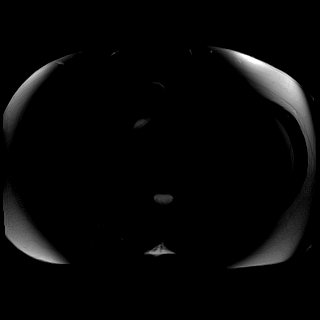

[Series 8: ax dwi_tracew · axial · 6.0mm · 1.42mm/px · z∈[+8,+246]mm · 4 of 102 slices shown]
[im 1/102]
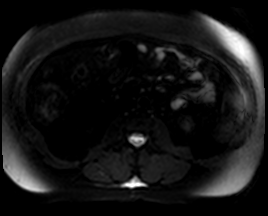
[im 34/102]
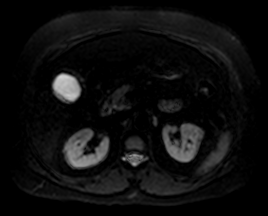
[im 68/102]
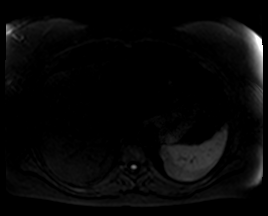
[im 102/102]
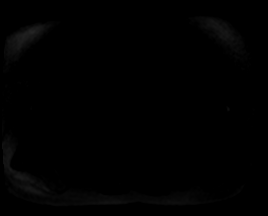

[Series 9: ax dwi_adc · axial · 6.0mm · 1.42mm/px · 1 of 34 slices shown]
[im 1/34]
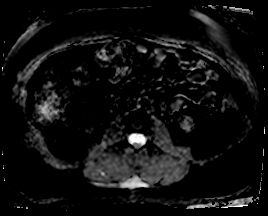

[Series 10: in & out · axial · 3.0mm · 1.19mm/px · z∈[-4,+257]mm · 3 of 88 slices shown (1 of 2)]
[im 1/88]
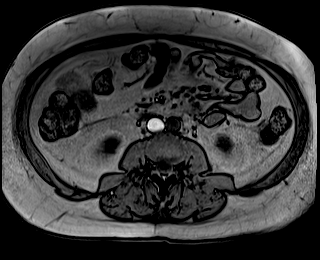
[im 44/88]
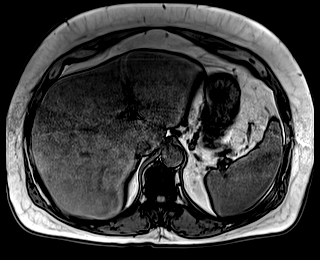
[im 88/88]
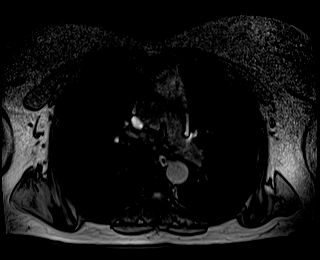

[Series 11: in & out · axial · 3.0mm · 1.19mm/px · z∈[-4,+257]mm · 3 of 88 slices shown (2 of 2)]
[im 1/88]
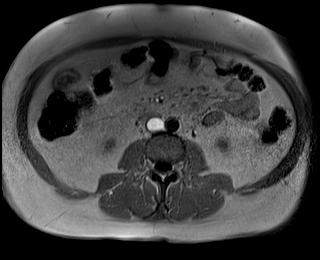
[im 44/88]
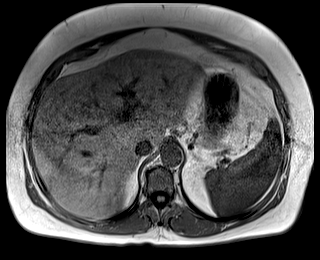
[im 88/88]
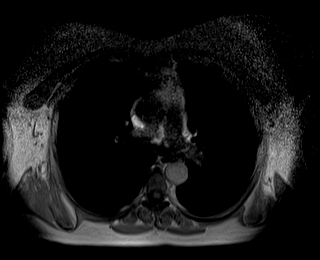

[Series 15: bSSFP · axial · 6.0mm · 0.74mm/px · 1 of 34 slices shown]
[im 1/34]
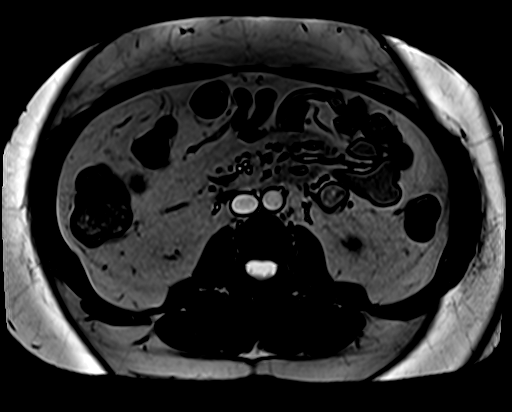

[Series 16: T1 dynamic fat-sat · axial · non-contrast · 3.0mm · 1.19mm/px · z∈[-4,+257]mm · 3 of 88 slices shown (1 of 5)]
[im 1/88]
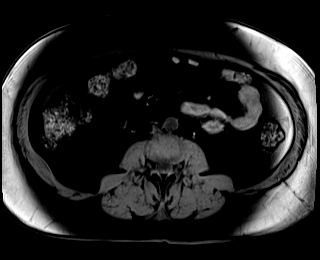
[im 44/88]
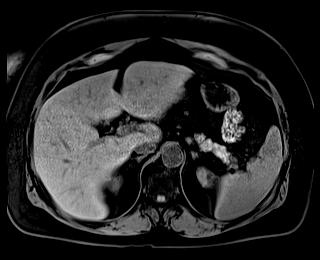
[im 88/88]
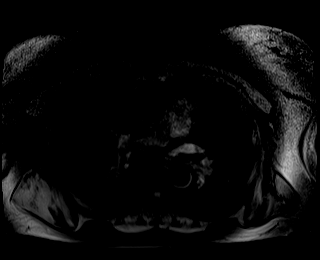

[Series 17: T1 dynamic fat-sat post-contrast · axial · 3.0mm · 1.19mm/px · z∈[-4,+257]mm · 3 of 88 slices shown (1 of 4)]
[im 1/88]
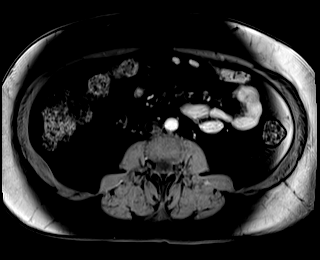
[im 44/88]
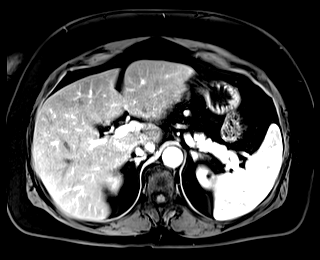
[im 88/88]
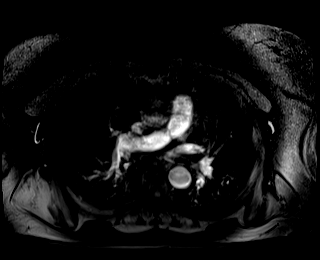

[Series 18: T1 dynamic fat-sat · axial · 3.0mm · 1.19mm/px · z∈[-4,+257]mm · 3 of 88 slices shown (2 of 5)]
[im 1/88]
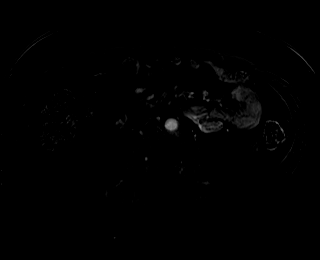
[im 44/88]
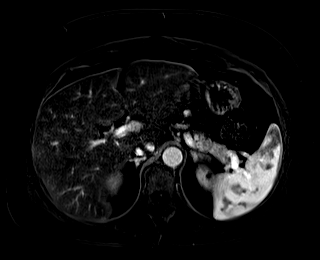
[im 88/88]
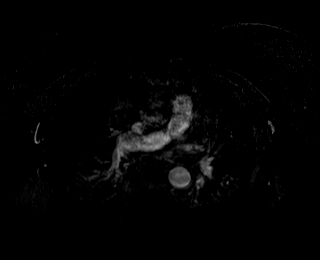

[Series 19: T1 dynamic fat-sat post-contrast · axial · 3.0mm · 1.19mm/px · z∈[-4,+257]mm · 3 of 88 slices shown (2 of 4)]
[im 1/88]
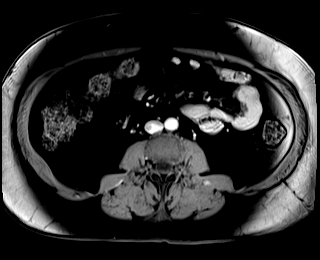
[im 44/88]
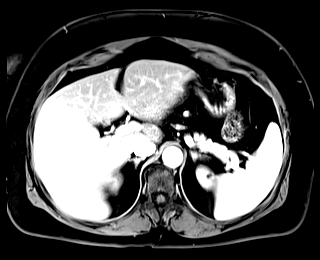
[im 88/88]
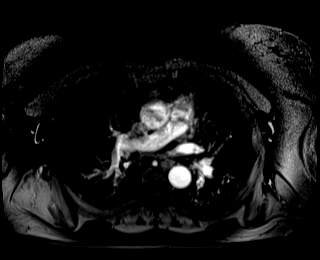

[Series 20: T1 dynamic fat-sat · axial · 3.0mm · 1.19mm/px · z∈[-4,+257]mm · 3 of 88 slices shown (3 of 5)]
[im 1/88]
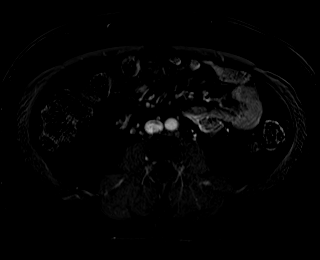
[im 44/88]
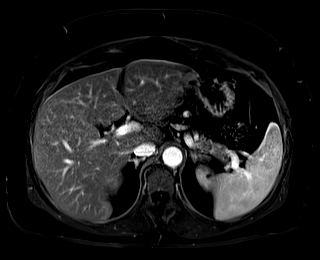
[im 88/88]
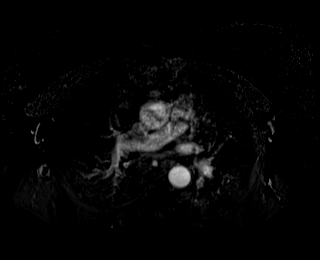

[Series 21: T1 dynamic fat-sat post-contrast · axial · 3.0mm · 1.19mm/px · z∈[-4,+257]mm · 3 of 88 slices shown (3 of 4)]
[im 1/88]
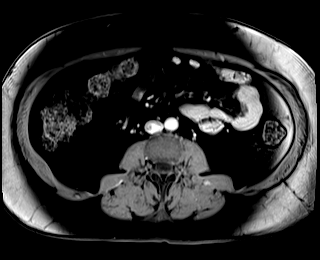
[im 44/88]
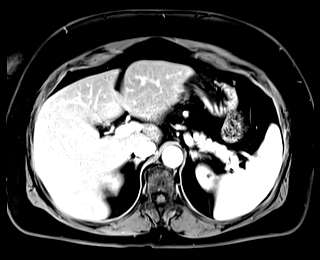
[im 88/88]
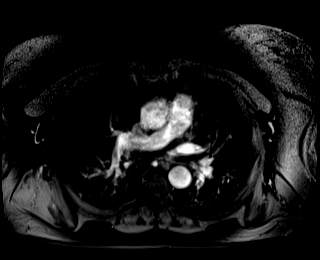

[Series 22: T1 dynamic fat-sat · axial · 3.0mm · 1.19mm/px · z∈[-4,+257]mm · 3 of 88 slices shown (4 of 5)]
[im 1/88]
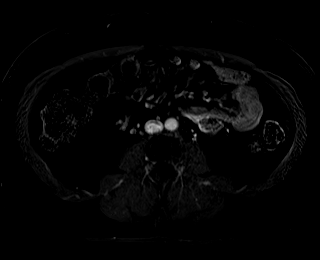
[im 44/88]
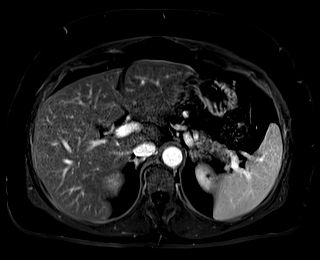
[im 88/88]
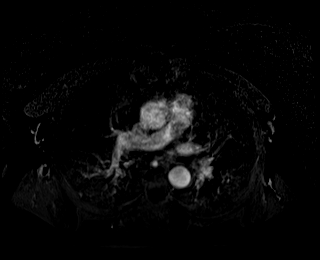

[Series 23: T1 dynamic post-contrast · coronal · 3.0mm · 1.31mm/px · 3 of 80 slices shown]
[im 1/80]
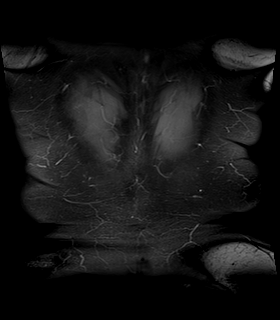
[im 40/80]
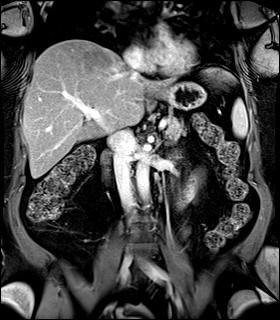
[im 80/80]
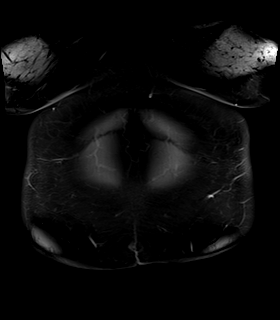

[Series 24: T1 dynamic fat-sat post-contrast · axial · 3.0mm · 1.19mm/px · z∈[-4,+257]mm · 3 of 88 slices shown (4 of 4)]
[im 1/88]
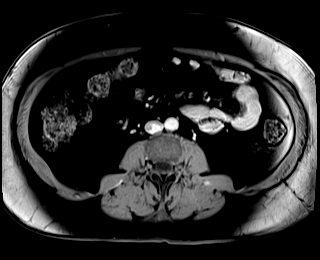
[im 44/88]
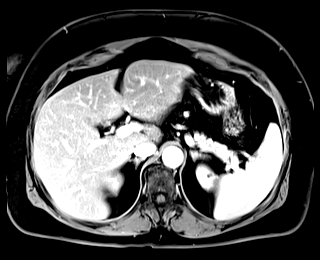
[im 88/88]
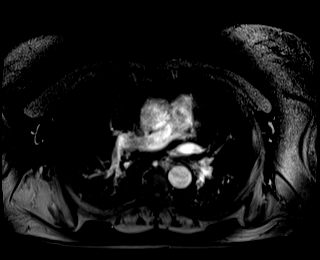

[Series 25: T1 dynamic fat-sat · axial · 3.0mm · 1.19mm/px · z∈[-4,+257]mm · 3 of 88 slices shown (5 of 5)]
[im 1/88]
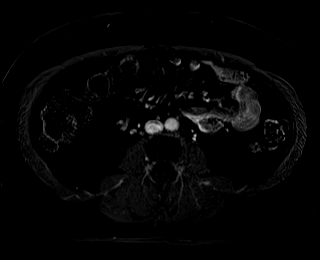
[im 44/88]
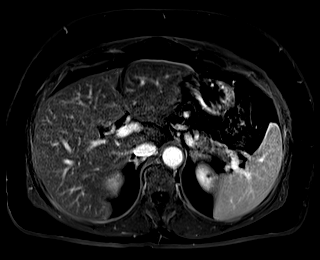
[im 88/88]
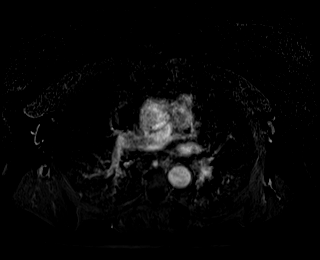

[45 of 48 positions shown; findings below may reference images not displayed]

FINDINGS: Lower chest: No acute abnormality.

Hepatobiliary: No suspicious hepatic lesion. 1 cm cyst in the left
lobe of the liver. Gallbladder is unremarkable. No biliary ductal
dilation.

Pancreas: In the head of the pancreas there is a focal area of
intrinsic T1 hypointensity relative to background pancreatic
parenchyma which is slightly hypoenhancing, measuring 14 mm on image
65/7 with loss of signal in this area on out of phase imaging for
instance on image 65/10 without abnormal T2 signal or reduced
diffusivity. No pancreatic ductal dilation. No pancreatic divisum.

Spleen:  Within normal limits in size and appearance.

Adrenals/Urinary Tract: No masses identified. No evidence of
hydronephrosis.

Stomach/Bowel: Visualized portions within the abdomen are
unremarkable.

Vascular/Lymphatic: No pathologically enlarged lymph nodes
identified. No abdominal aortic aneurysm demonstrated.

Other:  No abdominal ascites.

Musculoskeletal: No suspicious bone lesions identified.
IMPRESSION: Focal area in the head of the pancreas, which corresponds with prior
finding on CT, demonstrates imaging characteristics most consistent
with benign differential parenchymal fat. No evidence of suspicious
pancreatic lesion visualized.

## 2021-07-14 MED ORDER — GADOBUTROL 1 MMOL/ML IV SOLN
10.0000 mL | Freq: Once | INTRAVENOUS | Status: AC | PRN
  Administered 2021-07-14: 10 mL via INTRAVENOUS
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
## 2021-09-04 ENCOUNTER — Encounter: Payer: Self-pay | Admitting: Nurse Practitioner

## 2021-09-04 ENCOUNTER — Ambulatory Visit: Payer: BC Managed Care – PPO | Admitting: Nurse Practitioner

## 2021-09-04 ENCOUNTER — Other Ambulatory Visit: Payer: Self-pay

## 2021-09-04 VITALS — BP 100/64 | HR 77 | Temp 97.7°F | Ht 65.0 in | Wt 212.0 lb

## 2021-09-04 DIAGNOSIS — H9202 Otalgia, left ear: Secondary | ICD-10-CM | POA: Diagnosis not present

## 2021-09-04 DIAGNOSIS — R21 Rash and other nonspecific skin eruption: Secondary | ICD-10-CM | POA: Diagnosis not present

## 2021-09-04 DIAGNOSIS — M7989 Other specified soft tissue disorders: Secondary | ICD-10-CM

## 2021-09-04 MED ORDER — PREDNISONE 10 MG PO TABS: ORAL_TABLET | ORAL | 0 refills | Status: DC

## 2021-09-04 MED ORDER — TRIAMCINOLONE ACETONIDE 0.1 % EX CREA: 1.0000 "application " | TOPICAL_CREAM | Freq: Two times a day (BID) | CUTANEOUS | 0 refills | Status: DC

## 2021-09-16 DIAGNOSIS — H00014 Hordeolum externum left upper eyelid: Secondary | ICD-10-CM | POA: Diagnosis not present

## 2021-10-15 ENCOUNTER — Encounter: Payer: Self-pay | Admitting: Nurse Practitioner

## 2021-10-15 ENCOUNTER — Ambulatory Visit (INDEPENDENT_AMBULATORY_CARE_PROVIDER_SITE_OTHER): Payer: BC Managed Care – PPO | Admitting: Nurse Practitioner

## 2021-10-15 ENCOUNTER — Other Ambulatory Visit: Payer: Self-pay

## 2021-10-15 VITALS — BP 101/70 | HR 79 | Temp 98.1°F | Ht 65.0 in | Wt 212.4 lb

## 2021-10-15 DIAGNOSIS — R21 Rash and other nonspecific skin eruption: Secondary | ICD-10-CM | POA: Insufficient documentation

## 2021-10-15 DIAGNOSIS — E7800 Pure hypercholesterolemia, unspecified: Secondary | ICD-10-CM

## 2021-10-15 DIAGNOSIS — K219 Gastro-esophageal reflux disease without esophagitis: Secondary | ICD-10-CM | POA: Diagnosis not present

## 2021-10-15 DIAGNOSIS — Z0000 Encounter for general adult medical examination without abnormal findings: Secondary | ICD-10-CM | POA: Diagnosis not present

## 2021-10-15 DIAGNOSIS — R7301 Impaired fasting glucose: Secondary | ICD-10-CM | POA: Diagnosis not present

## 2021-10-15 DIAGNOSIS — F418 Other specified anxiety disorders: Secondary | ICD-10-CM

## 2021-10-15 DIAGNOSIS — E559 Vitamin D deficiency, unspecified: Secondary | ICD-10-CM

## 2021-10-15 DIAGNOSIS — E6609 Other obesity due to excess calories: Secondary | ICD-10-CM

## 2021-10-15 DIAGNOSIS — Z6835 Body mass index (BMI) 35.0-35.9, adult: Secondary | ICD-10-CM

## 2021-10-15 DIAGNOSIS — I10 Essential (primary) hypertension: Secondary | ICD-10-CM | POA: Diagnosis not present

## 2021-10-15 LAB — MICROALBUMIN, URINE WAIVED
Creatinine, Urine Waived: 300 mg/dL (ref 10–300)
Microalb, Ur Waived: 30 mg/L — ABNORMAL HIGH (ref 0–19)
Microalb/Creat Ratio: <30 mg/g (ref <30)

## 2021-10-15 MED ORDER — LISINOPRIL 10 MG PO TABS: 10.0000 mg | ORAL_TABLET | Freq: Every day | ORAL | 4 refills | Status: DC

## 2021-10-15 MED ORDER — EUCRISA 2 % EX OINT: TOPICAL_OINTMENT | CUTANEOUS | 3 refills | Status: AC

## 2021-10-15 MED ORDER — BUSPIRONE HCL 5 MG PO TABS: 5.0000 mg | ORAL_TABLET | Freq: Two times a day (BID) | ORAL | 4 refills | Status: DC | PRN

## 2021-10-15 NOTE — Assessment & Plan Note (Signed)
Chronic, stable.  Continue Protonix, which has offered benefit to her GERD symptoms.  Consider trial reduction in future.  Mag level at physical annually.  Return in 6 months. 

## 2021-10-15 NOTE — Assessment & Plan Note (Signed)
BMI 35.35.  Recommended eating smaller high protein, low fat meals more frequently and exercising 30 mins a day 5 times a week with a goal of 10-15lb weight loss in the next 3 months. Patient voiced their understanding and motivation to adhere to these recommendations. ° °

## 2021-10-15 NOTE — Patient Instructions (Signed)

## 2021-10-15 NOTE — Assessment & Plan Note (Signed)
Ongoing with poor response to steroid taper and steroid cream, will send in Eucrisa to see if benefit from this.  Return in 4 weeks for follow-up, if ongoing consider dermatology referral.

## 2021-10-15 NOTE — Assessment & Plan Note (Signed)
Ongoing, stable with occasional Buspar.  She has seen improvement with this.  Continue current medication regimen and adjust as needed.  Denies SI/HI.  Return to office in 6 months. 
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
## 2021-10-15 NOTE — Assessment & Plan Note (Signed)
Chronic, stable with BP at goal. Continue Lisinopril 10 MG daily and adjust as needed.  Continue to monitor BP at home and document + focus on DASH diet.  CMP, CBC, TSH, urine ALB today. Will consider reduction to discontinuation of Lisinopril next visits if remains under good control.  Return in 6 months or sooner if any changes.

## 2021-10-15 NOTE — Assessment & Plan Note (Signed)
ASCVD 4.3%.  Discussed with patient and educated.  IF LDL >190 or ASCVD 10% or greater will start statin, at this time recheck lipid panel and continue diet focus.

## 2021-10-15 NOTE — Assessment & Plan Note (Signed)
Recent A1c stable, recheck today. 

## 2021-10-16 ENCOUNTER — Encounter: Payer: Self-pay | Admitting: Nurse Practitioner

## 2021-10-16 LAB — CBC WITH DIFFERENTIAL/PLATELET
Basophils Absolute: 0 10*3/uL (ref 0.0–0.2)
Basos: 1 %
EOS (ABSOLUTE): 0.3 10*3/uL (ref 0.0–0.4)
Eos: 4 %
Hematocrit: 42.4 % (ref 34.0–46.6)
Hemoglobin: 14.2 g/dL (ref 11.1–15.9)
Immature Grans (Abs): 0 10*3/uL (ref 0.0–0.1)
Immature Granulocytes: 0 %
Lymphocytes Absolute: 2.4 10*3/uL (ref 0.7–3.1)
Lymphs: 29 %
MCH: 29.8 pg (ref 26.6–33.0)
MCHC: 33.5 g/dL (ref 31.5–35.7)
MCV: 89 fL (ref 79–97)
Monocytes Absolute: 0.7 10*3/uL (ref 0.1–0.9)
Monocytes: 9 %
Neutrophils Absolute: 4.8 10*3/uL (ref 1.4–7.0)
Neutrophils: 57 %
Platelets: 239 10*3/uL (ref 150–450)
RBC: 4.76 x10E6/uL (ref 3.77–5.28)
RDW: 12.3 % (ref 11.7–15.4)
WBC: 8.3 10*3/uL (ref 3.4–10.8)

## 2021-10-16 LAB — COMPREHENSIVE METABOLIC PANEL
ALT: 13 IU/L (ref 0–32)
AST: 17 IU/L (ref 0–40)
Albumin/Globulin Ratio: 2 (ref 1.2–2.2)
Albumin: 4.5 g/dL (ref 3.8–4.9)
Alkaline Phosphatase: 65 IU/L (ref 44–121)
BUN/Creatinine Ratio: 22 (ref 12–28)
BUN: 14 mg/dL (ref 8–27)
Bilirubin Total: 0.3 mg/dL (ref 0.0–1.2)
CO2: 24 mmol/L (ref 20–29)
Calcium: 9.5 mg/dL (ref 8.7–10.3)
Chloride: 103 mmol/L (ref 96–106)
Creatinine, Ser: 0.63 mg/dL (ref 0.57–1.00)
Globulin, Total: 2.2 g/dL (ref 1.5–4.5)
Glucose: 81 mg/dL (ref 70–99)
Potassium: 4.3 mmol/L (ref 3.5–5.2)
Sodium: 140 mmol/L (ref 134–144)
Total Protein: 6.7 g/dL (ref 6.0–8.5)
eGFR: 101 mL/min/{1.73_m2} (ref >59)

## 2021-10-16 LAB — LIPID PANEL W/O CHOL/HDL RATIO
Cholesterol, Total: 260 mg/dL — ABNORMAL HIGH (ref 100–199)
HDL: 40 mg/dL (ref >39)
LDL Chol Calc (NIH): 173 mg/dL — ABNORMAL HIGH (ref 0–99)
Triglycerides: 249 mg/dL — ABNORMAL HIGH (ref 0–149)
VLDL Cholesterol Cal: 47 mg/dL — ABNORMAL HIGH (ref 5–40)

## 2021-10-16 LAB — HEMOGLOBIN A1C
Est. average glucose Bld gHb Est-mCnc: 120 mg/dL
Hgb A1c MFr Bld: 5.8 % — ABNORMAL HIGH (ref 4.8–5.6)

## 2021-10-16 LAB — MAGNESIUM: Magnesium: 2.1 mg/dL (ref 1.6–2.3)

## 2021-10-16 LAB — VITAMIN D 25 HYDROXY (VIT D DEFICIENCY, FRACTURES): Vit D, 25-Hydroxy: 22.1 ng/mL — ABNORMAL LOW (ref 30.0–100.0)

## 2021-10-16 LAB — TSH: TSH: 1.55 u[IU]/mL (ref 0.450–4.500)

## 2021-10-16 NOTE — Progress Notes (Signed)
Letter to back printer, please send.

## 2021-10-22 ENCOUNTER — Telehealth (INDEPENDENT_AMBULATORY_CARE_PROVIDER_SITE_OTHER): Payer: BC Managed Care – PPO | Admitting: Internal Medicine

## 2021-10-22 ENCOUNTER — Encounter: Payer: Self-pay | Admitting: Internal Medicine

## 2021-10-22 VITALS — BP 121/78

## 2021-10-22 DIAGNOSIS — R6889 Other general symptoms and signs: Secondary | ICD-10-CM | POA: Diagnosis not present

## 2021-10-22 LAB — VERITOR FLU A/B WAIVED
Influenza A: NEGATIVE
Influenza B: NEGATIVE

## 2021-10-22 MED ORDER — ONDANSETRON HCL 8 MG PO TABS: 8.0000 mg | ORAL_TABLET | Freq: Three times a day (TID) | ORAL | 0 refills | Status: DC | PRN

## 2021-10-22 NOTE — Addendum Note (Signed)
Addended by: Leward Quan A on: 10/22/2021 01:58 PM   Modules accepted: Orders

## 2021-10-22 NOTE — Progress Notes (Signed)
BP 121/78    LMP  (LMP Unknown)    Subjective:    Patient ID: Stacie Bell, female    DOB: 15-Sep-1960, 61 y.o.   MRN: [id number]  Chief Complaint  Patient presents with   Emesis     Diarrhea, and a low grade fever at 99 per patient,  started last night, both started last night.     HPI: Stacie Bell is a 61 y.o. female   This visit was completed via telephone due to the restrictions of the COVID-19 pandemic. All issues as above were discussed and addressed but no physical exam was performed. If it was felt that the patient should be evaluated in the office, they were directed there. The patient verbally consented to this visit. Patient was unable to complete an audio/visual visit due to Technical difficulties. Due to the catastrophic nature of the COVID-19 pandemic, this visit was done through audio contact only. Location of the patient: home Location of the provider: work Those involved with this call:  Provider: Charlynne Cousins, MD CMA: Frazier Butt, [place] Desk/Registration: Myrlene Broker  Time spent on call: 10 minutes on the phone discussing health concerns. 10 minutes total spent in review of patient's record and preparation of their chart. Fever 99 F fever and diarrhea started last night , cramping in hands and legs, believes she has the flu, Has had diarrhea. Last night remp was 99 F.   Emesis  This is a new (x 2 am and the nat 4 am.) problem. The current episode started today. Associated symptoms include abdominal pain, diarrhea and a fever. Pertinent negatives include no arthralgias, headaches, myalgias, sweats, URI or weight loss.  Diarrhea  This is a new (x 4 pm) problem. The stool consistency is described as Watery. Associated symptoms include abdominal pain, a fever and vomiting. Pertinent negatives include no arthralgias, bloating, headaches, increased  flatus, myalgias, sweats, URI or weight loss.   Chief Complaint  Patient presents with   Emesis      Diarrhea, and a low grade fever at 99 per patient,  started last night, both started last night.     Relevant past medical, surgical, family and social history reviewed and updated as indicated. Interim medical history since our last visit reviewed. Allergies and medications reviewed and updated.  Review of Systems  Constitutional:  Positive for fever. Negative for weight loss.  Gastrointestinal:  Positive for abdominal pain, diarrhea and vomiting. Negative for bloating and flatus.  Musculoskeletal:  Negative for arthralgias and myalgias.  Neurological:  Negative for headaches.   Per HPI unless specifically indicated above     Objective:    BP 121/78    LMP  (LMP Unknown)   Wt Readings from Last 3 Encounters:  10/15/21 212 lb 6.4 oz (96.3 kg)  09/04/21 212 lb (96.2 kg)  06/11/21 207 lb 3.2 oz (94 kg)    Physical Exam  Results for orders placed or performed in visit on 10/15/21  Magnesium  Result Value Ref Range   Magnesium 2.1 1.6 - 2.3 mg/dL  CBC with Differential/Platelet  Result Value Ref Range   WBC 8.3 3.4 - 10.8 x10E3/uL   RBC 4.76 3.77 - 5.28 x10E6/uL   Hemoglobin 14.2 11.1 - 15.9 g/dL   Hematocrit 42.4 34.0 - 46.6 %   MCV 89 79 - 97 fL   MCH 29.8 26.6 - 33.0 pg   MCHC 33.5 31.5 - 35.7 g/dL   RDW 12.3 11.7 - 15.4 %  Platelets 239 150 - 450 x10E3/uL   Neutrophils 57 Not Estab. %   Lymphs 29 Not Estab. %   Monocytes 9 Not Estab. %   Eos 4 Not Estab. %   Basos 1 Not Estab. %   Neutrophils Absolute 4.8 1.4 - 7.0 x10E3/uL   Lymphocytes Absolute 2.4 0.7 - 3.1 x10E3/uL   Monocytes Absolute 0.7 0.1 - 0.9 x10E3/uL   EOS (ABSOLUTE) 0.3 0.0 - 0.4 x10E3/uL   Basophils Absolute 0.0 0.0 - 0.2 x10E3/uL   Immature Granulocytes 0 Not Estab. %   Immature Grans (Abs) 0.0 0.0 - 0.1 x10E3/uL  Comprehensive metabolic panel  Result Value Ref Range   Glucose 81 70 - 99 mg/dL   BUN 14 8 - 27 mg/dL   Creatinine, Ser 0.63 0.57 - 1.00 mg/dL   eGFR 101 >59 mL/min/1.73    BUN/Creatinine Ratio 22 12 - 28   Sodium 140 134 - 144 mmol/L   Potassium 4.3 3.5 - 5.2 mmol/L   Chloride 103 96 - 106 mmol/L   CO2 24 20 - 29 mmol/L   Calcium 9.5 8.7 - 10.3 mg/dL   Total Protein 6.7 6.0 - 8.5 g/dL   Albumin 4.5 3.8 - 4.9 g/dL   Globulin, Total 2.2 1.5 - 4.5 g/dL   Albumin/Globulin Ratio 2.0 1.2 - 2.2   Bilirubin Total 0.3 0.0 - 1.2 mg/dL   Alkaline Phosphatase 65 44 - 121 IU/L   AST 17 0 - 40 IU/L   ALT 13 0 - 32 IU/L  Lipid Panel w/o Chol/HDL Ratio  Result Value Ref Range   Cholesterol, Total 260 (H) 100 - 199 mg/dL   Triglycerides 249 (H) 0 - 149 mg/dL   HDL 40 >39 mg/dL   VLDL Cholesterol Cal 47 (H) 5 - 40 mg/dL   LDL Chol Calc (NIH) 173 (H) 0 - 99 mg/dL  TSH  Result Value Ref Range   TSH 1.550 0.450 - 4.500 uIU/mL  VITAMIN D 25 Hydroxy (Vit-D Deficiency, Fractures)  Result Value Ref Range   Vit D, 25-Hydroxy 22.1 (L) 30.0 - 100.0 ng/mL  HgB A1c  Result Value Ref Range   Hgb A1c MFr Bld 5.8 (H) 4.8 - 5.6 %   Est. average glucose Bld gHb Est-mCnc 120 mg/dL  Microalbumin, Urine Waived  Result Value Ref Range   Microalb, Ur Waived 30 (H) 0 - 19 mg/L   Creatinine, Urine Waived 300 10 - 300 mg/dL   Microalb/Creat Ratio <30 <30 mg/g         Assessment & Plan:  URI: Flu / COVID swabs today pt advised to take Tylenol q 4- 6 hourly as needed. pt to take allegra q pm as needed and to call office if symptoms worsened pt verbalised understanding of such.   Problem List Items Addressed This Visit       Other   Flu-like symptoms - Primary     No orders of the defined types were placed in this encounter.    Meds ordered this encounter  Medications   ondansetron (ZOFRAN) 8 MG tablet    Sig: Take 1 tablet (8 mg total) by mouth every 8 (eight) hours as needed for nausea or vomiting.    Dispense:  20 tablet    Refill:  0     Follow up plan: No follow-ups on file.

## 2021-10-24 ENCOUNTER — Encounter: Payer: Self-pay | Admitting: Internal Medicine

## 2021-10-24 ENCOUNTER — Ambulatory Visit: Payer: Self-pay | Admitting: *Deleted

## 2021-10-24 LAB — NOVEL CORONAVIRUS, NAA: SARS-CoV-2, NAA: NOT DETECTED

## 2021-10-24 NOTE — Telephone Encounter (Signed)
Summary: PAtient need to discuss what she had and get excuse note for work   Patient called in to inquire about her test results for Covid and Flu she had a few days ago and is asking if [employer] can write her a work excuse for the last 3 days she have been out waiting on her results. Also want to discuss what it is that she possibly had. Please call Ph# [phone]      Reason for Disposition  [1] Caller requesting NON-URGENT health information AND [2] PCP's office is the best resource  Answer Assessment - Initial Assessment Questions 1. REASON FOR CALL or QUESTION: "What is your reason for calling today?" or "How can I best help you?" or "What question do you have that I can help answer?"     Patient requesting letter for work-she has missed work due to illness and waiting on lab results. Patient advised today of lab results- patient states she is improving- she did have nausea yesterday and soft stool- but today is better. Patient states she still has some weakness after she does task. Patient is scheduled to work tomorrow and feels she needs at least one more day of rest to not be so fatigued at work.Patient request letter cover dates: 2/22-2/25 due to illness. Patient would like to pick her letter up today- please call her if possible.  Protocols used: Information Only Call - No Triage-A-AH

## 2021-10-24 NOTE — Telephone Encounter (Signed)
Patient's flu and COVID results were negative. It is likely that she had an upper respiratory infection.  I have written the letter for work.

## 2021-10-24 NOTE — Progress Notes (Signed)
Please let pt know this was normal.

## 2021-10-24 NOTE — Telephone Encounter (Signed)
Patient seen by Dr. Charlotta Newton. Routing to providers in office to advise.

## 2021-10-24 NOTE — Telephone Encounter (Signed)
Called pt to let her know her letter for work is ready for pick up

## 2021-11-12 ENCOUNTER — Ambulatory Visit (INDEPENDENT_AMBULATORY_CARE_PROVIDER_SITE_OTHER): Payer: BC Managed Care – PPO | Admitting: Nurse Practitioner

## 2021-11-12 ENCOUNTER — Other Ambulatory Visit: Payer: Self-pay

## 2021-11-12 ENCOUNTER — Encounter: Payer: Self-pay | Admitting: Nurse Practitioner

## 2021-11-12 DIAGNOSIS — R21 Rash and other nonspecific skin eruption: Secondary | ICD-10-CM

## 2021-11-12 NOTE — Patient Instructions (Signed)
Psoriasis Psoriasis is a long-term (chronic) skin condition. It occurs because your body's defense system (immune system) causes skin cells to form too quickly. This causes raised, red patches (plaques) on your skin that look silvery. The patches may be on all areas of your body.They can be any size or shape. Psoriasis can come and go. It can range from mild to very bad. It cannot be passed from one person to another (is not contagious). There is no cure for this condition, but it can be helped with treatment. What are the causes? The cause of psoriasis is not known. Some things can make it worse. These are: Skin damage, such as cuts, scrapes, sunburn, and dryness. Not getting enough sunlight. Some medicines. Alcohol. Tobacco. Stress. Infections. What increases the risk? Having a family member with psoriasis. Being very overweight (obese). Being 20-40 years old. Taking certain medicines. What are the signs or symptoms? There are different types of psoriasis. The types are: Plaque. This is the most common. Symptoms include red, raised patches with a silvery coating. These may be itchy. Your nails may be crumbly or fall off. Guttate. Symptoms include small red spots on your stomach area, arms, and legs. These may happen after you have been sick, such as with strep throat. Inverse. Symptoms include patches in your armpits, under your breasts, private areas, or on your butt. Pustular. Symptoms include pus-filled bumps on the palms of your hands or the soles of your feet. You also may feel very tired, weak, have a fever, and not be hungry. Erythrodermic. Symptoms include bright red skin that looks burned. You may have a fast heartbeat and a body temperature that is too high or too low. You may be itchy or in pain. Sebopsoriasis. Symptoms include red patches on your scalp, forehead, and face that are greasy. Psoriatic arthritis. Symptoms include swollen, painful joints along with scaly skin  patches. How is this treated? There is no cure for this condition, but treatment can: Help your skin heal. Lessen itching and irritation and swelling (inflammation). Slow the growth of new skin cells. Help your body's defense system respond better to your skin. Treatment may include: Creams or ointments. Light therapy. This may include natural sunlight or light therapy in a doctor's office. Medicines. These can help your body better manage skin cells. They may be used with light therapy or ointments. Medicines may include pills or injections. You may also get antibiotic medicines if you have an infection. Follow these instructions at home: Skin Care Apply lotion to your skin as needed. Only use those that your doctor has said are okay. Apply cool, wet cloths (cold compresses) to the affected areas. Do not use a hot tub or take hot showers. Use slightly warm, not hot, water when taking showers and baths. Do not scratch your skin. Lifestyle  Do not use any products that contain nicotine or tobacco, such as cigarettes, e-cigarettes, and chewing tobacco. If you need help quitting, ask your doctor. Lower your stress. Keep a healthy weight. Go out in the sun as told by your doctor. Do not get sunburned. Join a support group.  Medicines Take or use over-the-counter and prescription medicines only as told by your doctor. If you were prescribed an antibiotic medicine, take it as told by your doctor. Do not stop using the antibiotic even if you start to feel better. Alcohol use If you drink alcohol: Limit how much you use: 0-1 drink a day for women. 0-2 drinks a day for men.   Be aware of how much alcohol is in your drink. In the [place] one drink equals one 12 oz bottle of beer (355 mL), one 5 oz glass of wine (148 mL), or one 1 oz glass of hard liquor (44 mL). General instructions Keep a journal to track the things that cause symptoms (triggers). Try to avoid these things. See a counselor if  you feel the support would help. Keep all follow-up visits as told by your doctor. This is important. Contact a doctor if: You have a fever. Your pain gets worse. You have more redness or warmth in the affected areas. You have new or worse pain or stiffness in your joints. Your nails start to break easily or pull away from the nail bed. You feel very sad (depressed). Summary Psoriasis is a long-term (chronic) skin condition. There is no cure for this condition, but treatment can help manage it. Keep a journal to track the things that cause symptoms. Take or use over-the-counter and prescription medicines only as told by your doctor. Keep all follow-up visits as told by your doctor. This is important. This information is not intended to replace advice given to you by your health care provider. Make sure you discuss any questions you have with your healthcare provider. Document Revised: 06/21/2018 Document Reviewed: 06/21/2018 Elsevier Patient Education  2022 [employer]  

## 2021-11-12 NOTE — Assessment & Plan Note (Signed)
Improved with [facility].  Continue this as needed.  Return in 6 months for follow-up, if ongoing consider dermatology referral. ?

## 2021-11-12 NOTE — Progress Notes (Signed)
? ?BP 106/71   Pulse 74   Temp 97.6 ?F (36.4 ?C) (Oral)   Ht 5\' 5"  (1.651 m)   Wt 214 lb 6.4 oz (97.3 kg)   LMP  (LMP Unknown)   SpO2 97%   BMI 35.68 kg/m?   ? ?Subjective:  ? ? Patient ID: , female    DOB: 01-15-1961, 61 y.o.   MRN: [id number] ? ?HPI: ?Stacie Bell is a 61 y.o. female ? ?Chief Complaint  ?Patient presents with  ? Rash  ?  Patient states she has had the rash break out when she has occasionally got hot. Patient states she has not had any issues lately.   ? ?RASH ?Was treated with Prednisone and Triamcinolone on 09/04/21 for rash, that appeared like psoriasis.  Was on elbows, right knee, and left flank.  Changed cream to Eucrisa on 10/15/21, this has improved.   ?Duration:  months  ?Location: generalized  ?Itching: no ?Burning: no ?Redness: yes ?Oozing: no ?Scaling: no ?Blisters: no ?Painful: no ?Fevers: no ?Change in detergents/soaps/personal care products: no ?Recent illness: no ?Recent travel:no ?History of same: no ?Context: better ?Alleviating factors: Eucrisa ?Treatments attempted: Prednisone, Triamcinolone, Eucrisa ?Shortness of breath: no  ?Throat/tongue swelling: no ?Myalgias/arthralgias: no  ? ?Relevant past medical, surgical, family and social history reviewed and updated as indicated. Interim medical history since our last visit reviewed. ?Allergies and medications reviewed and updated. ? ?Review of Systems  ?Constitutional:  Negative for activity change, appetite change, diaphoresis, fatigue and fever.  ?Respiratory:  Negative for cough, chest tightness, shortness of breath and wheezing.   ?Cardiovascular:  Negative for chest pain, palpitations and leg swelling.  ?Gastrointestinal: Negative.   ?Skin:  Positive for rash.  ?Neurological: Negative.   ?Psychiatric/Behavioral:  Negative for decreased concentration, self-injury, sleep disturbance and suicidal ideas. The patient is not nervous/anxious.   ? ?Per HPI unless specifically indicated above ? ?   ?Objective:  ?   ?BP 106/71   Pulse 74   Temp 97.6 ?F (36.4 ?C) (Oral)   Ht 5\' 5"  (1.651 m)   Wt 214 lb 6.4 oz (97.3 kg)   LMP  (LMP Unknown)   SpO2 97%   BMI 35.68 kg/m?   ?Wt Readings from Last 3 Encounters:  ?11/12/21 214 lb 6.4 oz (97.3 kg)  ?10/15/21 212 lb 6.4 oz (96.3 kg)  ?09/04/21 212 lb (96.2 kg)  ?  ?Physical Exam ?Vitals and nursing note reviewed. Exam conducted with a chaperone present.  ?Constitutional:   ?   General: She is awake. She is not in acute distress. ?   Appearance: She is well-developed and well-groomed. She is obese. She is not ill-appearing or toxic-appearing.  ?HENT:  ?   Head: Normocephalic and atraumatic.  ?   Right Ear: Hearing, tympanic membrane, ear canal and external ear normal. No drainage.  ?   Left Ear: Hearing, tympanic membrane, ear canal and external ear normal. No drainage.  ?Eyes:  ?   General: Lids are normal.     ?   Right eye: No discharge.     ?   Left eye: No discharge.  ?   Conjunctiva/sclera: Conjunctivae normal.  ?   Pupils: Pupils are equal, round, and reactive to light.  ?Neck:  ?   Thyroid: No thyromegaly.  ?   Vascular: No carotid bruit.  ?   Trachea: Trachea normal.  ?Cardiovascular:  ?   Rate and Rhythm: Normal rate and regular rhythm.  ?   Heart  sounds: Normal heart sounds. No murmur heard. ?  No gallop.  ?Pulmonary:  ?   Effort: Pulmonary effort is normal. No accessory muscle usage or respiratory distress.  ?   Breath sounds: Normal breath sounds.  ?Abdominal:  ?   General: Bowel sounds are normal.  ?   Palpations: Abdomen is soft. There is no hepatomegaly or splenomegaly.  ?   Tenderness: There is no abdominal tenderness.  ?Musculoskeletal:     ?   General: Normal range of motion.  ?   Cervical back: Normal range of motion and neck supple.  ?   Right lower leg: No edema.  ?   Left lower leg: No edema.  ?Lymphadenopathy:  ?   Cervical: No cervical adenopathy.  ?Skin: ?   General: Skin is warm and dry.  ?   Capillary Refill: Capillary refill takes less than 2 seconds.   ?   Findings: Rash present. Rash is macular and papular.  ?   Comments: Rash present to left side of abdomen, although improving -- pallor color, and right knee improved.  No vesicles.  ?Neurological:  ?   Mental Status: She is alert and oriented to person, place, and time.  ?Psychiatric:     ?   Attention and Perception: Attention normal.     ?   Mood and Affect: Mood normal.     ?   Speech: Speech normal.     ?   Behavior: Behavior normal. Behavior is cooperative.     ?   Thought Content: Thought content normal.     ?   Judgment: Judgment normal.  ? ? ?Results for orders placed or performed in visit on 10/22/21  ?Novel Coronavirus, NAA ([facility])  ? Specimen: Nasopharyngeal(NP) swabs in vial transport medium  ?Result Value Ref Range  ? SARS-CoV-2, NAA Not Detected Not Detected  ?Veritor Flu A/B Waived  ?Result Value Ref Range  ? Influenza A Negative Negative  ? Influenza B Negative Negative  ? ?   ?Assessment & Plan:  ? ?Problem List Items Addressed This Visit   ? ?  ? Musculoskeletal and Integument  ? Rash  ?  Improved with [facility].  Continue this as needed.  Return in 6 months for follow-up, if ongoing consider dermatology referral. ?  ?  ?  ? ?Follow up plan: ?Return in about 6 months (around 05/15/2022) for HTN/HLD, IFG, MOOD. ? ? ? ? ? ?

## 2021-12-30 ENCOUNTER — Encounter: Payer: Self-pay | Admitting: Nurse Practitioner

## 2021-12-30 ENCOUNTER — Ambulatory Visit: Payer: BC Managed Care – PPO | Admitting: Family Medicine

## 2021-12-30 ENCOUNTER — Encounter: Payer: Self-pay | Admitting: Family Medicine

## 2021-12-30 VITALS — BP 108/74 | HR 74 | Temp 97.9°F | Wt 210.0 lb

## 2021-12-30 DIAGNOSIS — R300 Dysuria: Secondary | ICD-10-CM | POA: Diagnosis not present

## 2021-12-30 DIAGNOSIS — H6982 Other specified disorders of Eustachian tube, left ear: Secondary | ICD-10-CM

## 2021-12-30 DIAGNOSIS — B3731 Acute candidiasis of vulva and vagina: Secondary | ICD-10-CM | POA: Diagnosis not present

## 2021-12-30 LAB — URINALYSIS, ROUTINE W REFLEX MICROSCOPIC
Bilirubin, UA: NEGATIVE
Glucose, UA: NEGATIVE
Ketones, UA: NEGATIVE
Leukocytes,UA: NEGATIVE
Nitrite, UA: NEGATIVE
Protein,UA: NEGATIVE
RBC, UA: NEGATIVE
Specific Gravity, UA: 1.015 (ref 1.005–1.030)
Urobilinogen, Ur: 0.2 mg/dL (ref 0.2–1.0)
pH, UA: 7 (ref 5.0–7.5)

## 2021-12-30 MED ORDER — NYSTATIN 100000 UNIT/GM EX OINT: 1.0000 "application " | TOPICAL_OINTMENT | Freq: Two times a day (BID) | CUTANEOUS | 0 refills | Status: AC

## 2021-12-30 NOTE — Progress Notes (Signed)
? ?BP 108/74   Pulse 74   Temp 97.9 ?F (36.6 ?C)   Wt 210 lb (95.3 kg)   LMP  (LMP Unknown)   SpO2 97%   BMI 34.95 kg/m?   ? ?Subjective:  ? ? Patient ID: Stacie Bell, female    DOB: [birth year], 61 y.o.   MRN: [id number] ? ?HPI: ?Stacie Bell is a 61 y.o. female ? ?Chief Complaint  ?Patient presents with  ? Urinary Tract Infection  ?  Patient states symptoms began Sunday afternoon. Patient is having pain with urination and lower back pain.   ? Ear Pain  ?  Patient states her left ear is hurting, has been hurting on and off. Patient states it feels like there is fluid in there.   ? ?URINARY SYMPTOMS ?Duration: sunday ?Dysuria: yes ?Urinary frequency: yes ?Urgency: yes ?Small volume voids: no ?Symptom severity: moderate ?Urinary incontinence: no ?Foul odor: no ?Hematuria: no ?Abdominal pain: no ?Back pain: no ?Suprapubic pain/pressure: yes ?Flank pain: no ?Fever:  no ?Vomiting: no ?Relief with cranberry juice: no ?Relief with pyridium: no ?Status: stable ?Previous urinary tract infection: no ?Recurrent urinary tract infection: no ?Vaginal discharge: no ?Treatments attempted: increasing fluids  ? ?EAG CLOGGED ?Duration: weeks ?Involved ear(s):  left ?Sensation of feeling clogged/plugged: yes ?Decreased/muffled hearing:yes ?Ear pain: no ?Fever: no ?Otorrhea: no ?Hearing loss: no ?Upper respiratory infection symptoms: no ?Using Q-Tips: no ?Status: fluctuating ?History of cerumenosis: no ?Treatments attempted: none ?  ? ?Relevant past medical, surgical, family and social history reviewed and updated as indicated. Interim medical history since our last visit reviewed. ?Allergies and medications reviewed and updated. ? ?Review of Systems  ?Constitutional: Negative.   ?HENT:  Positive for ear pain. Negative for congestion, dental problem, drooling, ear discharge, facial swelling, hearing loss, mouth sores, nosebleeds, postnasal drip, rhinorrhea, sinus pressure, sinus pain, sneezing, sore throat, tinnitus,  trouble swallowing and voice change.   ?Respiratory: Negative.    ?Cardiovascular: Negative.   ?Gastrointestinal: Negative.   ?Genitourinary:  Positive for dysuria, frequency and urgency. Negative for decreased urine volume, difficulty urinating, dyspareunia, enuresis, flank pain, genital sores, hematuria, menstrual problem, pelvic pain, vaginal bleeding, vaginal discharge and vaginal pain.  ?Psychiatric/Behavioral: Negative.    ? ?Per HPI unless specifically indicated above ? ?   ?Objective:  ?  ?BP 108/74   Pulse 74   Temp 97.9 ?F (36.6 ?C)   Wt 210 lb (95.3 kg)   LMP  (LMP Unknown)   SpO2 97%   BMI 34.95 kg/m?   ?Wt Readings from Last 3 Encounters:  ?12/30/21 210 lb (95.3 kg)  ?11/12/21 214 lb 6.4 oz (97.3 kg)  ?10/15/21 212 lb 6.4 oz (96.3 kg)  ?  ?Physical Exam ?Vitals and nursing note reviewed.  ?Constitutional:   ?   General: She is not in acute distress. ?   Appearance: Normal appearance. She is not ill-appearing, toxic-appearing or diaphoretic.  ?HENT:  ?   Head: Normocephalic and atraumatic.  ?   Right Ear: Tympanic membrane, ear canal and external ear normal.  ?   Left Ear: Tympanic membrane, ear canal and external ear normal.  ?   Nose: Nose normal.  ?   Mouth/Throat:  ?   Mouth: Mucous membranes are moist.  ?   Pharynx: Oropharynx is clear.  ?Eyes:  ?   General: No scleral icterus.    ?   Right eye: No discharge.     ?   Left eye: No discharge.  ?  Extraocular Movements: Extraocular movements intact.  ?   Conjunctiva/sclera: Conjunctivae normal.  ?   Pupils: Pupils are equal, round, and reactive to light.  ?Cardiovascular:  ?   Rate and Rhythm: Normal rate and regular rhythm.  ?   Pulses: Normal pulses.  ?   Heart sounds: Normal heart sounds. No murmur heard. ?  No friction rub. No gallop.  ?Pulmonary:  ?   Effort: Pulmonary effort is normal. No respiratory distress.  ?   Breath sounds: Normal breath sounds. No stridor. No wheezing, rhonchi or rales.  ?Chest:  ?   Chest wall: No tenderness.   ?Genitourinary: ?   Comments: Irritation around vaginal opening with some silvery shiny area inside vagina ?Musculoskeletal:     ?   General: Normal range of motion.  ?   Cervical back: Normal range of motion and neck supple.  ?Skin: ?   General: Skin is warm and dry.  ?   Capillary Refill: Capillary refill takes less than 2 seconds.  ?   Coloration: Skin is not jaundiced or pale.  ?   Findings: No bruising, erythema, lesion or rash.  ?Neurological:  ?   General: No focal deficit present.  ?   Mental Status: She is alert and oriented to person, place, and time. Mental status is at baseline.  ?Psychiatric:     ?   Mood and Affect: Mood normal.     ?   Behavior: Behavior normal.     ?   Thought Content: Thought content normal.     ?   Judgment: Judgment normal.  ? ? ?Results for orders placed or performed in visit on 12/30/21  ?Urinalysis, Routine w reflex microscopic  ?Result Value Ref Range  ? Specific Gravity, UA 1.015 1.005 - 1.030  ? pH, UA 7.0 5.0 - 7.5  ? Color, UA Yellow Yellow  ? Appearance Ur Clear Clear  ? Leukocytes,UA Negative Negative  ? Protein,UA Negative Negative/Trace  ? Glucose, UA Negative Negative  ? Ketones, UA Negative Negative  ? RBC, UA Negative Negative  ? Bilirubin, UA Negative Negative  ? Urobilinogen, Ur 0.2 0.2 - 1.0 mg/dL  ? Nitrite, UA Negative Negative  ? ?   ?Assessment & Plan:  ? ?Problem List Items Addressed This Visit   ?None ?Visit Diagnoses   ? ? Vaginal yeast infection    -  Primary  ? 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   ? Relevant Medications  ? nystatin ointment (MYCOSTATIN)  ? Dysfunction of left eustachian tube      ? Start flonase. Call if not getting better or getting worse.   ? Dysuria      ? As scheduled.   ? Relevant Orders  ? Urinalysis, Routine w reflex microscopic (Completed)  ? ?  ?  ? ?Follow up plan: ?Return As scheduled. ? ? ? ? ? ?

## 2021-12-30 NOTE — Patient Instructions (Signed)
You can start flonase for your ear.  ?Call us if your burning does not get better with the nystatin.  ?

## 2022-03-16 DIAGNOSIS — D2262 Melanocytic nevi of left upper limb, including shoulder: Secondary | ICD-10-CM | POA: Diagnosis not present

## 2022-03-16 DIAGNOSIS — D0462 Carcinoma in situ of skin of left upper limb, including shoulder: Secondary | ICD-10-CM | POA: Diagnosis not present

## 2022-03-16 DIAGNOSIS — L728 Other follicular cysts of the skin and subcutaneous tissue: Secondary | ICD-10-CM | POA: Diagnosis not present

## 2022-03-16 DIAGNOSIS — L538 Other specified erythematous conditions: Secondary | ICD-10-CM | POA: Diagnosis not present

## 2022-03-16 DIAGNOSIS — D485 Neoplasm of uncertain behavior of skin: Secondary | ICD-10-CM | POA: Diagnosis not present

## 2022-03-16 DIAGNOSIS — L309 Dermatitis, unspecified: Secondary | ICD-10-CM | POA: Diagnosis not present

## 2022-03-16 DIAGNOSIS — X32XXXA Exposure to sunlight, initial encounter: Secondary | ICD-10-CM | POA: Diagnosis not present

## 2022-03-16 DIAGNOSIS — D225 Melanocytic nevi of trunk: Secondary | ICD-10-CM | POA: Diagnosis not present

## 2022-03-16 DIAGNOSIS — L820 Inflamed seborrheic keratosis: Secondary | ICD-10-CM | POA: Diagnosis not present

## 2022-03-16 DIAGNOSIS — Z85828 Personal history of other malignant neoplasm of skin: Secondary | ICD-10-CM | POA: Diagnosis not present

## 2022-03-16 DIAGNOSIS — L570 Actinic keratosis: Secondary | ICD-10-CM | POA: Diagnosis not present

## 2022-03-26 ENCOUNTER — Other Ambulatory Visit: Payer: Self-pay | Admitting: Obstetrics and Gynecology

## 2022-03-26 DIAGNOSIS — Z1231 Encounter for screening mammogram for malignant neoplasm of breast: Secondary | ICD-10-CM

## 2022-04-20 ENCOUNTER — Ambulatory Visit
Admission: RE | Admit: 2022-04-20 | Discharge: 2022-04-20 | Disposition: A | Discharge status: Home / Self Care | Payer: BC Managed Care – PPO | Source: Ambulatory Visit | Attending: Obstetrics and Gynecology | Admitting: Obstetrics and Gynecology

## 2022-04-20 DIAGNOSIS — Z1231 Encounter for screening mammogram for malignant neoplasm of breast: Secondary | ICD-10-CM | POA: Insufficient documentation

## 2022-04-22 ENCOUNTER — Other Ambulatory Visit: Payer: Self-pay | Admitting: Obstetrics and Gynecology

## 2022-04-22 DIAGNOSIS — R928 Other abnormal and inconclusive findings on diagnostic imaging of breast: Secondary | ICD-10-CM

## 2022-04-22 DIAGNOSIS — R921 Mammographic calcification found on diagnostic imaging of breast: Secondary | ICD-10-CM

## 2022-04-29 ENCOUNTER — Ambulatory Visit
Admission: RE | Admit: 2022-04-29 | Discharge: 2022-04-29 | Disposition: A | Discharge status: Home / Self Care | Payer: BC Managed Care – PPO | Source: Ambulatory Visit | Attending: Obstetrics and Gynecology | Admitting: Obstetrics and Gynecology

## 2022-04-29 DIAGNOSIS — R928 Other abnormal and inconclusive findings on diagnostic imaging of breast: Secondary | ICD-10-CM | POA: Diagnosis not present

## 2022-04-29 DIAGNOSIS — R921 Mammographic calcification found on diagnostic imaging of breast: Secondary | ICD-10-CM | POA: Insufficient documentation

## 2022-04-30 ENCOUNTER — Encounter: Payer: Self-pay | Admitting: Obstetrics and Gynecology

## 2022-05-05 DIAGNOSIS — L538 Other specified erythematous conditions: Secondary | ICD-10-CM | POA: Diagnosis not present

## 2022-05-05 DIAGNOSIS — L728 Other follicular cysts of the skin and subcutaneous tissue: Secondary | ICD-10-CM | POA: Diagnosis not present

## 2022-05-05 DIAGNOSIS — R208 Other disturbances of skin sensation: Secondary | ICD-10-CM | POA: Diagnosis not present

## 2022-05-05 DIAGNOSIS — D0462 Carcinoma in situ of skin of left upper limb, including shoulder: Secondary | ICD-10-CM | POA: Diagnosis not present

## 2022-05-05 DIAGNOSIS — L720 Epidermal cyst: Secondary | ICD-10-CM | POA: Diagnosis not present

## 2022-05-07 ENCOUNTER — Other Ambulatory Visit (HOSPITAL_COMMUNITY)
Admission: RE | Admit: 2022-05-07 | Discharge: 2022-05-07 | Disposition: A | Discharge status: Home / Self Care | Payer: BC Managed Care – PPO | Source: Ambulatory Visit | Attending: Obstetrics and Gynecology | Admitting: Obstetrics and Gynecology

## 2022-05-07 ENCOUNTER — Encounter: Payer: Self-pay | Admitting: Obstetrics and Gynecology

## 2022-05-07 ENCOUNTER — Ambulatory Visit (INDEPENDENT_AMBULATORY_CARE_PROVIDER_SITE_OTHER): Payer: BC Managed Care – PPO | Admitting: Obstetrics and Gynecology

## 2022-05-07 VITALS — BP 102/70 | Ht 65.0 in | Wt 209.0 lb

## 2022-05-07 DIAGNOSIS — Z1151 Encounter for screening for human papillomavirus (HPV): Secondary | ICD-10-CM | POA: Diagnosis not present

## 2022-05-07 DIAGNOSIS — Z8742 Personal history of other diseases of the female genital tract: Secondary | ICD-10-CM | POA: Diagnosis not present

## 2022-05-07 DIAGNOSIS — Z1231 Encounter for screening mammogram for malignant neoplasm of breast: Secondary | ICD-10-CM

## 2022-05-07 DIAGNOSIS — R1032 Left lower quadrant pain: Secondary | ICD-10-CM

## 2022-05-07 DIAGNOSIS — Z124 Encounter for screening for malignant neoplasm of cervix: Secondary | ICD-10-CM | POA: Insufficient documentation

## 2022-05-07 DIAGNOSIS — Z01419 Encounter for gynecological examination (general) (routine) without abnormal findings: Secondary | ICD-10-CM

## 2022-05-07 DIAGNOSIS — N760 Acute vaginitis: Secondary | ICD-10-CM

## 2022-05-07 DIAGNOSIS — R921 Mammographic calcification found on diagnostic imaging of breast: Secondary | ICD-10-CM

## 2022-05-07 MED ORDER — CLOTRIMAZOLE-BETAMETHASONE 1-0.05 % EX CREA: TOPICAL_CREAM | CUTANEOUS | 0 refills | Status: DC
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
## 2022-05-07 NOTE — Patient Instructions (Signed)
I value your feedback and you entrusting us with your care. If you get a [place] patient survey, I would appreciate you taking the time to let us know about your experience today. Thank you!  [facility]: [phone]      

## 2022-05-08 ENCOUNTER — Other Ambulatory Visit: Payer: Self-pay | Admitting: Obstetrics and Gynecology

## 2022-05-08 DIAGNOSIS — R928 Other abnormal and inconclusive findings on diagnostic imaging of breast: Secondary | ICD-10-CM

## 2022-05-08 DIAGNOSIS — R921 Mammographic calcification found on diagnostic imaging of breast: Secondary | ICD-10-CM

## 2022-05-10 DIAGNOSIS — E559 Vitamin D deficiency, unspecified: Secondary | ICD-10-CM | POA: Insufficient documentation

## 2022-05-10 NOTE — Patient Instructions (Addendum)
Magnesium 400 MG at night  DASH Eating Plan DASH stands for Dietary Approaches to Stop Hypertension. The DASH eating plan is a healthy eating plan that has been shown to: Reduce high blood pressure (hypertension). Reduce your risk for type 2 diabetes, heart disease, and stroke. Help with weight loss. What are tips for following this plan? Reading food labels Check food labels for the amount of salt (sodium) per serving. Choose foods with less than 5 percent of the Daily Value of sodium. Generally, foods with less than 300 milligrams (mg) of sodium per serving fit into this eating plan. To find whole grains, look for the word "whole" as the first word in the ingredient list. Shopping Buy products labeled as "low-sodium" or "no salt added." Buy fresh foods. Avoid canned foods and pre-made or frozen meals. Cooking Avoid adding salt when cooking. Use salt-free seasonings or herbs instead of table salt or sea salt. Check with your health care provider or pharmacist before using salt substitutes. Do not fry foods. Cook foods using healthy methods such as baking, boiling, grilling, roasting, and broiling instead. Cook with heart-healthy oils, such as olive, canola, avocado, soybean, or sunflower oil. Meal planning  Eat a balanced diet that includes: 4 or more servings of fruits and 4 or more servings of vegetables each day. Try to fill one-half of your plate with fruits and vegetables. 6-8 servings of whole grains each day. Less than 6 oz (170 g) of lean meat, poultry, or fish each day. A 3-oz (85-g) serving of meat is about the same size as a deck of cards. One egg equals 1 oz (28 g). 2-3 servings of low-fat dairy each day. One serving is 1 cup (237 mL). 1 serving of nuts, seeds, or beans 5 times each week. 2-3 servings of heart-healthy fats. Healthy fats called omega-3 fatty acids are found in foods such as walnuts, flaxseeds, fortified milks, and eggs. These fats are also found in cold-water  fish, such as sardines, salmon, and mackerel. Limit how much you eat of: Canned or prepackaged foods. Food that is high in trans fat, such as some fried foods. Food that is high in saturated fat, such as fatty meat. Desserts and other sweets, sugary drinks, and other foods with added sugar. Full-fat dairy products. Do not salt foods before eating. Do not eat more than 4 egg yolks a week. Try to eat at least 2 vegetarian meals a week. Eat more home-cooked food and less restaurant, buffet, and fast food. Lifestyle When eating at a restaurant, ask that your food be prepared with less salt or no salt, if possible. If you drink alcohol: Limit how much you use to: 0-1 drink a day for women who are not pregnant. 0-2 drinks a day for men. Be aware of how much alcohol is in your drink. In the [place] one drink equals one 12 oz bottle of beer (355 mL), one 5 oz glass of wine (148 mL), or one 1 oz glass of hard liquor (44 mL). General information Avoid eating more than 2,300 mg of salt a day. If you have hypertension, you may need to reduce your sodium intake to 1,500 mg a day. Work with your health care provider to maintain a healthy body weight or to lose weight. Ask what an ideal weight is for you. Get at least 30 minutes of exercise that causes your heart to beat faster (aerobic exercise) most days of the week. Activities may include walking, swimming, or biking. Work with your  health care provider or dietitian to adjust your eating plan to your individual calorie needs. What foods should I eat? Fruits All fresh, dried, or frozen fruit. Canned fruit in natural juice (without added sugar). Vegetables Fresh or frozen vegetables (raw, steamed, roasted, or grilled). Low-sodium or reduced-sodium tomato and vegetable juice. Low-sodium or reduced-sodium tomato sauce and tomato paste. Low-sodium or reduced-sodium canned vegetables. Grains Whole-grain or whole-wheat bread. Whole-grain or whole-wheat  pasta. Brown rice. [place]. Bulgur. Whole-grain and low-sodium cereals. Pita bread. Low-fat, low-sodium crackers. Whole-wheat flour tortillas. Meats and other proteins Skinless chicken or [place]. Ground chicken or [place]. Pork with fat trimmed off. Fish and seafood. Egg whites. Dried beans, peas, or lentils. Unsalted nuts, nut butters, and seeds. Unsalted canned beans. Lean cuts of beef with fat trimmed off. Low-sodium, lean precooked or cured meat, such as sausages or meat loaves. Dairy Low-fat (1%) or fat-free (skim) milk. Reduced-fat, low-fat, or fat-free cheeses. Nonfat, low-sodium ricotta or cottage cheese. Low-fat or nonfat yogurt. Low-fat, low-sodium cheese. Fats and oils Soft margarine without trans fats. Vegetable oil. Reduced-fat, low-fat, or light mayonnaise and salad dressings (reduced-sodium). Canola, safflower, olive, avocado, soybean, and sunflower oils. Avocado. Seasonings and condiments Herbs. Spices. Seasoning mixes without salt. Other foods Unsalted popcorn and pretzels. Fat-free sweets. The items listed above may not be a complete list of foods and beverages you can eat. Contact a dietitian for more information. What foods should I avoid? Fruits Canned fruit in a light or heavy syrup. Fried fruit. Fruit in cream or butter sauce. Vegetables Creamed or fried vegetables. Vegetables in a cheese sauce. Regular canned vegetables (not low-sodium or reduced-sodium). Regular canned tomato sauce and paste (not low-sodium or reduced-sodium). Regular tomato and vegetable juice (not low-sodium or reduced-sodium). Angie Fava. Olives. Grains Baked goods made with fat, such as croissants, muffins, or some breads. Dry pasta or rice meal packs. Meats and other proteins Fatty cuts of meat. Ribs. Fried meat. Berniece Salines. Bologna, salami, and other precooked or cured meats, such as sausages or meat loaves. Fat from the back of a pig (fatback). Bratwurst. Salted nuts and seeds. Canned beans with  added salt. Canned or smoked fish. Whole eggs or egg yolks. Chicken or [place] with skin. Dairy Whole or 2% milk, cream, and half-and-half. Whole or full-fat cream cheese. Whole-fat or sweetened yogurt. Full-fat cheese. Nondairy creamers. Whipped toppings. Processed cheese and cheese spreads. Fats and oils Butter. Stick margarine. Lard. Shortening. Ghee. Bacon fat. Tropical oils, such as coconut, palm kernel, or palm oil. Seasonings and condiments Onion salt, garlic salt, seasoned salt, table salt, and sea salt. Worcestershire sauce. Tartar sauce. Barbecue sauce. Teriyaki sauce. Soy sauce, including reduced-sodium. Steak sauce. Canned and packaged gravies. Fish sauce. Oyster sauce. Cocktail sauce. Store-bought horseradish. Ketchup. Mustard. Meat flavorings and tenderizers. Bouillon cubes. Hot sauces. Pre-made or packaged marinades. Pre-made or packaged taco seasonings. Relishes. Regular salad dressings. Other foods Salted popcorn and pretzels. The items listed above may not be a complete list of foods and beverages you should avoid. Contact a dietitian for more information. Where to find more information National Heart, Lung, and Blood Institute: https://wilson-eaton.com/ American Heart Association: www.heart.org Academy of Nutrition and Dietetics: www.eatright.[place]: www.kidney.org Summary The DASH eating plan is a healthy eating plan that has been shown to reduce high blood pressure (hypertension). It may also reduce your risk for type 2 diabetes, heart disease, and stroke. When on the DASH eating plan, aim to eat more fresh fruits and vegetables, whole grains, lean proteins, low-fat dairy,  and heart-healthy fats. With the DASH eating plan, you should limit salt (sodium) intake to 2,300 mg a day. If you have hypertension, you may need to reduce your sodium intake to 1,500 mg a day. Work with your health care provider or dietitian to adjust your eating plan to your individual  calorie needs. This information is not intended to replace advice given to you by your health care provider. Make sure you discuss any questions you have with your health care provider. Document Revised: 07/21/2019 Document Reviewed: 07/21/2019 Elsevier Patient Education  [place].

## 2022-05-13 LAB — CYTOLOGY - PAP
Adequacy: ABSENT
Comment: NEGATIVE
Diagnosis: NEGATIVE
High risk HPV: NEGATIVE

## 2022-05-15 ENCOUNTER — Encounter: Payer: Self-pay | Admitting: Nurse Practitioner

## 2022-05-15 ENCOUNTER — Ambulatory Visit: Payer: BC Managed Care – PPO | Admitting: Nurse Practitioner

## 2022-05-15 VITALS — BP 95/67 | HR 71 | Temp 97.9°F | Ht 65.0 in | Wt 206.6 lb

## 2022-05-15 DIAGNOSIS — E7800 Pure hypercholesterolemia, unspecified: Secondary | ICD-10-CM | POA: Diagnosis not present

## 2022-05-15 DIAGNOSIS — E559 Vitamin D deficiency, unspecified: Secondary | ICD-10-CM | POA: Diagnosis not present

## 2022-05-15 DIAGNOSIS — I10 Essential (primary) hypertension: Secondary | ICD-10-CM | POA: Diagnosis not present

## 2022-05-15 DIAGNOSIS — F418 Other specified anxiety disorders: Secondary | ICD-10-CM | POA: Diagnosis not present

## 2022-05-15 DIAGNOSIS — Z6835 Body mass index (BMI) 35.0-35.9, adult: Secondary | ICD-10-CM

## 2022-05-15 DIAGNOSIS — E6609 Other obesity due to excess calories: Secondary | ICD-10-CM

## 2022-05-15 DIAGNOSIS — R7301 Impaired fasting glucose: Secondary | ICD-10-CM

## 2022-05-15 NOTE — Assessment & Plan Note (Signed)
Ongoing, stable with occasional Buspar.  She has seen improvement with this.  Continue current medication regimen and adjust as needed.  Denies SI/HI.  For sleep recommend trial of Magnesium 400 MG every night to help with rest and to relax legs. Return to office in 6 months.

## 2022-05-15 NOTE — Assessment & Plan Note (Signed)
BMI 34.38.  Recommended eating smaller high protein, low fat meals more frequently and exercising 30 mins a day 5 times a week with a goal of 10-15lb weight loss in the next 3 months. Patient voiced their understanding and motivation to adhere to these recommendations.  

## 2022-05-15 NOTE — Assessment & Plan Note (Signed)
Chronic, stable with BP well below goal in office today. Continue Lisinopril 10 MG daily and adjust as needed.  Continue to monitor BP at home and document + focus on DASH diet.  LABS: BMP. Will consider reduction to discontinuation of Lisinopril next visits if remains under good control.  Return in 6 months.

## 2022-05-15 NOTE — Progress Notes (Signed)
BP 95/67   Pulse 71   Temp 97.9 F (36.6 C) (Oral)   Ht 5\' 5"  (1.651 m)   Wt 206 lb 9.6 oz (93.7 kg)   LMP  (LMP Unknown)   SpO2 94%   BMI 34.38 kg/m    Subjective:    Patient ID: , female    DOB: August 25, 1961, 61 y.o.   MRN: [id number]  HPI: Stacie Bell is a 61 y.o. female  Chief Complaint  Patient presents with   Hyperlipidemia   IFG   Mood   Breast Problem    Patient says they found a spot on her breast and they will be taking a biopsy.    Recent abnormal mammogram -- has to go for breast biopsy upcoming 05/25/22.   HYPERTENSION / HYPERLIPIDEMIA Continues on Lisinopril, no current statin.  Last check A1c in February was 5.8%.  she has been working on diet -- not exercising much at this time.  Satisfied with current treatment? yes Duration of hypertension: chronic BP monitoring frequency: not checking BP range:  BP medication side effects: no Duration of hyperlipidemia: chronic Cholesterol supplements: fish oil Aspirin: no Recent stressors: no Recurrent headaches: no Visual changes: no Palpitations: no Dyspnea: no Chest pain: no Lower extremity edema: no Dizzy/lightheaded: no  The 10-year ASCVD risk score (Arnett DK, et al., 2019) is: 3.7%   Values used to calculate the score:     Age: 63 years     Sex: Female     Is Non-Hispanic African American: No     Diabetic: No     Tobacco smoker: No     Systolic Blood Pressure: 95 mmHg     Is BP treated: Yes     HDL Cholesterol: 40 mg/dL     Total Cholesterol: 260 mg/dL  ANXIETY Has Buspar she takes as needed -- occasionally takes. Mood status: stable Satisfied with current treatment?: yes Symptom severity: mild  Duration of current treatment : chronic Side effects: no Medication compliance: good compliance Depressed mood: no Anxious mood:  occasional Anhedonia: no Significant weight loss or gain: no Insomnia: yes hard to stay asleep -- does have restless sleep on occasion Fatigue:  yes Feelings of worthlessness or guilt: no Impaired concentration/indecisiveness: no Suicidal ideations: no Hopelessness: no Crying spells: no    05/15/2022    1:38 PM 12/30/2021    9:40 AM 11/12/2021    1:45 PM 10/15/2021    1:47 PM 04/24/2021    1:24 PM  Depression screen PHQ 2/9  Decreased Interest 0 0 0 0 0  Down, Depressed, Hopeless 0 0 0 0 0  PHQ - 2 Score 0 0 0 0 0  Altered sleeping 1 1 1 1  0  Tired, decreased energy 1 1 1 2  0  Change in appetite 0 0 1 0 0  Feeling bad or failure about yourself  0 0 0 0 0  Trouble concentrating 0 0 0 0 0  Moving slowly or fidgety/restless 0 1 0 0 0  Suicidal thoughts 0 0 0 0 0  PHQ-9 Score 2 3 3 3  0  Difficult doing work/chores Somewhat difficult   Not difficult at all      Relevant past medical, surgical, family and social history reviewed and updated as indicated. Interim medical history since our last visit reviewed. Allergies and medications reviewed and updated.  Review of Systems  Constitutional:  Negative for activity change, appetite change, diaphoresis, fatigue and fever.  Respiratory:  Negative for cough,  chest tightness, shortness of breath and wheezing.   Cardiovascular:  Negative for chest pain, palpitations and leg swelling.  Gastrointestinal: Negative.   Endocrine: Negative for polydipsia, polyphagia and polyuria.  Neurological: Negative.   Psychiatric/Behavioral:  Positive for sleep disturbance. Negative for decreased concentration, self-injury and suicidal ideas. The patient is not nervous/anxious.     Per HPI unless specifically indicated above     Objective:    BP 95/67   Pulse 71   Temp 97.9 F (36.6 C) (Oral)   Ht 5\' 5"  (1.651 m)   Wt 206 lb 9.6 oz (93.7 kg)   LMP  (LMP Unknown)   SpO2 94%   BMI 34.38 kg/m   Wt Readings from Last 3 Encounters:  05/15/22 206 lb 9.6 oz (93.7 kg)  05/07/22 209 lb (94.8 kg)  12/30/21 210 lb (95.3 kg)    Physical Exam Vitals and nursing note reviewed.  Constitutional:       General: She is awake. She is not in acute distress.    Appearance: She is well-developed and well-groomed. She is obese. She is not ill-appearing or toxic-appearing.  HENT:     Head: Normocephalic.     Right Ear: Hearing normal.     Left Ear: Hearing normal.  Eyes:     General: Lids are normal.        Right eye: No discharge.        Left eye: No discharge.     Conjunctiva/sclera: Conjunctivae normal.     Pupils: Pupils are equal, round, and reactive to light.  Neck:     Thyroid: No thyromegaly.     Vascular: No carotid bruit.  Cardiovascular:     Rate and Rhythm: Normal rate and regular rhythm.     Heart sounds: Normal heart sounds. No murmur heard.    No gallop.  Pulmonary:     Effort: Pulmonary effort is normal. No accessory muscle usage or respiratory distress.     Breath sounds: Normal breath sounds.  Abdominal:     General: Bowel sounds are normal.     Palpations: Abdomen is soft.  Musculoskeletal:     Cervical back: Normal range of motion and neck supple.     Right lower leg: No edema.     Left lower leg: No edema.  Lymphadenopathy:     Cervical: No cervical adenopathy.  Skin:    General: Skin is warm and dry.  Neurological:     Mental Status: She is alert and oriented to person, place, and time.     Deep Tendon Reflexes: Reflexes are normal and symmetric.     Reflex Scores:      Brachioradialis reflexes are 2+ on the right side and 2+ on the left side.      Patellar reflexes are 2+ on the right side and 2+ on the left side. Psychiatric:        Attention and Perception: Attention normal.        Mood and Affect: Mood normal.        Speech: Speech normal.        Behavior: Behavior normal. Behavior is cooperative.        Thought Content: Thought content normal.    Results for orders placed or performed in visit on 05/07/22  Cytology - PAP  Result Value Ref Range   High risk HPV Negative    Adequacy      Satisfactory for evaluation; transformation zone  component ABSENT.   Diagnosis      -  Negative for intraepithelial lesion or malignancy (NILM)   Comment Normal Reference Range HPV - Negative       Assessment & Plan:   Problem List Items Addressed This Visit       Cardiovascular and Mediastinum   Hypertension - Primary    Chronic, stable with BP well below goal in office today. Continue Lisinopril 10 MG daily and adjust as needed.  Continue to monitor BP at home and document + focus on DASH diet.  LABS: BMP. Will consider reduction to discontinuation of Lisinopril next visits if remains under good control.  Return in 6 months.      Relevant Orders   Basic metabolic panel     Endocrine   IFG (impaired fasting glucose)    Recent A1c 5.8%, recheck today.  Focus on diet and regular activity.      Relevant Orders   Basic metabolic panel   HgB A1c     Other   Elevated LDL cholesterol level    ASCVD 3.7%.  Discussed with patient and educated.  IF LDL >190 or ASCVD 10% or greater will start statin, at this time recheck lipid panel and continue diet focus.      Relevant Orders   Lipid Panel w/o Chol/HDL Ratio   Obesity    BMI 34.38.  Recommended eating smaller high protein, low fat meals more frequently and exercising 30 mins a day 5 times a week with a goal of 10-15lb weight loss in the next 3 months. Patient voiced their understanding and motivation to adhere to these recommendations.       Situational anxiety    Ongoing, stable with occasional Buspar.  She has seen improvement with this.  Continue current medication regimen and adjust as needed.  Denies SI/HI.  For sleep recommend trial of Magnesium 400 MG every night to help with rest and to relax legs. Return to office in 6 months.      Vitamin D deficiency    Ongoing, last labs.  Continue Vitamin D3 2000 units daily.  Recheck level today.      Relevant Orders   VITAMIN D 25 Hydroxy (Vit-D Deficiency, Fractures)     Follow up plan: Return in about 22 weeks (around  10/16/2022) for Annual Physical.

## 2022-05-15 NOTE — Assessment & Plan Note (Signed)
ASCVD 3.7%.  Discussed with patient and educated.  IF LDL >190 or ASCVD 10% or greater will start statin, at this time recheck lipid panel and continue diet focus.

## 2022-05-15 NOTE — Assessment & Plan Note (Addendum)
Recent A1c 5.8%, recheck today.  Focus on diet and regular activity.

## 2022-05-15 NOTE — Assessment & Plan Note (Signed)
Ongoing, last labs.  Continue Vitamin D3 2000 units daily.  Recheck level today. 

## 2022-05-16 LAB — BASIC METABOLIC PANEL
BUN/Creatinine Ratio: 21 (ref 12–28)
BUN: 15 mg/dL (ref 8–27)
CO2: 22 mmol/L (ref 20–29)
Calcium: 9.7 mg/dL (ref 8.7–10.3)
Chloride: 101 mmol/L (ref 96–106)
Creatinine, Ser: 0.73 mg/dL (ref 0.57–1.00)
Glucose: 87 mg/dL (ref 70–99)
Potassium: 4.5 mmol/L (ref 3.5–5.2)
Sodium: 140 mmol/L (ref 134–144)
eGFR: 94 mL/min/{1.73_m2} (ref >59)

## 2022-05-16 LAB — LIPID PANEL W/O CHOL/HDL RATIO
Cholesterol, Total: 254 mg/dL — ABNORMAL HIGH (ref 100–199)
HDL: 37 mg/dL — ABNORMAL LOW (ref >39)
LDL Chol Calc (NIH): 161 mg/dL — ABNORMAL HIGH (ref 0–99)
Triglycerides: 297 mg/dL — ABNORMAL HIGH (ref 0–149)
VLDL Cholesterol Cal: 56 mg/dL — ABNORMAL HIGH (ref 5–40)

## 2022-05-16 LAB — HEMOGLOBIN A1C
Est. average glucose Bld gHb Est-mCnc: 111 mg/dL
Hgb A1c MFr Bld: 5.5 % (ref 4.8–5.6)

## 2022-05-16 LAB — VITAMIN D 25 HYDROXY (VIT D DEFICIENCY, FRACTURES): Vit D, 25-Hydroxy: 24.5 ng/mL — ABNORMAL LOW (ref 30.0–100.0)

## 2022-05-16 NOTE — Progress Notes (Signed)
Good morning please let Stacie Bell know her labs have returned: - Cholesterol levels remain elevated, but continued recommendation to focus on diet changes and regular activity + recommend taking fish oil daily. - Vitamin D level remains a little on low side, ensure to take Vitamin D3 2000 units daily for bone health. - A1c is out of prediabetic range at this time and has lowered.  Great news!! - Remainder of labs are all normal.  Have a great day!! Keep being wonderful!!  Thank you for allowing me to participate in your care.  I appreciate you. Kindest regards, Lonni Dirden

## 2022-05-21 ENCOUNTER — Ambulatory Visit (INDEPENDENT_AMBULATORY_CARE_PROVIDER_SITE_OTHER): Payer: BC Managed Care – PPO

## 2022-05-21 DIAGNOSIS — R1032 Left lower quadrant pain: Secondary | ICD-10-CM

## 2022-05-25 ENCOUNTER — Ambulatory Visit
Admission: RE | Admit: 2022-05-25 | Discharge: 2022-05-25 | Disposition: A | Discharge status: Home / Self Care | Payer: BC Managed Care – PPO | Source: Ambulatory Visit | Attending: Obstetrics and Gynecology | Admitting: Obstetrics and Gynecology

## 2022-05-25 ENCOUNTER — Telehealth: Payer: Self-pay | Admitting: Obstetrics and Gynecology

## 2022-05-25 DIAGNOSIS — R921 Mammographic calcification found on diagnostic imaging of breast: Secondary | ICD-10-CM | POA: Diagnosis not present

## 2022-05-25 DIAGNOSIS — R928 Other abnormal and inconclusive findings on diagnostic imaging of breast: Secondary | ICD-10-CM | POA: Insufficient documentation

## 2022-05-25 DIAGNOSIS — Z1239 Encounter for other screening for malignant neoplasm of breast: Secondary | ICD-10-CM | POA: Diagnosis not present

## 2022-05-25 NOTE — Telephone Encounter (Signed)
Pt aware of LTO cyst on Gyn u/s. [facility] mgmt recommends yearly u/s f/u. Pt with occas twinges of pain LLQ. NSAIDs/heating pad. F/u if sx worsen. Otherwise, can check GYN u/s f/u at annual appt next yr.

## 2022-05-26 LAB — SURGICAL PATHOLOGY

## 2022-08-17 ENCOUNTER — Encounter: Payer: Self-pay | Admitting: Nurse Practitioner

## 2022-08-17 ENCOUNTER — Ambulatory Visit: Payer: Self-pay

## 2022-08-17 ENCOUNTER — Ambulatory Visit: Payer: BC Managed Care – PPO | Admitting: Nurse Practitioner

## 2022-08-17 VITALS — BP 117/83 | HR 82 | Temp 99.0°F | Ht 65.0 in | Wt 208.1 lb

## 2022-08-17 DIAGNOSIS — N760 Acute vaginitis: Secondary | ICD-10-CM | POA: Diagnosis not present

## 2022-08-17 DIAGNOSIS — B9689 Other specified bacterial agents as the cause of diseases classified elsewhere: Secondary | ICD-10-CM | POA: Diagnosis not present

## 2022-08-17 DIAGNOSIS — R399 Unspecified symptoms and signs involving the genitourinary system: Secondary | ICD-10-CM | POA: Diagnosis not present

## 2022-08-17 MED ORDER — METRONIDAZOLE 500 MG PO TABS: 500.0000 mg | ORAL_TABLET | Freq: Three times a day (TID) | ORAL | 0 refills | Status: AC

## 2022-08-17 NOTE — Progress Notes (Signed)
BP 117/83   Pulse 82   Temp 99 F (37.2 C) (Oral)   Ht 5\' 5"  (1.651 m)   Wt 208 lb 1.6 oz (94.4 kg)   LMP  (LMP Unknown)   SpO2 98%   BMI 34.63 kg/m    Subjective:    Patient ID: , female    DOB: 12/01/1960, 61 y.o.   MRN: [id number]  HPI: Stacie Bell is a 61 y.o. female  Chief Complaint  Patient presents with   foul odor in urine    Lower abd pain, started yesterday   URINARY SYMPTOMS Started two days ago with odor and burning. Dysuria: yes Urinary frequency: yes Urgency: yes Small volume voids: no Symptom severity: yes Urinary incontinence: no Foul odor: yes Hematuria: no Abdominal pain: no Back pain: no Suprapubic pain/pressure: yes Flank pain: no Fever:  subjective Vomiting: no Status: stable Previous urinary tract infection: yes Recurrent urinary tract infection: no Sexual activity: No sexually active History of sexually transmitted disease: no Treatments attempted: increasing fluids    Relevant past medical, surgical, family and social history reviewed and updated as indicated. Interim medical history since our last visit reviewed. Allergies and medications reviewed and updated.  Review of Systems  Constitutional:  Negative for activity change, appetite change, diaphoresis, fatigue and fever.  Respiratory: Negative.    Cardiovascular:  Negative for chest pain, palpitations and leg swelling.  Gastrointestinal:  Positive for diarrhea and nausea. Negative for abdominal distention, abdominal pain, constipation and vomiting.  Genitourinary:  Positive for dysuria, frequency and urgency. Negative for decreased urine volume and hematuria.  Neurological: Negative.   Psychiatric/Behavioral: Negative.     Per HPI unless specifically indicated above     Objective:    BP 117/83   Pulse 82   Temp 99 F (37.2 C) (Oral)   Ht 5\' 5"  (1.651 m)   Wt 208 lb 1.6 oz (94.4 kg)   LMP  (LMP Unknown)   SpO2 98%   BMI 34.63 kg/m   Wt Readings  from Last 3 Encounters:  08/17/22 208 lb 1.6 oz (94.4 kg)  05/15/22 206 lb 9.6 oz (93.7 kg)  05/07/22 209 lb (94.8 kg)    Physical Exam Vitals and nursing note reviewed.  Constitutional:      General: She is awake. She is not in acute distress.    Appearance: She is well-developed and well-groomed. She is obese. She is not ill-appearing or toxic-appearing.  HENT:     Head: Normocephalic.     Right Ear: Hearing, tympanic membrane, ear canal and external ear normal. Tympanic membrane is not injected.     Left Ear: Hearing, tympanic membrane, ear canal and external ear normal. Tympanic membrane is not injected.     Nose: Nose normal.     Mouth/Throat:     Mouth: Mucous membranes are moist.  Eyes:     General: Lids are normal.        Right eye: No discharge.        Left eye: No discharge.     Conjunctiva/sclera: Conjunctivae normal.     Pupils: Pupils are equal, round, and reactive to light.  Neck:     Vascular: No carotid bruit.  Cardiovascular:     Rate and Rhythm: Normal rate and regular rhythm.     Heart sounds: Normal heart sounds. No murmur heard.    No gallop.  Pulmonary:     Effort: Pulmonary effort is normal. No accessory muscle usage or respiratory  distress.     Breath sounds: Normal breath sounds.  Abdominal:     General: Bowel sounds are normal. There is no distension.     Palpations: Abdomen is soft.     Tenderness: There is no abdominal tenderness.  Musculoskeletal:     Cervical back: Normal range of motion and neck supple.     Right lower leg: No edema.     Left lower leg: No edema.  Skin:    General: Skin is warm and dry.  Neurological:     Mental Status: She is alert and oriented to person, place, and time.  Psychiatric:        Attention and Perception: Attention normal.        Mood and Affect: Mood normal.        Speech: Speech normal.        Behavior: Behavior normal. Behavior is cooperative.        Thought Content: Thought content normal.    Results  for orders placed or performed during the hospital encounter of 05/25/22  Surgical pathology  Result Value Ref Range   SURGICAL PATHOLOGY      SURGICAL PATHOLOGY CASE: [id number] PATIENT: [facility] Surgical Pathology Report     Specimen Submitted: A. Breast, right  Clinical History: CALCS.  R/O DCIS vs benign. Ribbon shaped clip deployed post biopsy.      DIAGNOSIS: A. 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 - NEGATIVE FOR ATYPIA AND MALIGNANCY.   GROSS DESCRIPTION: A. Labeled: Right breast stereo biopsy calcs upper outer quadrant Received: in a formalin-filled [place] collection device Specimen radiograph image(s) available for review Time/Date in fixative: Collected at 9:08 AM on 05/25/2022 and placed in formalin at 9:10 AM on 05/25/2022 Cold ischemic time: Less than 5 minutes Total fixation time: Approximately 8 hours Core pieces: Multiple Measurement: Aggregate, 7.2 x 1.1 x 0.3 cm Description / comments: Received are cores and fragments of yellow fibrofatty tissue.   The accompanying diagram has sections B-E checked. Only a scant amount of tissue is identified in section B. Inked: Blue Entirely submitted in cassette(s):  1 - section B 2 - section C 3 - section D 4 - section E 5 - remaining tissue fragments from sections A, F, and freely floating within the container  RB 05/25/2022  Final Diagnosis performed by Redmond Pulling, MD.   Electronically signed 05/26/2022 10:18:51AM The electronic signature indicates that the named Attending Pathologist has evaluated the specimen Technical component performed at [facility], [address] [place], [place] [address] Lab: [phone] Dir: Para Cossey Schimke, MD, MMM  Professional component performed at [facility], [facility], [address] [place], [place], [place] [address] Lab:  [phone] Dir: Beryle Quant, MD       Assessment & Plan:   Problem List Items Addressed This Visit       Genitourinary   Bacterial vaginosis - Primary    Acute -- wet prep + clue cells, UA negative.  Treat with Flagyl BID for 7 days, educated patient on this medication and use.  Educated on diagnosis.  Recommend plenty of fluids and take probiotic yogurt with abx therapy.      Relevant Medications   metroNIDAZOLE (FLAGYL) 500 MG tablet   Other Relevant Orders   Urinalysis, Routine w reflex microscopic   WET PREP FOR TRICH, YEAST, CLUE     Follow up plan: Return if symptoms worsen or fail to improve.

## 2022-08-17 NOTE — Patient Instructions (Signed)
Urinary Tract Infection, Adult A urinary tract infection (UTI) is an infection of any part of the urinary tract. The urinary tract includes: The kidneys. The ureters. The bladder. The urethra. These organs make, store, and get rid of pee (urine) in the body. What are the causes? This infection is caused by germs (bacteria) in your genital area. These germs grow and cause swelling (inflammation) of your urinary tract. What increases the risk? The following factors may make you more likely to develop this condition: Using a small, thin tube (catheter) to drain pee. Not being able to control when you pee or poop (incontinence). Being female. If you are female, these things can increase the risk: Using these methods to prevent pregnancy: A medicine that kills sperm (spermicide). A device that blocks sperm (diaphragm). Having low levels of a female hormone (estrogen). Being pregnant. You are more likely to develop this condition if: You have genes that add to your risk. You are sexually active. You take antibiotic medicines. You have trouble peeing because of: A prostate that is bigger than normal, if you are female. A blockage in the part of your body that drains pee from the bladder. A kidney stone. A nerve condition that affects your bladder. Not getting enough to drink. Not peeing often enough. You have other conditions, such as: Diabetes. A weak disease-fighting system (immune system). Sickle cell disease. Gout. Injury of the spine. What are the signs or symptoms? Symptoms of this condition include: Needing to pee right away. Peeing small amounts often. Pain or burning when peeing. Blood in the pee. Pee that smells bad or not like normal. Trouble peeing. Pee that is cloudy. Fluid coming from the vagina, if you are female. Pain in the belly or lower back. Other symptoms include: Vomiting. Not feeling hungry. Feeling mixed up (confused). This may be the first symptom in  older adults. Being tired and grouchy (irritable). A fever. Watery poop (diarrhea). How is this treated? Taking antibiotic medicine. Taking other medicines. Drinking enough water. In some cases, you may need to see a specialist. Follow these instructions at home:  Medicines Take over-the-counter and prescription medicines only as told by your doctor. If you were prescribed an antibiotic medicine, take it as told by your doctor. Do not stop taking it even if you start to feel better. General instructions Make sure you: Pee until your bladder is empty. Do not hold pee for a long time. Empty your bladder after sex. Wipe from front to back after peeing or pooping if you are a female. Use each tissue one time when you wipe. Drink enough fluid to keep your pee pale yellow. Keep all follow-up visits. Contact a doctor if: You do not get better after 1-2 days. Your symptoms go away and then come back. Get help right away if: You have very bad back pain. You have very bad pain in your lower belly. You have a fever. You have chills. You feeling like you will vomit or you vomit. Summary A urinary tract infection (UTI) is an infection of any part of the urinary tract. This condition is caused by germs in your genital area. There are many risk factors for a UTI. Treatment includes antibiotic medicines. Drink enough fluid to keep your pee pale yellow. This information is not intended to replace advice given to you by your health care provider. Make sure you discuss any questions you have with your health care provider. Document Revised: 03/29/2020 Document Reviewed: 03/29/2020 Elsevier Patient Education    2023 Elsevier Inc.  

## 2022-08-17 NOTE — Telephone Encounter (Signed)
  Chief Complaint: Burning with urination, off smell to urine, abdominal pain - Something going on with ears. Symptoms: above Frequency: a few days Pertinent Negatives: Patient denies  Disposition: [] ED /[] Urgent Care (no appt availability in office) / [x] Appointment(In office/virtual)/ []  [facility]/ [] Home Care/ [] Refused Recommended Disposition /[] [facility]/ []  Follow-up with PCP Additional Notes: Pt states that burning with urination , off smell to urine started a few days ago. Pt also has abdominal, which attributes to an ovarian cyst. Pt may have had ever last night which has resolved.   Pt also has something going on with her ears which need to be looked at.    Reason for Disposition  Bad or foul-smelling urine  Answer Assessment - Initial Assessment Questions 1. SYMPTOM: "What's the main symptom you're concerned about?" (e.g., frequency, incontinence)     Burning sensation with urination, off smell - Abdominal pain 2. ONSET: "When did the  s/s  start?"     A few days ago 3. PAIN: "Is there any pain?" If Yes, ask: "How bad is it?" (Scale: 1-10; mild, moderate, severe)     Yes - pt attributes pain to ovarian cyst. 4. CAUSE: "What do you think is causing the symptoms?"     UTI - ovarian cyst 5. OTHER SYMPTOMS: "Do you have any other symptoms?" (e.g., blood in urine, fever, flank pain, pain with urination)     Possible fever last night 6. PREGNANCY: "Is there any chance you are pregnant?" "When was your last menstrual period?"  Protocols used: Urinary Symptoms-A-AH

## 2022-08-17 NOTE — Assessment & Plan Note (Signed)
Acute -- wet prep + clue cells, UA negative.  Treat with Flagyl BID for 7 days, educated patient on this medication and use.  Educated on diagnosis.  Recommend plenty of fluids and take probiotic yogurt with abx therapy.

## 2022-08-18 LAB — URINALYSIS, ROUTINE W REFLEX MICROSCOPIC
Bilirubin, UA: NEGATIVE
Glucose, UA: NEGATIVE
Ketones, UA: NEGATIVE
Leukocytes,UA: NEGATIVE
Nitrite, UA: NEGATIVE
Protein,UA: NEGATIVE
RBC, UA: NEGATIVE
Specific Gravity, UA: >1.03 — ABNORMAL HIGH (ref 1.005–1.030)
Urobilinogen, Ur: 0.2 mg/dL (ref 0.2–1.0)
pH, UA: 5 (ref 5.0–7.5)

## 2022-08-18 LAB — WET PREP FOR TRICH, YEAST, CLUE
Clue Cell Exam: POSITIVE — AB
Trichomonas Exam: NEGATIVE
Yeast Exam: NEGATIVE

## 2022-08-19 ENCOUNTER — Telehealth: Payer: Self-pay | Admitting: Nurse Practitioner

## 2022-08-19 ENCOUNTER — Encounter: Payer: Self-pay | Admitting: Nurse Practitioner

## 2022-08-19 NOTE — Telephone Encounter (Signed)
Copied from CRM [id number]. Topic: General - Other >> Aug 19, 2022  8:28 AM Lyman Speller wrote: Reason for CRM: pt was written out of work until today but she was still not feeling well and wanted to get the note revised to include today and return to work tomorrow / please advise / pt asked if a photo of the letter can be texted to her today / please advise

## 2022-08-19 NOTE — Telephone Encounter (Signed)
Patient made aware of results and verbalized understanding.  

## 2022-09-17 DIAGNOSIS — Z85828 Personal history of other malignant neoplasm of skin: Secondary | ICD-10-CM | POA: Diagnosis not present

## 2022-09-17 DIAGNOSIS — B078 Other viral warts: Secondary | ICD-10-CM | POA: Diagnosis not present

## 2022-09-17 DIAGNOSIS — D225 Melanocytic nevi of trunk: Secondary | ICD-10-CM | POA: Diagnosis not present

## 2022-09-17 DIAGNOSIS — L570 Actinic keratosis: Secondary | ICD-10-CM | POA: Diagnosis not present

## 2022-09-17 DIAGNOSIS — X32XXXA Exposure to sunlight, initial encounter: Secondary | ICD-10-CM | POA: Diagnosis not present

## 2022-09-17 DIAGNOSIS — L538 Other specified erythematous conditions: Secondary | ICD-10-CM | POA: Diagnosis not present

## 2022-09-17 DIAGNOSIS — D2261 Melanocytic nevi of right upper limb, including shoulder: Secondary | ICD-10-CM | POA: Diagnosis not present

## 2022-09-17 DIAGNOSIS — D2262 Melanocytic nevi of left upper limb, including shoulder: Secondary | ICD-10-CM | POA: Diagnosis not present

## 2022-10-06 ENCOUNTER — Ambulatory Visit (INDEPENDENT_AMBULATORY_CARE_PROVIDER_SITE_OTHER): Payer: BC Managed Care – PPO | Admitting: Nurse Practitioner

## 2022-10-06 ENCOUNTER — Encounter: Payer: Self-pay | Admitting: Nurse Practitioner

## 2022-10-06 VITALS — BP 117/77 | HR 72 | Temp 97.8°F | Wt 215.8 lb

## 2022-10-06 DIAGNOSIS — N760 Acute vaginitis: Secondary | ICD-10-CM | POA: Diagnosis not present

## 2022-10-06 DIAGNOSIS — N898 Other specified noninflammatory disorders of vagina: Secondary | ICD-10-CM

## 2022-10-06 DIAGNOSIS — B9689 Other specified bacterial agents as the cause of diseases classified elsewhere: Secondary | ICD-10-CM

## 2022-10-06 MED ORDER — CLINDAMYCIN HCL 300 MG PO CAPS: 300.0000 mg | ORAL_CAPSULE | Freq: Two times a day (BID) | ORAL | 0 refills | Status: DC

## 2022-10-06 NOTE — Progress Notes (Unsigned)
   Wt 215 lb 12.8 oz (97.9 kg)   LMP  (LMP Unknown)   BMI 35.91 kg/m    Subjective:    Patient ID: Stacie Bell, female    DOB: [birth year], 61 y.o.   MRN: [id number]  HPI: [name] is a 62 y.o. female  Chief Complaint  Patient presents with   Vaginal Itching   Patient states she has been having some vaginal itching.  She was seen in December and diagnosed with BV.  Feels like it never got better.  States there is no fowl smell.  She has a burning sensation when she pees sometimes and itching.  Denies any discharge.     Relevant past medical, surgical, family and social history reviewed and updated as indicated. Interim medical history since our last visit reviewed. Allergies and medications reviewed and updated.  Review of Systems  Per HPI unless specifically indicated above     Objective:    Wt 215 lb 12.8 oz (97.9 kg)   LMP  (LMP Unknown)   BMI 35.91 kg/m   Wt Readings from Last 3 Encounters:  10/06/22 215 lb 12.8 oz (97.9 kg)  08/17/22 208 lb 1.6 oz (94.4 kg)  05/15/22 206 lb 9.6 oz (93.7 kg)    Physical Exam  Results for orders placed or performed in visit on 08/17/22  WET PREP FOR [place], YEAST, CLUE   Specimen: Sterile Swab   Sterile Swab  Result Value Ref Range   Trichomonas Exam Negative Negative   Yeast Exam Negative Negative   Clue Cell Exam Positive (A) Negative  Urinalysis, Routine w reflex microscopic  Result Value Ref Range   Specific Gravity, UA >1.030 (H) 1.005 - 1.030   pH, UA 5.0 5.0 - 7.5   Color, UA Yellow Yellow   Appearance Ur Clear Clear   Leukocytes,UA Negative Negative   Protein,UA Negative Negative/Trace   Glucose, UA Negative Negative   Ketones, UA Negative Negative   RBC, UA Negative Negative   Bilirubin, UA Negative Negative   Urobilinogen, Ur 0.2 0.2 - 1.0 mg/dL   Nitrite, UA Negative Negative   Microscopic Examination Comment       Assessment & Plan:   Problem List Items Addressed This Visit   None     Follow up plan: Return if symptoms worsen or fail to improve.

## 2022-10-07 NOTE — Assessment & Plan Note (Signed)
Will treat with Clindamycin. Not able to swab during office visit.  Will have patient come back 1 week after completing antibiotics to evaluate for further infection.

## 2022-10-11 NOTE — Patient Instructions (Signed)

## 2022-10-15 ENCOUNTER — Telehealth: Payer: Self-pay | Admitting: Nurse Practitioner

## 2022-10-15 NOTE — Telephone Encounter (Signed)
Copied from [place] [id number]. Topic: General - Inquiry >> Oct 14, 2022  2:26 PM Marcellus Scott wrote: Reason for CRM: Pt stated she was not aware that she was scheduled for two different appointments. Explained upcoming is CPE, then on 02/20 lab for vaginal swab. Stated she was under the impression that at her upcoming appointment on 02/16 she would be having her vaginal swab done. Pt stated she wants to make sure if she goes to the [facility] location, they will have what they need to swab her, as they did not have it the last time, and she does not want to have to pay again, so she wants everything done in one appointment.  Please advise.

## 2022-10-16 ENCOUNTER — Encounter: Payer: Self-pay | Admitting: Nurse Practitioner

## 2022-10-16 ENCOUNTER — Ambulatory Visit (INDEPENDENT_AMBULATORY_CARE_PROVIDER_SITE_OTHER): Payer: BC Managed Care – PPO | Admitting: Nurse Practitioner

## 2022-10-16 VITALS — BP 116/68 | HR 78 | Temp 97.8°F | Ht 65.0 in | Wt 216.9 lb

## 2022-10-16 DIAGNOSIS — K219 Gastro-esophageal reflux disease without esophagitis: Secondary | ICD-10-CM

## 2022-10-16 DIAGNOSIS — Z0000 Encounter for general adult medical examination without abnormal findings: Secondary | ICD-10-CM

## 2022-10-16 DIAGNOSIS — I10 Essential (primary) hypertension: Secondary | ICD-10-CM

## 2022-10-16 DIAGNOSIS — Z6835 Body mass index (BMI) 35.0-35.9, adult: Secondary | ICD-10-CM

## 2022-10-16 DIAGNOSIS — E7800 Pure hypercholesterolemia, unspecified: Secondary | ICD-10-CM | POA: Diagnosis not present

## 2022-10-16 DIAGNOSIS — F418 Other specified anxiety disorders: Secondary | ICD-10-CM

## 2022-10-16 DIAGNOSIS — E6609 Other obesity due to excess calories: Secondary | ICD-10-CM

## 2022-10-16 DIAGNOSIS — R7301 Impaired fasting glucose: Secondary | ICD-10-CM | POA: Diagnosis not present

## 2022-10-16 DIAGNOSIS — N898 Other specified noninflammatory disorders of vagina: Secondary | ICD-10-CM

## 2022-10-16 DIAGNOSIS — E559 Vitamin D deficiency, unspecified: Secondary | ICD-10-CM
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
## 2022-10-16 NOTE — Assessment & Plan Note (Signed)
Ongoing, last labs.  Continue Vitamin D3 2000 units daily.  Recheck level today.

## 2022-10-16 NOTE — Assessment & Plan Note (Signed)
Chronic, stable.  BP  well below goal in office today.  Continue Lisinopril 10 MG daily and adjust as needed.  Continue to monitor BP at home and document + focus on DASH diet.  LABS: CBC, CMP, TSH. Could consider reduction to discontinuation of Lisinopril next visits if remains under good control.  Return in 6 months.

## 2022-10-16 NOTE — Assessment & Plan Note (Signed)
ASCVD 6.1%.  Discussed with patient and educated.  IF LDL >190 or ASCVD 7% or greater will start statin, at this time recheck lipid panel and continue diet focus.

## 2022-10-16 NOTE — Assessment & Plan Note (Signed)
BMI 36.09.  Recommended eating smaller high protein, low fat meals more frequently and exercising 30 mins a day 5 times a week with a goal of 10-15lb weight loss in the next 3 months. Patient voiced their understanding and motivation to adhere to these recommendations.

## 2022-10-16 NOTE — Assessment & Plan Note (Signed)
Ongoing, stable with occasional Buspar.  She has seen improvement with this.  Continue current medication regimen and adjust as needed.  Denies SI/HI.  Return to office in 6 months.

## 2022-10-16 NOTE — Assessment & Plan Note (Signed)
Recent A1c 5.5%, improved, recheck today.  Focus on diet and regular activity.

## 2022-10-16 NOTE — Assessment & Plan Note (Signed)
Chronic, stable.  Continue Protonix PRN, which has offered benefit to her GERD symptoms.  Return in 6 months.

## 2022-10-17 LAB — COMPREHENSIVE METABOLIC PANEL
ALT: 12 IU/L (ref 0–32)
AST: 15 IU/L (ref 0–40)
Albumin/Globulin Ratio: 2.1 (ref 1.2–2.2)
Albumin: 4.6 g/dL (ref 3.9–4.9)
Alkaline Phosphatase: 56 IU/L (ref 44–121)
BUN/Creatinine Ratio: 18 (ref 12–28)
BUN: 14 mg/dL (ref 8–27)
Bilirubin Total: 0.2 mg/dL (ref 0.0–1.2)
CO2: 21 mmol/L (ref 20–29)
Calcium: 9.7 mg/dL (ref 8.7–10.3)
Chloride: 102 mmol/L (ref 96–106)
Creatinine, Ser: 0.76 mg/dL (ref 0.57–1.00)
Globulin, Total: 2.2 g/dL (ref 1.5–4.5)
Glucose: 98 mg/dL (ref 70–99)
Potassium: 4.4 mmol/L (ref 3.5–5.2)
Sodium: 140 mmol/L (ref 134–144)
Total Protein: 6.8 g/dL (ref 6.0–8.5)
eGFR: 89 mL/min/{1.73_m2} (ref >59)

## 2022-10-17 LAB — CBC WITH DIFFERENTIAL/PLATELET
Basophils Absolute: 0 10*3/uL (ref 0.0–0.2)
Basos: 1 %
EOS (ABSOLUTE): 0.2 10*3/uL (ref 0.0–0.4)
Eos: 2 %
Hematocrit: 40.6 % (ref 34.0–46.6)
Hemoglobin: 13.8 g/dL (ref 11.1–15.9)
Immature Grans (Abs): 0 10*3/uL (ref 0.0–0.1)
Immature Granulocytes: 0 %
Lymphocytes Absolute: 2.8 10*3/uL (ref 0.7–3.1)
Lymphs: 35 %
MCH: 29.9 pg (ref 26.6–33.0)
MCHC: 34 g/dL (ref 31.5–35.7)
MCV: 88 fL (ref 79–97)
Monocytes Absolute: 0.7 10*3/uL (ref 0.1–0.9)
Monocytes: 9 %
Neutrophils Absolute: 4.4 10*3/uL (ref 1.4–7.0)
Neutrophils: 53 %
Platelets: 221 10*3/uL (ref 150–450)
RBC: 4.61 x10E6/uL (ref 3.77–5.28)
RDW: 12.2 % (ref 11.7–15.4)
WBC: 8.2 10*3/uL (ref 3.4–10.8)

## 2022-10-17 LAB — HEMOGLOBIN A1C
Est. average glucose Bld gHb Est-mCnc: 120 mg/dL
Hgb A1c MFr Bld: 5.8 % — ABNORMAL HIGH (ref 4.8–5.6)

## 2022-10-17 LAB — TSH: TSH: 1.33 u[IU]/mL (ref 0.450–4.500)

## 2022-10-17 LAB — LIPID PANEL W/O CHOL/HDL RATIO
Cholesterol, Total: 251 mg/dL — ABNORMAL HIGH (ref 100–199)
HDL: 41 mg/dL (ref >39)
LDL Chol Calc (NIH): 175 mg/dL — ABNORMAL HIGH (ref 0–99)
Triglycerides: 185 mg/dL — ABNORMAL HIGH (ref 0–149)
VLDL Cholesterol Cal: 35 mg/dL (ref 5–40)

## 2022-10-17 LAB — VITAMIN D 25 HYDROXY (VIT D DEFICIENCY, FRACTURES): Vit D, 25-Hydroxy: 21.9 ng/mL — ABNORMAL LOW (ref 30.0–100.0)

## 2022-10-19 NOTE — Progress Notes (Signed)
Good afternoon, please let Narda Rutherford know labs have returned: - A1c remains in prediabetic range at 5.8%, continue focus on healthy diet and regular exercise. - Cholesterol labs remain elevated, slightly more then previous, however we can continue diet focus at this time + I would recommend taking fish oil or Omega 3 daily as supplement to help lower levels. - Vitamin D remains low, I highly recommend taking Vitamin D3 2000 units daily if you are not doing this.  If you are taking this I may send in a higher weekly dose to take to help get these levels up. - Kidney and liver function are stable and CBC shows no anemia or infection. Thyroid normal. - The wet prep is not totally resulted yet, but bacterial vaginosis is negative:) Great news!! Any questions? Keep being amazing!!  Thank you for allowing me to participate in your care.  I appreciate you. Kindest regards, Landynn Dupler

## 2022-10-20 ENCOUNTER — Other Ambulatory Visit: Payer: BC Managed Care – PPO

## 2022-10-20 LAB — NUSWAB VAGINITIS PLUS (VG+)
Candida albicans, NAA: NEGATIVE
Candida glabrata, NAA: NEGATIVE
Chlamydia trachomatis, NAA: NEGATIVE
Neisseria gonorrhoeae, NAA: NEGATIVE
Trich vag by NAA: NEGATIVE

## 2022-10-29 ENCOUNTER — Other Ambulatory Visit: Payer: Self-pay | Admitting: Nurse Practitioner

## 2022-10-29 NOTE — Telephone Encounter (Signed)
Requested Prescriptions  Pending Prescriptions Disp Refills   busPIRone (BUSPAR) 5 MG tablet [Pharmacy Med Name: BUSPIRONE HCL 5 MG TABLET] 180 tablet 1    Sig: TAKE 1 TABLET BY MOUTH 2 TIMES DAILY AS NEEDED.     Psychiatry: Anxiolytics/Hypnotics - Non-controlled Passed - 10/29/2022  1:47 AM      Passed - Valid encounter within last 12 months    Recent Outpatient Visits           1 week ago Primary hypertension   [place], [place] T, NP   3 weeks ago Bacterial vaginosis   [place], NP   2 months ago Bacterial vaginosis   [place], Barbaraann Faster, NP   5 months ago Primary hypertension   [place], Barbaraann Faster, NP   10 months ago Vaginal yeast infection   [place], Barb Merino, DO       Future Appointments             In 5 months Cannady, Barbaraann Faster, NP [place]

## 2022-11-03 ENCOUNTER — Other Ambulatory Visit: Payer: Self-pay | Admitting: Nurse Practitioner

## 2022-11-03 DIAGNOSIS — I10 Essential (primary) hypertension: Secondary | ICD-10-CM

## 2022-11-03 NOTE — Telephone Encounter (Signed)
Requested Prescriptions  Refused Prescriptions Disp Refills   lisinopril (ZESTRIL) 10 MG tablet [Pharmacy Med Name: LISINOPRIL 10 MG TABLET] 90 tablet 4    Sig: TAKE 1 TABLET BY MOUTH EVERY DAY     Cardiovascular:  ACE Inhibitors Passed - 11/03/2022  1:45 AM      Passed - Cr in normal range and within 180 days    Creatinine, Ser  Date Value Ref Range Status  10/16/2022 0.76 0.57 - 1.00 mg/dL Final         Passed - K in normal range and within 180 days    Potassium  Date Value Ref Range Status  10/16/2022 4.4 3.5 - 5.2 mmol/L Final         Passed - Patient is not pregnant      Passed - Last BP in normal range    BP Readings from Last 1 Encounters:  10/16/22 116/68         Passed - Valid encounter within last 6 months    Recent Outpatient Visits           2 weeks ago Primary hypertension   [place], Henrine Screws T, NP   4 weeks ago Bacterial vaginosis   [place], NP   2 months ago Bacterial vaginosis   [place], [place] T, NP   5 months ago Primary hypertension   [place], [place] T, NP   10 months ago Vaginal yeast infection   [place], [place], DO       Future Appointments             In 5 months Cannady, Barbaraann Faster, NP [place]

## 2022-11-07 ENCOUNTER — Other Ambulatory Visit: Payer: Self-pay | Admitting: Nurse Practitioner

## 2022-11-07 DIAGNOSIS — I10 Essential (primary) hypertension: Secondary | ICD-10-CM

## 2022-11-09 NOTE — Telephone Encounter (Signed)
Last RF 09/24/21 #90 4 RF (should have enough until 12/24/22)  Requested Prescriptions  Refused Prescriptions Disp Refills   lisinopril (ZESTRIL) 10 MG tablet [Pharmacy Med Name: LISINOPRIL 10 MG TABLET] 90 tablet 4    Sig: TAKE 1 TABLET BY MOUTH EVERY DAY     Cardiovascular:  ACE Inhibitors Passed - 11/07/2022 10:19 AM      Passed - Cr in normal range and within 180 days    Creatinine, Ser  Date Value Ref Range Status  10/16/2022 0.76 0.57 - 1.00 mg/dL Final         Passed - K in normal range and within 180 days    Potassium  Date Value Ref Range Status  10/16/2022 4.4 3.5 - 5.2 mmol/L Final         Passed - Patient is not pregnant      Passed - Last BP in normal range    BP Readings from Last 1 Encounters:  10/16/22 116/68         Passed - Valid encounter within last 6 months    Recent Outpatient Visits           3 weeks ago Primary hypertension   [place], Henrine Screws T, NP   1 month ago Bacterial vaginosis   [place], NP   2 months ago Bacterial vaginosis   [place], [place] T, NP   5 months ago Primary hypertension   [place], [place] T, NP   10 months ago Vaginal yeast infection   [place], [place], DO       Future Appointments             In 5 months Cannady, Barbaraann Faster, NP [place]

## 2022-11-12 ENCOUNTER — Emergency Department
Admission: EM | Admit: 2022-11-12 | Discharge: 2022-11-12 | Disposition: A | Discharge status: Home / Self Care | Payer: BC Managed Care – PPO | Attending: Emergency Medicine | Admitting: Emergency Medicine

## 2022-11-12 ENCOUNTER — Emergency Department: Payer: BC Managed Care – PPO

## 2022-11-12 ENCOUNTER — Other Ambulatory Visit: Payer: Self-pay

## 2022-11-12 DIAGNOSIS — M79602 Pain in left arm: Secondary | ICD-10-CM | POA: Insufficient documentation

## 2022-11-12 DIAGNOSIS — I10 Essential (primary) hypertension: Secondary | ICD-10-CM | POA: Diagnosis not present

## 2022-11-12 DIAGNOSIS — X509XXA Other and unspecified overexertion or strenuous movements or postures, initial encounter: Secondary | ICD-10-CM | POA: Diagnosis not present

## 2022-11-12 DIAGNOSIS — R0789 Other chest pain: Secondary | ICD-10-CM | POA: Diagnosis not present

## 2022-11-12 DIAGNOSIS — M79622 Pain in left upper arm: Secondary | ICD-10-CM | POA: Diagnosis not present

## 2022-11-12 DIAGNOSIS — Y990 Civilian activity done for income or pay: Secondary | ICD-10-CM | POA: Diagnosis not present

## 2022-11-12 LAB — CBC
HCT: 41.2 % (ref 36.0–46.0)
Hemoglobin: 13.5 g/dL (ref 12.0–15.0)
MCH: 29.7 pg (ref 26.0–34.0)
MCHC: 32.8 g/dL (ref 30.0–36.0)
MCV: 90.5 fL (ref 80.0–100.0)
Platelets: 206 10*3/uL (ref 150–400)
RBC: 4.55 MIL/uL (ref 3.87–5.11)
RDW: 12.9 % (ref 11.5–15.5)
WBC: 7.6 10*3/uL (ref 4.0–10.5)
nRBC: 0 % (ref 0.0–0.2)

## 2022-11-12 LAB — BASIC METABOLIC PANEL
Anion gap: 10 (ref 5–15)
BUN: 17 mg/dL (ref 8–23)
CO2: 24 mmol/L (ref 22–32)
Calcium: 9.3 mg/dL (ref 8.9–10.3)
Chloride: 105 mmol/L (ref 98–111)
Creatinine, Ser: 0.79 mg/dL (ref 0.44–1.00)
GFR, Estimated: >60 mL/min (ref >60)
Glucose, Bld: 94 mg/dL (ref 70–99)
Potassium: 4.3 mmol/L (ref 3.5–5.1)
Sodium: 139 mmol/L (ref 135–145)

## 2022-11-12 LAB — TROPONIN I (HIGH SENSITIVITY): Troponin I (High Sensitivity): 2 ng/L (ref <18)

## 2022-11-12 NOTE — ED Provider Notes (Signed)
[facility] Provider Note    Event Date/Time   First MD Initiated Contact with Patient 11/12/22 1059     (approximate)  History   Chief Complaint: Arm Pain  HPI  Stacie Bell is a 62 y.o. female with a past medical history of hypertension, hyperlipidemia presents to the emergency department for left arm discomfort.  According to the patient over the past 2 days or so she has been experiencing pain in the left arm.  States she has been using a heating pad without relief.  Patient denies any pain with movement.  States the pain feels like it is deep in the arm.  Patient states she is most concerned about her heart although denies any chest pain.  No history of DVT.  No shortness of breath.  No cough or fever.  Physical Exam   Triage Vital Signs: ED Triage Vitals [11/12/22 1058]  Enc Vitals Group     BP (!) 138/106     Pulse Rate 89     Resp 16     Temp 97.8 F (36.6 C)     Temp Source Oral     SpO2 96 %     Weight 216 lb 14.9 oz (98.4 kg)     Height '5\' 5"'$  (1.651 m)     Head Circumference      Peak Flow      Pain Score 5     Pain Loc      Pain Edu?      Excl. in [place]?     Most recent vital signs: Vitals:   11/12/22 1058  BP: (!) 138/106  Pulse: 89  Resp: 16  Temp: 97.8 F (36.6 C)  SpO2: 96%    General: Awake, no distress.  CV:  Good peripheral perfusion.  Regular rate and rhythm  Resp:  Normal effort.  Equal breath sounds bilaterally.  Abd:  No distention.  Soft, nontender.  No rebound or guarding. Other:  2+ radial pulse, good grip strengths.  No pronator drift.  Cranial nerves intact.   ED Results / Procedures / Treatments   EKG  EKG viewed and interpreted by myself shows a normal sinus rhythm at 67 bpm, normal axis, normal intervals, no concerning ST changes.  RADIOLOGY  Ultrasound is negative for DVT.   MEDICATIONS ORDERED IN ED: Medications - No data to display   IMPRESSION / MDM / [place] / ED COURSE  I  reviewed the triage vital signs and the nursing notes.  Patient's presentation is most consistent with acute presentation with potential threat to life or bodily function.  Patient presents emergency department for left arm discomfort for the past 2 days.  Patient states it feels like it is a deeper pain although does admit that she does a lot of pushing and pulling at work and she may have injured it.  No tenderness to palpation or extension or flexion.  Good range of motion in all the joints.  Given the patient's concern we will obtain labs including a cardiac enzyme and an EKG.  Given report of pain in the left arm without reproducibility with palpation or movement will obtain an ultrasound rule out DVT.  Patient agreeable to plan of care.  Patient's workup is reassuring.  Ultrasounds negative for DVT.  Patient's labs including her CBC is normal, chemistry is normal, troponin is normal.  Patient EKG is reassuring.  Given the patient's reassuring workup I believe the patient is safe for  discharge home.  I did discuss follow-up with her PCP as well as provided my typical chest pain return precautions.  Patient agreeable to plan.  FINAL CLINICAL IMPRESSION(S) / ED DIAGNOSES   Musculoskeletal pain    Note:  This document was prepared using Dragon voice recognition software and may include unintentional dictation errors.   Harvest Dark, MD 11/12/22 1230

## 2022-11-12 NOTE — ED Triage Notes (Signed)
Pt here with left arm pain x2 days. Pt states she was pulling something at work and then had pain her arm. Pt states the pain is in her shoulder going to her elbow and fingers. Pt denies injury.

## 2022-11-20 ENCOUNTER — Other Ambulatory Visit: Payer: Self-pay | Admitting: Nurse Practitioner

## 2022-11-20 DIAGNOSIS — I10 Essential (primary) hypertension: Secondary | ICD-10-CM

## 2022-11-20 MED ORDER — LISINOPRIL 10 MG PO TABS: 10.0000 mg | ORAL_TABLET | Freq: Every day | ORAL | 4 refills | Status: DC

## 2022-11-20 NOTE — Telephone Encounter (Signed)
Medication refill for Lisinopril last ov 10/16/22, upcoming ov 04/19/23 . Please advise

## 2022-11-20 NOTE — Telephone Encounter (Signed)
Copied from [place] [phone]. Topic: General - Other >> Nov 20, 2022  3:56 PM Everette C wrote: Reason for CRM: The patient has called to follow up on their previous lisinopril (ZESTRIL) 10 MG tablet [id number] refill request   The patient has 3 tablets remaining and would like confirmation of their refill when possible   Please contact the patient further when possible regarding their refill

## 2023-01-06 ENCOUNTER — Ambulatory Visit: Payer: BC Managed Care – PPO | Admitting: Nurse Practitioner

## 2023-01-06 ENCOUNTER — Encounter: Payer: Self-pay | Admitting: Nurse Practitioner

## 2023-01-06 VITALS — BP 110/74 | HR 76 | Temp 98.1°F | Ht 65.0 in | Wt 215.8 lb

## 2023-01-06 DIAGNOSIS — J3489 Other specified disorders of nose and nasal sinuses: Secondary | ICD-10-CM | POA: Diagnosis not present

## 2023-01-06 MED ORDER — PREDNISONE 20 MG PO TABS: 40.0000 mg | ORAL_TABLET | Freq: Every day | ORAL | 0 refills | Status: AC

## 2023-01-06 NOTE — Progress Notes (Signed)
BP 110/74   Pulse 76   Temp 98.1 F (36.7 C) (Oral)   Ht 5\' 5"  (1.651 m)   Wt 215 lb 12.8 oz (97.9 kg)   LMP  (LMP Unknown)   SpO2 95%   BMI 35.91 kg/m    Subjective:    Patient ID: Stacie Bell, female    DOB: [birth year], 62 y.o.   MRN: [id number]  HPI: Stacie Bell is a 62 y.o. female  Chief Complaint  Patient presents with   Ear Pain    Left ear pain along with pressure on left side of head, with a small headache, started this weekend   UPPER RESPIRATORY TRACT INFECTION Started this weekend with symptoms, pressure and headache to left side of head + ear pain.  No Covid testing done at home.  Goes on vacation next week. Fever: no Cough: no Shortness of breath: no Wheezing: no Chest pain: no Chest tightness: no Chest congestion: no Nasal congestion: yes Runny nose: no Post nasal drip: yes Sneezing: no Sore throat: yes in morning  time Swollen glands: no Sinus pressure: yes Headache: yes Face pain: yes Toothache: no Ear pain: yes left Ear pressure: yes left Eyes red/itching:no Eye drainage/crusting: no  Vomiting: no Rash: no Fatigue: yes Sick contacts: no Strep contacts: no  Context: fluctuating Recurrent sinusitis: no Relief with OTC cold/cough medications: no  Treatments attempted: none   Relevant past medical, surgical, family and social history reviewed and updated as indicated. Interim medical history since our last visit reviewed. Allergies and medications reviewed and updated.  Review of Systems  Constitutional:  Positive for fatigue. Negative for activity change, appetite change, chills and fever.  HENT:  Positive for congestion, ear pain, postnasal drip, sinus pressure and sore throat. Negative for ear discharge, facial swelling, rhinorrhea, sinus pain, sneezing and voice change.   Respiratory:  Negative for cough, chest tightness, shortness of breath and wheezing.   Cardiovascular:  Negative for chest pain, palpitations and leg  swelling.  Neurological:  Positive for headaches. Negative for dizziness and numbness.  Psychiatric/Behavioral: Negative.      Per HPI unless specifically indicated above     Objective:    BP 110/74   Pulse 76   Temp 98.1 F (36.7 C) (Oral)   Ht 5\' 5"  (1.651 m)   Wt 215 lb 12.8 oz (97.9 kg)   LMP  (LMP Unknown)   SpO2 95%   BMI 35.91 kg/m   Wt Readings from Last 3 Encounters:  01/06/23 215 lb 12.8 oz (97.9 kg)  11/12/22 216 lb 14.9 oz (98.4 kg)  10/16/22 216 lb 14.4 oz (98.4 kg)    Physical Exam Vitals and nursing note reviewed.  Constitutional:      General: She is awake. She is not in acute distress.    Appearance: She is well-developed and well-groomed. She is obese. She is not ill-appearing or toxic-appearing.  HENT:     Head: Normocephalic.     Right Ear: Hearing, ear canal and external ear normal. A middle ear effusion is present. Tympanic membrane is not injected or perforated.     Left Ear: Hearing, ear canal and external ear normal. A middle ear effusion is present. Tympanic membrane is not injected or perforated.     Ears:     Comments: Very mild erythema in left canal. Scant cerumen both ears.    Nose: Rhinorrhea present. Rhinorrhea is clear.     Right Sinus: No maxillary sinus tenderness or frontal sinus  tenderness.     Left Sinus: No maxillary sinus tenderness or frontal sinus tenderness.     Mouth/Throat:     Mouth: Mucous membranes are moist.     Pharynx: Posterior oropharyngeal erythema present. No pharyngeal swelling or oropharyngeal exudate.  Eyes:     General: Lids are normal.        Right eye: No discharge.        Left eye: No discharge.     Conjunctiva/sclera: Conjunctivae normal.     Pupils: Pupils are equal, round, and reactive to light.  Neck:     Thyroid: No thyromegaly.     Vascular: No carotid bruit.  Cardiovascular:     Rate and Rhythm: Normal rate and regular rhythm.     Heart sounds: Normal heart sounds. No murmur heard.    No  gallop.  Pulmonary:     Effort: Pulmonary effort is normal. No accessory muscle usage or respiratory distress.     Breath sounds: No decreased breath sounds, wheezing or rhonchi.  Abdominal:     General: Bowel sounds are normal.     Palpations: Abdomen is soft. There is no hepatomegaly or splenomegaly.  Musculoskeletal:     Cervical back: Normal range of motion and neck supple.     Right lower leg: No edema.     Left lower leg: No edema.  Lymphadenopathy:     Head:     Right side of head: No submental, submandibular, tonsillar, preauricular or posterior auricular adenopathy.     Left side of head: Posterior auricular adenopathy present. No submental, submandibular, tonsillar or preauricular adenopathy.     Cervical: No cervical adenopathy.  Skin:    General: Skin is warm and dry.  Neurological:     Mental Status: She is alert and oriented to person, place, and time.  Psychiatric:        Attention and Perception: Attention normal.        Mood and Affect: Mood normal.        Speech: Speech normal.        Behavior: Behavior normal. Behavior is cooperative.        Thought Content: Thought content normal.    Results for orders placed or performed during the hospital encounter of 11/12/22  CBC  Result Value Ref Range   WBC 7.6 4.0 - 10.5 K/uL   RBC 4.55 3.87 - 5.11 MIL/uL   Hemoglobin 13.5 12.0 - 15.0 g/dL   HCT 16.1 09.6 - 04.5 %   MCV 90.5 80.0 - 100.0 fL   MCH 29.7 26.0 - 34.0 pg   MCHC 32.8 30.0 - 36.0 g/dL   RDW 40.9 81.1 - 91.4 %   Platelets 206 150 - 400 K/uL   nRBC 0.0 0.0 - 0.2 %  Basic metabolic panel  Result Value Ref Range   Sodium 139 135 - 145 mmol/L   Potassium 4.3 3.5 - 5.1 mmol/L   Chloride 105 98 - 111 mmol/L   CO2 24 22 - 32 mmol/L   Glucose, Bld 94 70 - 99 mg/dL   BUN 17 8 - 23 mg/dL   Creatinine, Ser 7.82 0.44 - 1.00 mg/dL   Calcium 9.3 8.9 - 95.6 mg/dL   GFR, Estimated >21 >30 mL/min   Anion gap 10 5 - 15  Troponin I (High Sensitivity)  Result  Value Ref Range   Troponin I (High Sensitivity) 2 <18 ng/L      Assessment & Plan:   Problem List Items  Addressed This Visit       Other   Sinus pressure - Primary    Acute for 5 days, refuses Covid testing.  At this time no abx treatment due to length of symptoms <7 days, discussed with her.  Start Prednisone 40 MG daily for 5 days and recommend she take OTC antihistamine like Claritin daily for next 7 days.  Recommend: - Increased rest - Increasing Fluids - Acetaminophen as needed for fever/pain.  - Mucinex.  - Saline sinus flushes or a neti pot.  - Humidifying the air.  If no improvement by Friday, going into weekend, then call office and will send in abx therapy.  At this time trial simple treatment.        Follow up plan: Return if symptoms worsen or fail to improve.

## 2023-01-06 NOTE — Patient Instructions (Signed)
Sinus Pain  Sinus pain happens when your sinuses get swollen or blocked (clogged). Sinuses are spaces behind the bones of your face and forehead. You may feel pain or pressure in your face, forehead, ears, or upper teeth. Sinus pain can be mild or very bad. What are the causes? Sinus pain can result from conditions that affect your sinuses. Common causes include: Colds. Sinus infections. Allergies. What are the signs or symptoms? The main symptom of this condition is pain or pressure in your face, forehead, ears, or upper teeth. People who have sinus pain often have other symptoms, such as: Stuffed up or runny nose. Fever. Not being able to smell. Headache. Weather changes can make your symptoms worse. How is this treated? Treatment for this condition depends on the cause. Sinus pain caused by: A sinus infection may be treated with antibiotic medicine. A stuffy nose may be helped by rinsing out the nose and sinuses with a salt water solution. Allergies may be helped by allergy medicines and nasal sprays. Surgery may be needed in some cases if other treatments do not help. Follow these instructions at home: General instructions If told: Apply a warm, moist washcloth to your face. This can help to lessen pain. Use a nasal salt water wash. Follow the directions on the bottle or box. Hydrate and humidify Drink enough water to keep your pee (urine) pale yellow. Use a humidifier if your home is dry. Breathe in steam for 10-15 minutes, 3-4 times a day or as told by your doctor. You can do this in the bathroom while a hot shower is running. Try not to spend time in cool or dry air. Medicines  Take over-the-counter and prescription medicines only as told by your doctor. If you were prescribed an antibiotic medicine, take it as told by your doctor. Do not stop taking it even if you start to feel better. Use a nose spray if your nose feels full of mucus (congested). Contact a doctor if: You  get more than one headache a week. Light or sound bothers you. You have a fever. You feel sick to your stomach (nauseous) or you vomit. Your headaches do not get better with treatment. Get help right away if: You have trouble seeing. You suddenly have very bad pain in your face or head. You start to have quick, sudden movements or shaking that you cannot control (seizure). You are confused. You have a stiff neck. Summary Sinus pain happens when your sinuses get swollen or blocked (clogged). Sinuses are spaces behind the bones of your face and forehead. You may feel pain or pressure in your face, forehead, ears, or upper teeth. Take over-the-counter and prescription medicines only as told by your doctor. If told, apply a warm, moist washcloth to your face. This can help to lessen pain. This information is not intended to replace advice given to you by your health care provider. Make sure you discuss any questions you have with your health care provider. Document Revised: 07/20/2021 Document Reviewed: 07/20/2021 Elsevier Patient Education  2023 [employer].  

## 2023-01-06 NOTE — Assessment & Plan Note (Signed)
Acute for 5 days, refuses Covid testing.  At this time no abx treatment due to length of symptoms <7 days, discussed with her.  Start Prednisone 40 MG daily for 5 days and recommend she take OTC antihistamine like Claritin daily for next 7 days.  Recommend: - Increased rest - Increasing Fluids - Acetaminophen as needed for fever/pain.  - Mucinex.  - Saline sinus flushes or a neti pot.  - Humidifying the air.  If no improvement by Friday, going into weekend, then call office and will send in abx therapy.  At this time trial simple treatment.

## 2023-01-08 ENCOUNTER — Ambulatory Visit: Payer: Self-pay

## 2023-01-08 MED ORDER — CEFPODOXIME PROXETIL 100 MG PO TABS: 100.0000 mg | ORAL_TABLET | Freq: Two times a day (BID) | ORAL | 0 refills | Status: AC

## 2023-01-08 NOTE — Telephone Encounter (Signed)
Patient said she was in the office on Wednesday and provider advised if she was not feeling better by Friday to call for an antibiotic. Please advise   Chief Complaint: Sinus pain "is a little better, but my left ear is still painful." Was told to call back if no better and antibiotic would be sent in. Symptoms: Seen in office this week. Frequency: This week Pertinent Negatives: Patient denies fever Disposition: [] ED /[] Urgent Care (no appt availability in office) / [] Appointment(In office/virtual)/ []  [place] [facility]/ [] Home Care/ [] Refused Recommended Disposition /[] [place] [facility]/ [x]  Follow-up with PCP Additional Notes: Please advise pt.  Answer Assessment - Initial Assessment Questions 1. LOCATION: "Where does it hurt?"      Face, left ear 2. ONSET: "When did the sinus pain start?"  (e.g., hours, days)      This week 3. SEVERITY: "How bad is the pain?"   (Scale 1-10; mild, moderate or severe)   - MILD (1-3): doesn't interfere with normal activities    - MODERATE (4-7): interferes with normal activities (e.g., work or school) or awakens from sleep   - SEVERE (8-10): excruciating pain and patient unable to do any normal activities        Moderate 4. RECURRENT SYMPTOM: "Have you ever had sinus problems before?" If Yes, ask: "When was the last time?" and "What happened that time?"      Yes 5. NASAL CONGESTION: "Is the nose blocked?" If Yes, ask: "Can you open it or must you breathe through your mouth?"     No 6. NASAL DISCHARGE: "Do you have discharge from your nose?" If so ask, "What color?"     Clear 7. FEVER: "Do you have a fever?" If Yes, ask: "What is it, how was it measured, and when did it start?"      No 8. OTHER SYMPTOMS: "Do you have any other symptoms?" (e.g., sore throat, cough, earache, difficulty breathing)     Ear pain 9. PREGNANCY: "Is there any chance you are pregnant?" "When was your last menstrual period?"     No  Protocols used: Sinus Pain or  Congestion-A-AH

## 2023-04-19 ENCOUNTER — Ambulatory Visit: Payer: BC Managed Care – PPO | Admitting: Nurse Practitioner

## 2023-05-06 ENCOUNTER — Other Ambulatory Visit: Payer: Self-pay | Admitting: Nurse Practitioner

## 2023-05-06 NOTE — Telephone Encounter (Signed)
Requested Prescriptions  Pending Prescriptions Disp Refills   busPIRone (BUSPAR) 5 MG tablet [Pharmacy Med Name: BUSPIRONE HCL 5 MG TABLET] 180 tablet 1    Sig: TAKE 1 TABLET BY MOUTH TWICE A DAY AS NEEDED     Psychiatry: Anxiolytics/Hypnotics - Non-controlled Passed - 05/06/2023  1:33 AM      Passed - Valid encounter within last 12 months    Recent Outpatient Visits           4 months ago Sinus pressure   [facility] [place], [place] T, NP   6 months ago Primary hypertension   [facility] [place], [place] T, NP   7 months ago Bacterial vaginosis   [place] [facility] Larae Grooms, NP   8 months ago Bacterial vaginosis   [facility] [place], Dorie Rank, NP   11 months ago Primary hypertension   [place] [facility] [place], Dorie Rank, NP       Future Appointments             In 4 days [name], NP [place] [facility]

## 2023-05-09 NOTE — Patient Instructions (Signed)
Be Involved in Caring For Your Health:  Taking Medications When medications are taken as directed, they can greatly improve your health. But if they are not taken as prescribed, they may not work. In some cases, not taking them correctly can be harmful. To help ensure your treatment remains effective and safe, understand your medications and how to take them. Bring your medications to each visit for review by your provider.  Your lab results, notes, and after visit summary will be available on My Chart. We strongly encourage you to use this feature. If lab results are abnormal the clinic will contact you with the appropriate steps. If the clinic does not contact you assume the results are satisfactory. You can always view your results on My Chart. If you have questions regarding your health or results, please contact the clinic during office hours. You can also ask questions on My Chart.  We at [facility] are grateful that you chose us to provide your care. We strive to provide evidence-based and compassionate care and are always looking for feedback. If you get a survey from the clinic please complete this so we can hear your opinions.  DASH Eating Plan DASH stands for Dietary Approaches to Stop Hypertension. The DASH eating plan is a healthy eating plan that has been shown to: Lower high blood pressure (hypertension). Reduce your risk for type 2 diabetes, heart disease, and stroke. Help with weight loss. What are tips for following this plan? Reading food labels Check food labels for the amount of salt (sodium) per serving. Choose foods with less than 5 percent of the Daily Value (DV) of sodium. In general, foods with less than 300 milligrams (mg) of sodium per serving fit into this eating plan. To find whole grains, look for the word "whole" as the first word in the ingredient list. Shopping Buy products labeled as "low-sodium" or "no salt added." Buy fresh foods. Avoid canned  foods and pre-made or frozen meals. Cooking Try not to add salt when you cook. Use salt-free seasonings or herbs instead of table salt or sea salt. Check with your health care provider or pharmacist before using salt substitutes. Do not fry foods. Cook foods in healthy ways, such as baking, boiling, grilling, roasting, or broiling. Cook using oils that are good for your heart. These include olive, canola, avocado, soybean, and sunflower oil. Meal planning  Eat a balanced diet. This should include: 4 or more servings of fruits and 4 or more servings of vegetables each day. Try to fill half of your plate with fruits and vegetables. 6-8 servings of whole grains each day. 6 or less servings of lean meat, poultry, or fish each day. 1 oz is 1 serving. A 3 oz (85 g) serving of meat is about the same size as the palm of your hand. One egg is 1 oz (28 g). 2-3 servings of low-fat dairy each day. One serving is 1 cup (237 mL). 1 serving of nuts, seeds, or beans 5 times each week. 2-3 servings of heart-healthy fats. Healthy fats called omega-3 fatty acids are found in foods such as walnuts, flaxseeds, fortified milks, and eggs. These fats are also found in cold-water fish, such as sardines, salmon, and mackerel. Limit how much you eat of: Canned or prepackaged foods. Food that is high in trans fat, such as fried foods. Food that is high in saturated fat, such as fatty meat. Desserts and other sweets, sugary drinks, and other foods with added sugar. Full-fat   dairy products. Do not salt foods before eating. Do not eat more than 4 egg yolks a week. Try to eat at least 2 vegetarian meals a week. Eat more home-cooked food and less restaurant, buffet, and fast food. Lifestyle When eating at a restaurant, ask if your food can be made with less salt or no salt. If you drink alcohol: Limit how much you have to: 0-1 drink a day if you are female. 0-2 drinks a day if you are female. Know how much alcohol is in  your drink. In the [place] one drink is one 12 oz bottle of beer (355 mL), one 5 oz glass of wine (148 mL), or one 1 oz glass of hard liquor (44 mL). General information Avoid eating more than 2,300 mg of salt a day. If you have hypertension, you may need to reduce your sodium intake to 1,500 mg a day. Work with your provider to stay at a healthy body weight or lose weight. Ask what the best weight range is for you. On most days of the week, get at least 30 minutes of exercise that causes your heart to beat faster. This may include walking, swimming, or biking. Work with your provider or dietitian to adjust your eating plan to meet your specific calorie needs. What foods should I eat? Fruits All fresh, dried, or frozen fruit. Canned fruits that are in their natural juice and do not have sugar added to them. Vegetables Fresh or frozen vegetables that are raw, steamed, roasted, or grilled. Low-sodium or reduced-sodium tomato and vegetable juice. Low-sodium or reduced-sodium tomato sauce and tomato paste. Low-sodium or reduced-sodium canned vegetables. Grains Whole-grain or whole-wheat bread. Whole-grain or whole-wheat pasta. Brown rice. Oatmeal. Quinoa. Bulgur. Whole-grain and low-sodium cereals. Pita bread. Low-fat, low-sodium crackers. Whole-wheat flour tortillas. Meats and other proteins Skinless chicken or turkey. Ground chicken or turkey. Pork with fat trimmed off. Fish and seafood. Egg whites. Dried beans, peas, or lentils. Unsalted nuts, nut butters, and seeds. Unsalted canned beans. Lean cuts of beef with fat trimmed off. Low-sodium, lean precooked or cured meat, such as sausages or meat loaves. Dairy Low-fat (1%) or fat-free (skim) milk. Reduced-fat, low-fat, or fat-free cheeses. Nonfat, low-sodium ricotta or cottage cheese. Low-fat or nonfat yogurt. Low-fat, low-sodium cheese. Fats and oils Soft margarine without trans fats. Vegetable oil. Reduced-fat, low-fat, or light mayonnaise and salad  dressings (reduced-sodium). Canola, safflower, olive, avocado, soybean, and sunflower oils. Avocado. Seasonings and condiments Herbs. Spices. Seasoning mixes without salt. Other foods Unsalted popcorn and pretzels. Fat-free sweets. The items listed above may not be all the foods and drinks you can have. Talk to a dietitian to learn more. What foods should I avoid? Fruits Canned fruit in a light or heavy syrup. Fried fruit. Fruit in cream or butter sauce. Vegetables Creamed or fried vegetables. Vegetables in a cheese sauce. Regular canned vegetables that are not marked as low-sodium or reduced-sodium. Regular canned tomato sauce and paste that are not marked as low-sodium or reduced-sodium. Regular tomato and vegetable juices that are not marked as low-sodium or reduced-sodium. Pickles. Olives. Grains Baked goods made with fat, such as croissants, muffins, or some breads. Dry pasta or rice meal packs. Meats and other proteins Fatty cuts of meat. Ribs. Fried meat. Bacon. Bologna, salami, and other precooked or cured meats, such as sausages or meat loaves, that are not lean and low in sodium. Fat from the back of a pig (fatback). Bratwurst. Salted nuts and seeds. Canned beans with added salt. Canned   or smoked fish. Whole eggs or egg yolks. Chicken or turkey with skin. Dairy Whole or 2% milk, cream, and half-and-half. Whole or full-fat cream cheese. Whole-fat or sweetened yogurt. Full-fat cheese. Nondairy creamers. Whipped toppings. Processed cheese and cheese spreads. Fats and oils Butter. Stick margarine. Lard. Shortening. Ghee. Bacon fat. Tropical oils, such as coconut, palm kernel, or palm oil. Seasonings and condiments Onion salt, garlic salt, seasoned salt, table salt, and sea salt. Worcestershire sauce. Tartar sauce. Barbecue sauce. Teriyaki sauce. Soy sauce, including reduced-sodium soy sauce. Steak sauce. Canned and packaged gravies. Fish sauce. Oyster sauce. Cocktail sauce. Store-bought  horseradish. Ketchup. Mustard. Meat flavorings and tenderizers. Bouillon cubes. Hot sauces. Pre-made or packaged marinades. Pre-made or packaged taco seasonings. Relishes. Regular salad dressings. Other foods Salted popcorn and pretzels. The items listed above may not be all the foods and drinks you should avoid. Talk to a dietitian to learn more. Where to find more information National Heart, Lung, and Blood Institute (NHLBI): nhlbi.nih.gov American Heart Association (AHA): heart.org Academy of Nutrition and Dietetics: eatright.org National Kidney Foundation (NKF): kidney.org This information is not intended to replace advice given to you by your health care provider. Make sure you discuss any questions you have with your health care provider. Document Revised: 09/03/2022 Document Reviewed: 09/03/2022 Elsevier Patient Education  2024 Elsevier Inc.  

## 2023-05-10 ENCOUNTER — Ambulatory Visit: Payer: BC Managed Care – PPO | Admitting: Nurse Practitioner

## 2023-05-10 ENCOUNTER — Encounter: Payer: Self-pay | Admitting: Nurse Practitioner

## 2023-05-10 VITALS — BP 108/72 | HR 79 | Temp 98.4°F | Ht 65.0 in | Wt 213.2 lb

## 2023-05-10 DIAGNOSIS — R7301 Impaired fasting glucose: Secondary | ICD-10-CM | POA: Diagnosis not present

## 2023-05-10 DIAGNOSIS — F418 Other specified anxiety disorders: Secondary | ICD-10-CM | POA: Diagnosis not present

## 2023-05-10 DIAGNOSIS — Z23 Encounter for immunization: Secondary | ICD-10-CM

## 2023-05-10 DIAGNOSIS — Z6835 Body mass index (BMI) 35.0-35.9, adult: Secondary | ICD-10-CM

## 2023-05-10 DIAGNOSIS — E7800 Pure hypercholesterolemia, unspecified: Secondary | ICD-10-CM

## 2023-05-10 DIAGNOSIS — I10 Essential (primary) hypertension: Secondary | ICD-10-CM

## 2023-05-10 DIAGNOSIS — R42 Dizziness and giddiness: Secondary | ICD-10-CM

## 2023-05-10 DIAGNOSIS — E6609 Other obesity due to excess calories: Secondary | ICD-10-CM

## 2023-05-10 LAB — BAYER DCA HB A1C WAIVED: HB A1C (BAYER DCA - WAIVED): 5.5 % (ref 4.8–5.6)

## 2023-05-10 NOTE — Assessment & Plan Note (Signed)
A1c 5.5% today, much improved.  Focus on diet and regular activity.

## 2023-05-10 NOTE — Assessment & Plan Note (Signed)
BMI 35.48.  Recommended eating smaller high protein, low fat meals more frequently and exercising 30 mins a day 5 times a week with a goal of 10-15lb weight loss in the next 3 months. Patient voiced their understanding and motivation to adhere to these recommendations.

## 2023-05-10 NOTE — Assessment & Plan Note (Signed)
Ongoing, stable with occasional Buspar.  She has seen improvement with this.  Continue current medication regimen and adjust as needed.  Denies SI/HI.  Return to office in 6 months. 

## 2023-05-10 NOTE — Assessment & Plan Note (Signed)
Acute, only while at work.  Suspect some dehydration or low sugars as workplace has no air conditioning, only fans.  Has short breaks only.  Recommend she use electrolyte drinks at work and ensure drinking fluids every hour + having a snack on breaks.  Neuro exam reassuring.  No red flags.  Return if worsening or ongoing.  Labs today.

## 2023-05-10 NOTE — Assessment & Plan Note (Signed)
Chronic, stable.  BP is well below goal in office today, recommend she check at home to ensure it is not getting too low.  Continue Lisinopril 10 MG daily at this time and adjust as needed.  Continue to monitor BP at home and document + focus on DASH diet.  LABS: CBC, CMP, TSH. Could consider reduction to discontinuation of Lisinopril next visits if remains under good control.  Return in 6 months.

## 2023-05-10 NOTE — Progress Notes (Signed)
BP 108/72   Pulse 79   Temp 98.4 F (36.9 C) (Oral)   Ht 5\' 5"  (1.651 m)   Wt 213 lb 3.2 oz (96.7 kg)   LMP  (LMP Unknown)   SpO2 96%   BMI 35.48 kg/m    Subjective:    Patient ID: Stacie Bell, female    DOB: 1961/08/09, 62 y.o.   MRN: [id number]  HPI: Stacie Bell is a 62 y.o. female  Chief Complaint  Patient presents with   Depression   Hyperlipidemia   Hypertension   Dizziness    Pt states she has been experiencing lightheadedness for the last 2 weeks. States it is not constant, states she has felt it about 3 to 4 times in the last few weeks    HYPERTENSION  Continues on Lisinopril 10 MG.  Hypertension status: controlled  Satisfied with current treatment? yes Duration of hypertension: chronic BP monitoring frequency: not often BP range:  BP medication side effects:  no Medication compliance: good compliance Previous BP meds: none Aspirin: yes Recurrent headaches: no Visual changes: no Palpitations: no Dyspnea: no Chest pain: no Lower extremity edema: occasional to left foot, h/o fracture Dizzy/lightheaded: yes The 10-year ASCVD risk score (Arnett DK, et al., 2019) is: 5%   Values used to calculate the score:     Age: 60 years     Sex: Female     Is Non-Hispanic African American: No     Diabetic: No     Tobacco smoker: No     Systolic Blood Pressure: 108 mmHg     Is BP treated: Yes     HDL Cholesterol: 41 mg/dL     Total Cholesterol: 251 mg/dL  DIZZINESS Started over past two weeks, not every day -- 3-4 times in last two weeks.  Will be standing and working, then dizziness will hit her.  Stands for 8 hours at work.  Is hydrating as much as she can at work, is hot in there due to no air conditioning -- have fans.  Has had Covid going through workplace too. Does not happen at home.    Eats at 3:30 before work, then break time at 8:30 am -- often just eats a sandwich. Duration: days Description of symptoms: ill-defined -- weird sensation Duration  of episode: less then one hour Dizziness frequency: recurrent Provoking factors: none Aggravating factors:  none Triggered by rolling over in bed: no Triggered by bending over: no Aggravated by head movement:  sometimes Aggravated by exertion, coughing, loud noises: no Recent head injury: no Recent or current viral symptoms:  had a runny nose History of vasovagal episodes: no Nausea: no Vomiting: no Tinnitus: no Hearing loss: no Aural fullness: no Headache: no Photophobia/phonophobia: no Unsteady gait: no Postural instability: no Diplopia, dysarthria, dysphagia or weakness: no Related to exertion: no Pallor: no Diaphoresis: no Dyspnea: no Chest pain: no   Impaired Fasting Glucose HbA1C:  Lab Results  Component Value Date   HGBA1C 5.8 (H) 10/16/2022  Polydipsia: no Polyuria: no Weight change: no Visual disturbance: no Glucose Monitoring: no    Accucheck frequency: Not Checking    Fasting glucose:     Post prandial:  Diabetic Education: Not Completed Family history of diabetes: yes   ANXIETY Taking Buspar PRN.  Has taken 3 times in past 2 weeks. Mood status: stable Satisfied with current treatment?: yes Symptom severity: mild  Duration of current treatment : chronic Psychotherapy/counseling: none Depressed mood: no Anxious mood: no Anhedonia: no  Significant weight loss or gain: no Insomnia: occasional -- got a new mattress and is getting better sleep Fatigue: occasional Feelings of worthlessness or guilt: no Impaired concentration/indecisiveness: no Suicidal ideations: no Hopelessness: no Crying spells: no    05/10/2023    1:25 PM 01/06/2023    2:33 PM 10/16/2022    1:53 PM 08/17/2022    4:17 PM  GAD 7 : Generalized Anxiety Score  Nervous, Anxious, on Edge 0 1 0 0  Control/stop worrying 0 0 0 0  Worry too much - different things 0 0 0 0  Trouble relaxing 0 0 0 0  Restless 1 0 0 0  Easily annoyed or irritable 0 1 0 0  Afraid - awful might happen 0 0 0  0  Total GAD 7 Score 1 2 0 0  Anxiety Difficulty Not difficult at all Not difficult at all Not difficult at all Not difficult at all      05/10/2023    1:25 PM 01/06/2023    2:33 PM 10/16/2022    1:52 PM 08/17/2022    4:17 PM 05/15/2022    1:38 PM  Depression screen PHQ 2/9  Decreased Interest 0 0 0 0 0  Down, Depressed, Hopeless 0 0 0 0 0  PHQ - 2 Score 0 0 0 0 0  Altered sleeping 1 1 0 1 1  Tired, decreased energy 1 1 0 1 1  Change in appetite 0 0 0 0 0  Feeling bad or failure about yourself  0 0 0 0 0  Trouble concentrating 0 0 0 0 0  Moving slowly or fidgety/restless 0 0 0 0 0  Suicidal thoughts 0 0 0 0 0  PHQ-9 Score 2 2 0 2 2  Difficult doing work/chores Not difficult at all Not difficult at all Not difficult at all Not difficult at all Somewhat difficult    Relevant past medical, surgical, family and social history reviewed and updated as indicated. Interim medical history since our last visit reviewed. Allergies and medications reviewed and updated.  Review of Systems  Constitutional:  Negative for activity change, appetite change, diaphoresis, fatigue and fever.  Respiratory:  Negative for cough, chest tightness and shortness of breath.   Cardiovascular:  Negative for chest pain, palpitations and leg swelling.  Gastrointestinal: Negative.   Endocrine: Negative for polydipsia, polyphagia and polyuria.  Neurological:  Positive for dizziness. Negative for syncope, weakness, light-headedness, numbness and headaches.  Psychiatric/Behavioral: Negative.      Per HPI unless specifically indicated above     Objective:    BP 108/72   Pulse 79   Temp 98.4 F (36.9 C) (Oral)   Ht 5\' 5"  (1.651 m)   Wt 213 lb 3.2 oz (96.7 kg)   LMP  (LMP Unknown)   SpO2 96%   BMI 35.48 kg/m   Wt Readings from Last 3 Encounters:  05/10/23 213 lb 3.2 oz (96.7 kg)  01/06/23 215 lb 12.8 oz (97.9 kg)  11/12/22 216 lb 14.9 oz (98.4 kg)    Physical Exam Vitals and nursing note reviewed.   Constitutional:      General: She is awake. She is not in acute distress.    Appearance: She is well-developed and well-groomed. She is obese. She is not ill-appearing or toxic-appearing.  HENT:     Head: Normocephalic.     Right Ear: Hearing normal.     Left Ear: Hearing normal.  Eyes:     General: Lids are normal.  Right eye: No discharge.        Left eye: No discharge.     Extraocular Movements: Extraocular movements intact.     Conjunctiva/sclera: Conjunctivae normal.     Pupils: Pupils are equal, round, and reactive to light.  Neck:     Thyroid: No thyromegaly.     Vascular: No carotid bruit.  Cardiovascular:     Rate and Rhythm: Normal rate and regular rhythm.     Heart sounds: Normal heart sounds. No murmur heard.    No gallop.  Pulmonary:     Effort: Pulmonary effort is normal. No accessory muscle usage or respiratory distress.     Breath sounds: Normal breath sounds.  Abdominal:     General: Bowel sounds are normal.     Palpations: Abdomen is soft.  Musculoskeletal:     Cervical back: Normal range of motion and neck supple.     Right lower leg: No edema.     Left lower leg: No edema.  Lymphadenopathy:     Cervical: No cervical adenopathy.  Skin:    General: Skin is warm and dry.  Neurological:     Mental Status: She is alert and oriented to person, place, and time.     Cranial Nerves: Cranial nerves 2-12 are intact.     Motor: Motor function is intact.     Coordination: Coordination is intact.     Gait: Gait is intact.     Deep Tendon Reflexes: Reflexes are normal and symmetric.     Reflex Scores:      Brachioradialis reflexes are 2+ on the right side and 2+ on the left side.      Patellar reflexes are 2+ on the right side and 2+ on the left side. Psychiatric:        Attention and Perception: Attention normal.        Mood and Affect: Mood normal.        Speech: Speech normal.        Behavior: Behavior normal. Behavior is cooperative.        Thought  Content: Thought content normal.    Results for orders placed or performed during the hospital encounter of 11/12/22  CBC  Result Value Ref Range   WBC 7.6 4.0 - 10.5 K/uL   RBC 4.55 3.87 - 5.11 MIL/uL   Hemoglobin 13.5 12.0 - 15.0 g/dL   HCT 16.1 09.6 - 04.5 %   MCV 90.5 80.0 - 100.0 fL   MCH 29.7 26.0 - 34.0 pg   MCHC 32.8 30.0 - 36.0 g/dL   RDW 40.9 81.1 - 91.4 %   Platelets 206 150 - 400 K/uL   nRBC 0.0 0.0 - 0.2 %  Basic metabolic panel  Result Value Ref Range   Sodium 139 135 - 145 mmol/L   Potassium 4.3 3.5 - 5.1 mmol/L   Chloride 105 98 - 111 mmol/L   CO2 24 22 - 32 mmol/L   Glucose, Bld 94 70 - 99 mg/dL   BUN 17 8 - 23 mg/dL   Creatinine, Ser 7.82 0.44 - 1.00 mg/dL   Calcium 9.3 8.9 - 95.6 mg/dL   GFR, Estimated >21 >30 mL/min   Anion gap 10 5 - 15  Troponin I (High Sensitivity)  Result Value Ref Range   Troponin I (High Sensitivity) 2 <18 ng/L      Assessment & Plan:   Problem List Items Addressed This Visit       Cardiovascular and Mediastinum  Hypertension - Primary    Chronic, stable.  BP is well below goal in office today, recommend she check at home to ensure it is not getting too low.  Continue Lisinopril 10 MG daily at this time and adjust as needed.  Continue to monitor BP at home and document + focus on DASH diet.  LABS: CBC, CMP, TSH. Could consider reduction to discontinuation of Lisinopril next visits if remains under good control.  Return in 6 months.      Relevant Orders   Comprehensive metabolic panel   CBC with Differential/Platelet   TSH     Endocrine   IFG (impaired fasting glucose)    A1c 5.5% today, much improved.  Focus on diet and regular activity.      Relevant Orders   Bayer DCA Hb A1c Waived     Other   Dizziness    Acute, only while at work.  Suspect some dehydration or low sugars as workplace has no air conditioning, only fans.  Has short breaks only.  Recommend she use electrolyte drinks at work and ensure drinking  fluids every hour + having a snack on breaks.  Neuro exam reassuring.  No red flags.  Return if worsening or ongoing.  Labs today.      Elevated LDL cholesterol level    ASCVD 5%.  Discussed with patient and educated.  IF LDL >190 or ASCVD 7% or greater will start statin, at this time recheck lipid panel and continue diet focus.      Relevant Orders   Comprehensive metabolic panel   Lipid Panel w/o Chol/HDL Ratio   Obesity    BMI 35.48.  Recommended eating smaller high protein, low fat meals more frequently and exercising 30 mins a day 5 times a week with a goal of 10-15lb weight loss in the next 3 months. Patient voiced their understanding and motivation to adhere to these recommendations.       Situational anxiety    Ongoing, stable with occasional Buspar.  She has seen improvement with this.  Continue current medication regimen and adjust as needed.  Denies SI/HI.  Return to office in 6 months.        Follow up plan: Return in about 23 weeks (around 10/18/2023) for Annual physical after 10/17/23.

## 2023-05-10 NOTE — Assessment & Plan Note (Signed)
ASCVD 5%.  Discussed with patient and educated.  IF LDL >190 or ASCVD 7% or greater will start statin, at this time recheck lipid panel and continue diet focus.

## 2023-05-13 LAB — CBC WITH DIFFERENTIAL/PLATELET
Basophils Absolute: 0 10*3/uL (ref 0.0–0.2)
Basos: 1 %
EOS (ABSOLUTE): 0.2 10*3/uL (ref 0.0–0.4)
Eos: 2 %
Hematocrit: 40.9 % (ref 34.0–46.6)
Hemoglobin: 14.2 g/dL (ref 11.1–15.9)
Immature Grans (Abs): 0 10*3/uL (ref 0.0–0.1)
Immature Granulocytes: 0 %
Lymphocytes Absolute: 2.9 10*3/uL (ref 0.7–3.1)
Lymphs: 35 %
MCH: 30 pg (ref 26.6–33.0)
MCHC: 34.7 g/dL (ref 31.5–35.7)
MCV: 86 fL (ref 79–97)
Monocytes Absolute: 0.7 10*3/uL (ref 0.1–0.9)
Monocytes: 8 %
Neutrophils Absolute: 4.4 10*3/uL (ref 1.4–7.0)
Neutrophils: 54 %
Platelets: 218 10*3/uL (ref 150–450)
RBC: 4.74 x10E6/uL (ref 3.77–5.28)
RDW: 12.1 % (ref 11.7–15.4)
WBC: 8.2 10*3/uL (ref 3.4–10.8)

## 2023-05-13 LAB — COMPREHENSIVE METABOLIC PANEL
ALT: 13 IU/L (ref 0–32)
AST: 26 IU/L (ref 0–40)
Albumin: 4.2 g/dL (ref 3.9–4.9)
Alkaline Phosphatase: 57 IU/L (ref 44–121)
BUN/Creatinine Ratio: 16 (ref 12–28)
BUN: 14 mg/dL (ref 8–27)
Bilirubin Total: <0.2 mg/dL (ref 0.0–1.2)
CO2: 24 mmol/L (ref 20–29)
Calcium: 8.7 mg/dL (ref 8.7–10.3)
Chloride: 102 mmol/L (ref 96–106)
Creatinine, Ser: 0.89 mg/dL (ref 0.57–1.00)
Globulin, Total: 2.4 g/dL (ref 1.5–4.5)
Glucose: 102 mg/dL — ABNORMAL HIGH (ref 70–99)
Potassium: 4.7 mmol/L (ref 3.5–5.2)
Sodium: 140 mmol/L (ref 134–144)
Total Protein: 6.6 g/dL (ref 6.0–8.5)
eGFR: 74 mL/min/{1.73_m2} (ref >59)

## 2023-05-13 LAB — LIPID PANEL W/O CHOL/HDL RATIO
Cholesterol, Total: 172 mg/dL (ref 100–199)
HDL: 60 mg/dL (ref >39)
LDL Chol Calc (NIH): 80 mg/dL (ref 0–99)
Triglycerides: 189 mg/dL — ABNORMAL HIGH (ref 0–149)
VLDL Cholesterol Cal: 32 mg/dL (ref 5–40)

## 2023-05-13 LAB — TSH: TSH: 2.09 u[IU]/mL (ref 0.450–4.500)

## 2023-05-13 NOTE — Progress Notes (Signed)
Good morning, please let Stacie Bell know her labs have returned and overall remain stable.  Kidney function is normal.  Cholesterol labs are the best I have seen, your LDL went from 175 to now 80.  Whatever you are doing, keep doing it.  A1c shows no prediabetes or diabetes.  All great news!!  Any questions? Keep being amazing!!  Thank you for allowing me to participate in your care.  I appreciate you. Kindest regards, Aysha Livecchi

## 2023-05-20 DIAGNOSIS — D225 Melanocytic nevi of trunk: Secondary | ICD-10-CM | POA: Diagnosis not present

## 2023-05-20 DIAGNOSIS — D2261 Melanocytic nevi of right upper limb, including shoulder: Secondary | ICD-10-CM | POA: Diagnosis not present

## 2023-05-20 DIAGNOSIS — D0359 Melanoma in situ of other part of trunk: Secondary | ICD-10-CM | POA: Diagnosis not present

## 2023-05-20 DIAGNOSIS — D2272 Melanocytic nevi of left lower limb, including hip: Secondary | ICD-10-CM | POA: Diagnosis not present

## 2023-05-20 DIAGNOSIS — D485 Neoplasm of uncertain behavior of skin: Secondary | ICD-10-CM | POA: Diagnosis not present

## 2023-05-20 DIAGNOSIS — L570 Actinic keratosis: Secondary | ICD-10-CM | POA: Diagnosis not present

## 2023-05-20 DIAGNOSIS — D2262 Melanocytic nevi of left upper limb, including shoulder: Secondary | ICD-10-CM | POA: Diagnosis not present

## 2023-05-26 ENCOUNTER — Telehealth: Payer: Self-pay | Admitting: Nurse Practitioner

## 2023-05-26 NOTE — Telephone Encounter (Unsigned)
Copied from CRM [id number]. Topic: General - Other >> May 26, 2023 10:21 AM Macon Large wrote: Reason for CRM: Pt reports that she has not received her lab results in the mail as requested. Pt requests that the lab results be mailed to her. Pt address was verified.

## 2023-05-27 NOTE — Telephone Encounter (Signed)
Lab results printed and mailed to the patient as requested.

## 2023-06-01 ENCOUNTER — Other Ambulatory Visit: Payer: Self-pay | Admitting: Obstetrics and Gynecology

## 2023-06-01 DIAGNOSIS — Z1231 Encounter for screening mammogram for malignant neoplasm of breast: Secondary | ICD-10-CM

## 2023-06-02 DIAGNOSIS — D0359 Melanoma in situ of other part of trunk: Secondary | ICD-10-CM | POA: Diagnosis not present

## 2023-06-09 ENCOUNTER — Ambulatory Visit
Admission: RE | Admit: 2023-06-09 | Discharge: 2023-06-09 | Disposition: A | Discharge status: Home / Self Care | Payer: BC Managed Care – PPO | Source: Ambulatory Visit | Attending: Obstetrics and Gynecology | Admitting: Obstetrics and Gynecology

## 2023-06-09 DIAGNOSIS — Z1231 Encounter for screening mammogram for malignant neoplasm of breast: Secondary | ICD-10-CM | POA: Insufficient documentation

## 2023-06-13 ENCOUNTER — Encounter: Payer: Self-pay | Admitting: Obstetrics and Gynecology

## 2023-08-01 HISTORY — PX: RETINAL LASER PROCEDURE: SHX2339

## 2023-08-17 DIAGNOSIS — H33312 Horseshoe tear of retina without detachment, left eye: Secondary | ICD-10-CM | POA: Diagnosis not present

## 2023-08-18 DIAGNOSIS — H33312 Horseshoe tear of retina without detachment, left eye: Secondary | ICD-10-CM | POA: Diagnosis not present

## 2023-09-22 DIAGNOSIS — H33312 Horseshoe tear of retina without detachment, left eye: Secondary | ICD-10-CM | POA: Diagnosis not present

## 2023-10-17 NOTE — Patient Instructions (Signed)
 Be Involved in Caring For Your Health:  Taking Medications When medications are taken as directed, they can greatly improve your health. But if they are not taken as prescribed, they may not work. In some cases, not taking them correctly can be harmful. To help ensure your treatment remains effective and safe, understand your medications and how to take them. Bring your medications to each visit for review by your provider.  Your lab results, notes, and after visit summary will be available on My Chart. We strongly encourage you to use this feature. If lab results are abnormal the clinic will contact you with the appropriate steps. If the clinic does not contact you assume the results are satisfactory. You can always view your results on My Chart. If you have questions regarding your health or results, please contact the clinic during office hours. You can also ask questions on My Chart.  We at [facility] are grateful that you chose [place] to provide your care. We strive to provide evidence-based and compassionate care and are always looking for feedback. If you get a survey from the clinic please complete this so we can hear your opinions.  Heart-Healthy Eating Plan Many factors influence your heart health, including eating and exercise habits. Heart health is also called coronary health. Coronary risk increases with abnormal blood fat (lipid) levels. A heart-healthy eating plan includes limiting unhealthy fats, increasing healthy fats, limiting salt (sodium) intake, and making other diet and lifestyle changes. What is my plan? Your health care provider may recommend that: You limit your fat intake to _________% or less of your total calories each day. You limit your saturated fat intake to _________% or less of your total calories each day. You limit the amount of cholesterol in your diet to less than _________ mg per day. You limit the amount of sodium in your diet to less than _________  mg per day. What are tips for following this plan? Cooking Cook foods using methods other than frying. Baking, boiling, grilling, and broiling are all good options. Other ways to reduce fat include: Removing the skin from poultry. Removing all visible fats from meats. Steaming vegetables in water or broth. Meal planning  At meals, imagine dividing your plate into fourths: Fill one-half of your plate with vegetables and green salads. Fill one-fourth of your plate with whole grains. Fill one-fourth of your plate with lean protein foods. Eat 2-4 cups of vegetables per day. One cup of vegetables equals 1 cup (91 g) broccoli or cauliflower florets, 2 medium carrots, 1 large bell pepper, 1 large sweet potato, 1 large tomato, 1 medium white potato, 2 cups (150 g) raw leafy greens. Eat 1-2 cups of fruit per day. One cup of fruit equals 1 small apple, 1 large banana, 1 cup (237 g) mixed fruit, 1 large orange,  cup (82 g) dried fruit, 1 cup (240 mL) 100% fruit juice. Eat more foods that contain soluble fiber. Examples include apples, broccoli, carrots, beans, peas, and barley. Aim to get 25-30 g of fiber per day. Increase your consumption of legumes, nuts, and seeds to 4-5 servings per week. One serving of dried beans or legumes equals  cup (90 g) cooked, 1 serving of nuts is  oz (12 almonds, 24 pistachios, or 7 walnut halves), and 1 serving of seeds equals  oz (8 g). Fats Choose healthy fats more often. Choose monounsaturated and polyunsaturated fats, such as olive and canola oils, avocado oil, flaxseeds, walnuts, almonds, and seeds. Eat  more omega-3 fats. Choose salmon, mackerel, sardines, tuna, flaxseed oil, and ground flaxseeds. Aim to eat fish at least 2 times each week. Check food labels carefully to identify foods with trans fats or high amounts of saturated fat. Limit saturated fats. These are found in animal products, such as meats, butter, and cream. Plant sources of saturated fats  include palm oil, palm kernel oil, and coconut oil. Avoid foods with partially hydrogenated oils in them. These contain trans fats. Examples are stick margarine, some tub margarines, cookies, crackers, and other baked goods. Avoid fried foods. General information Eat more home-cooked food and less restaurant, buffet, and fast food. Limit or avoid alcohol. Limit foods that are high in added sugar and simple starches such as foods made using white refined flour (white breads, pastries, sweets). Lose weight if you are overweight. Losing just 5-10% of your body weight can help your overall health and prevent diseases such as diabetes and heart disease. Monitor your sodium intake, especially if you have high blood pressure. Talk with your health care provider about your sodium intake. Try to incorporate more vegetarian meals weekly. What foods should I eat? Fruits All fresh, canned (in natural juice), or frozen fruits. Vegetables Fresh or frozen vegetables (raw, steamed, roasted, or grilled). Green salads. Grains Most grains. Choose whole wheat and whole grains most of the time. Rice and pasta, including brown rice and pastas made with whole wheat. Meats and other proteins Lean, well-trimmed beef, veal, pork, and lamb. Chicken and [place] without skin. All fish and shellfish. Wild duck, rabbit, pheasant, and venison. Egg whites or low-cholesterol egg substitutes. Dried beans, peas, lentils, and tofu. Seeds and most nuts. Dairy Low-fat or nonfat cheeses, including ricotta and mozzarella. Skim or 1% milk (liquid, powdered, or evaporated). Buttermilk made with low-fat milk. Nonfat or low-fat yogurt. Fats and oils Non-hydrogenated (trans-free) margarines. Vegetable oils, including soybean, sesame, sunflower, olive, avocado, peanut, safflower, corn, canola, and cottonseed. Salad dressings or mayonnaise made with a vegetable oil. Beverages Water (mineral or sparkling). Coffee and tea. Unsweetened ice  tea. Diet beverages. Sweets and desserts Sherbet, gelatin, and fruit ice. Small amounts of dark chocolate. Limit all sweets and desserts. Seasonings and condiments All seasonings and condiments. The items listed above may not be a complete list of foods and beverages you can eat. Contact a dietitian for more options. What foods should I avoid? Fruits Canned fruit in heavy syrup. Fruit in cream or butter sauce. Fried fruit. Limit coconut. Vegetables Vegetables cooked in cheese, cream, or butter sauce. Fried vegetables. Grains Breads made with saturated or trans fats, oils, or whole milk. Croissants. Sweet rolls. Donuts. High-fat crackers, such as cheese crackers and chips. Meats and other proteins Fatty meats, such as hot dogs, ribs, sausage, bacon, rib-eye roast or steak. High-fat deli meats, such as salami and bologna. Caviar. Domestic duck and goose. Organ meats, such as liver. Dairy Cream, sour cream, cream cheese, and creamed cottage cheese. Whole-milk cheeses. Whole or 2% milk (liquid, evaporated, or condensed). Whole buttermilk. Cream sauce or high-fat cheese sauce. Whole-milk yogurt. Fats and oils Meat fat, or shortening. Cocoa butter, hydrogenated oils, palm oil, coconut oil, palm kernel oil. Solid fats and shortenings, including bacon fat, salt pork, lard, and butter. Nondairy cream substitutes. Salad dressings with cheese or sour cream. Beverages Regular sodas and any drinks with added sugar. Sweets and desserts Frosting. Pudding. Cookies. Cakes. Pies. Milk chocolate or white chocolate. Buttered syrups. Full-fat ice cream or ice cream drinks. The items listed above may  not be a complete list of foods and beverages to avoid. Contact a dietitian for more information. Summary Heart-healthy meal planning includes limiting unhealthy fats, increasing healthy fats, limiting salt (sodium) intake and making other diet and lifestyle changes. Lose weight if you are overweight. Losing just  5-10% of your body weight can help your overall health and prevent diseases such as diabetes and heart disease. Focus on eating a balance of foods, including fruits and vegetables, low-fat or nonfat dairy, lean protein, nuts and legumes, whole grains, and heart-healthy oils and fats. This information is not intended to replace advice given to you by your health care provider. Make sure you discuss any questions you have with your health care provider. Document Revised: 09/22/2021 Document Reviewed: 09/22/2021 Elsevier Patient Education  2024 [employer].

## 2023-10-19 ENCOUNTER — Encounter: Payer: Self-pay | Admitting: Nurse Practitioner

## 2023-10-19 ENCOUNTER — Ambulatory Visit: Payer: Self-pay | Admitting: Nurse Practitioner

## 2023-10-19 VITALS — BP 111/75 | HR 80 | Temp 97.7°F | Ht 63.8 in | Wt 226.2 lb

## 2023-10-19 DIAGNOSIS — I10 Essential (primary) hypertension: Secondary | ICD-10-CM

## 2023-10-19 DIAGNOSIS — Z2821 Immunization not carried out because of patient refusal: Secondary | ICD-10-CM

## 2023-10-19 DIAGNOSIS — F418 Other specified anxiety disorders: Secondary | ICD-10-CM

## 2023-10-19 DIAGNOSIS — E559 Vitamin D deficiency, unspecified: Secondary | ICD-10-CM

## 2023-10-19 DIAGNOSIS — K219 Gastro-esophageal reflux disease without esophagitis: Secondary | ICD-10-CM

## 2023-10-19 DIAGNOSIS — Z6835 Body mass index (BMI) 35.0-35.9, adult: Secondary | ICD-10-CM

## 2023-10-19 DIAGNOSIS — R7301 Impaired fasting glucose: Secondary | ICD-10-CM | POA: Diagnosis not present

## 2023-10-19 DIAGNOSIS — E66812 Obesity, class 2: Secondary | ICD-10-CM

## 2023-10-19 DIAGNOSIS — Z0000 Encounter for general adult medical examination without abnormal findings: Secondary | ICD-10-CM | POA: Diagnosis not present

## 2023-10-19 DIAGNOSIS — E7800 Pure hypercholesterolemia, unspecified: Secondary | ICD-10-CM

## 2023-10-19 DIAGNOSIS — E6609 Other obesity due to excess calories: Secondary | ICD-10-CM

## 2023-10-19 LAB — MICROSCOPIC EXAMINATION
Crystal Type: NONE SEEN
Crystals: NONE SEEN
Renal Epithel, UA: NONE SEEN /[HPF]
Trichomonas, UA: NONE SEEN
Yeast, UA: NONE SEEN

## 2023-10-19 LAB — URINALYSIS, ROUTINE W REFLEX MICROSCOPIC
Bilirubin, UA: NEGATIVE
Glucose, UA: NEGATIVE
Ketones, UA: NEGATIVE
Leukocytes,UA: NEGATIVE
Nitrite, UA: NEGATIVE
Protein,UA: NEGATIVE
Specific Gravity, UA: >1.03 — ABNORMAL HIGH (ref 1.005–1.030)
Urobilinogen, Ur: 0.2 mg/dL (ref 0.2–1.0)
pH, UA: 5.5 (ref 5.0–7.5)

## 2023-10-19 MED ORDER — LISINOPRIL 10 MG PO TABS: 10.0000 mg | ORAL_TABLET | Freq: Every day | ORAL | 4 refills | Status: AC

## 2023-10-19 NOTE — Assessment & Plan Note (Signed)
 ASCVD 3.5%.  Discussed with patient and educated.  IF LDL >190 or ASCVD 7% or greater will start statin, at this time recheck lipid panel and continue diet focus.

## 2023-10-19 NOTE — Assessment & Plan Note (Signed)
 Chronic, stable.  BP is well below goal in office today, recommend she check at home to ensure it is not getting too low.  Continue Lisinopril 10 MG daily at this time and adjust as needed.  Continue to monitor BP at home and document + focus on DASH diet.  LABS: CBC, CMP, TSH, urine check. Could consider reduction to discontinuation of Lisinopril next visits if remains under good control.

## 2023-10-19 NOTE — Assessment & Plan Note (Signed)
 Ongoing, rarely uses Buspar.  She has seen improvement with this.  Continue current medication regimen and adjust as needed.  Denies SI/HI.  Refills as needed.

## 2023-10-19 NOTE — Assessment & Plan Note (Signed)
Ongoing, last labs.  Continue Vitamin D3 2000 units daily.  Recheck level today. 

## 2023-10-19 NOTE — Assessment & Plan Note (Signed)
 A1c 5.5% last visit, much improved.  Focus on diet and regular activity.

## 2023-10-19 NOTE — Assessment & Plan Note (Signed)
 Refuses Flu, Covid, and Shingrix vaccines.

## 2023-10-19 NOTE — Assessment & Plan Note (Signed)
 Chronic, stable.  Continue Protonix PRN, which has offered benefit to her GERD symptoms.  Mag level annually.

## 2023-10-19 NOTE — Assessment & Plan Note (Signed)
 BMI 39.07.  Recommended eating smaller high protein, low fat meals more frequently and exercising 30 mins a day 5 times a week with a goal of 10-15lb weight loss in the next 3 months. Patient voiced their understanding and motivation to adhere to these recommendations.
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
## 2023-10-20 LAB — COMPREHENSIVE METABOLIC PANEL
ALT: 22 [IU]/L (ref 0–32)
AST: 18 [IU]/L (ref 0–40)
Albumin: 4.5 g/dL (ref 3.9–4.9)
Alkaline Phosphatase: 66 [IU]/L (ref 44–121)
BUN/Creatinine Ratio: 19 (ref 12–28)
BUN: 12 mg/dL (ref 8–27)
Bilirubin Total: 0.2 mg/dL (ref 0.0–1.2)
CO2: 22 mmol/L (ref 20–29)
Calcium: 9.8 mg/dL (ref 8.7–10.3)
Chloride: 104 mmol/L (ref 96–106)
Creatinine, Ser: 0.64 mg/dL (ref 0.57–1.00)
Globulin, Total: 2.2 g/dL (ref 1.5–4.5)
Glucose: 88 mg/dL (ref 70–99)
Potassium: 4.3 mmol/L (ref 3.5–5.2)
Sodium: 140 mmol/L (ref 134–144)
Total Protein: 6.7 g/dL (ref 6.0–8.5)
eGFR: 100 mL/min/{1.73_m2} (ref >59)

## 2023-10-20 LAB — LIPID PANEL W/O CHOL/HDL RATIO
Cholesterol, Total: 247 mg/dL — ABNORMAL HIGH (ref 100–199)
HDL: 45 mg/dL (ref >39)
LDL Chol Calc (NIH): 166 mg/dL — ABNORMAL HIGH (ref 0–99)
Triglycerides: 195 mg/dL — ABNORMAL HIGH (ref 0–149)
VLDL Cholesterol Cal: 36 mg/dL (ref 5–40)

## 2023-10-20 LAB — CBC WITH DIFFERENTIAL/PLATELET
Basophils Absolute: 0 10*3/uL (ref 0.0–0.2)
Basos: 0 %
EOS (ABSOLUTE): 0.3 10*3/uL (ref 0.0–0.4)
Eos: 3 %
Hematocrit: 40.8 % (ref 34.0–46.6)
Hemoglobin: 14 g/dL (ref 11.1–15.9)
Immature Grans (Abs): 0 10*3/uL (ref 0.0–0.1)
Immature Granulocytes: 0 %
Lymphocytes Absolute: 2.6 10*3/uL (ref 0.7–3.1)
Lymphs: 27 %
MCH: 30.9 pg (ref 26.6–33.0)
MCHC: 34.3 g/dL (ref 31.5–35.7)
MCV: 90 fL (ref 79–97)
Monocytes Absolute: 0.8 10*3/uL (ref 0.1–0.9)
Monocytes: 9 %
Neutrophils Absolute: 5.6 10*3/uL (ref 1.4–7.0)
Neutrophils: 61 %
Platelets: 223 10*3/uL (ref 150–450)
RBC: 4.53 x10E6/uL (ref 3.77–5.28)
RDW: 12.4 % (ref 11.7–15.4)
WBC: 9.4 10*3/uL (ref 3.4–10.8)

## 2023-10-20 LAB — HEMOGLOBIN A1C
Est. average glucose Bld gHb Est-mCnc: 120 mg/dL
Hgb A1c MFr Bld: 5.8 % — ABNORMAL HIGH (ref 4.8–5.6)

## 2023-10-20 LAB — MAGNESIUM: Magnesium: 2.2 mg/dL (ref 1.6–2.3)

## 2023-10-20 LAB — TSH: TSH: 2.21 u[IU]/mL (ref 0.450–4.500)

## 2023-10-20 LAB — VITAMIN D 25 HYDROXY (VIT D DEFICIENCY, FRACTURES): Vit D, 25-Hydroxy: 23.1 ng/mL — ABNORMAL LOW (ref 30.0–100.0)

## 2023-10-20 NOTE — Progress Notes (Signed)
 Good morning crew, please let Stacie Bell know her labs have returned: - Lipid panel is showing elevations from previous check, I would continue taking fish oil daily and focusing on healthy diet and regular activity.  We will recheck fasting at next visit. - A1c remains in prediabetic range at 5.8%, slight trend up from September check. For this also focus on healthy diet changes and regular activity. - Vitamin D a little low still, please ensure to take Vitamin D3 2000 units daily for bone health. - Remainder of labs all stable.  Any questions? Keep being stellar!!  Thank you for allowing me to participate in your care.  I appreciate you. Kindest regards, Javanni Maring

## 2023-11-12 ENCOUNTER — Other Ambulatory Visit: Payer: Self-pay | Admitting: Nurse Practitioner

## 2023-11-12 NOTE — Telephone Encounter (Signed)
 Requested Prescriptions  Pending Prescriptions Disp Refills   busPIRone (BUSPAR) 5 MG tablet [Pharmacy Med Name: BUSPIRONE HCL 5 MG TABLET] 180 tablet 1    Sig: TAKE 1 TABLET BY MOUTH TWICE A DAY AS NEEDED     Psychiatry: Anxiolytics/Hypnotics - Non-controlled Passed - 11/12/2023  2:03 PM      Passed - Valid encounter within last 12 months    Recent Outpatient Visits           6 months ago Primary hypertension   [place] [facility] Young, Massena T, NP   10 months ago Sinus pressure   [place] [facility] [place], Corrie Dandy T, NP   1 year ago Primary hypertension   [facility] [place], Corrie Dandy T, NP   1 year ago Bacterial vaginosis   [place] [facility] Larae Grooms, NP   1 year ago Bacterial vaginosis   [place] [facility] [place], Dorie Rank, NP       Future Appointments             In 11 months Cann[place], Dorie Rank, NP [facility]

## 2023-12-06 ENCOUNTER — Telehealth: Payer: Self-pay

## 2023-12-06 NOTE — Progress Notes (Unsigned)
 Stacie Skiff, NP   No chief complaint on file.   HPI:      [name] is a 63 y.o. [phone] whose LMP was No LMP recorded (lmp unknown). Patient is postmenopausal., presents today for breast mass    Patient Active Problem List   Diagnosis Date Noted   Flu vaccine refused 10/19/2023   Vitamin D deficiency 05/10/2022   Elevated LDL cholesterol level 04/10/2020   Obesity 04/10/2020   Acid reflux 10/04/2019   Situational anxiety 09/06/2019   Hypertension 09/03/2019   IFG (impaired fasting glucose) 09/03/2019   Allergic rhinitis 03/04/2016    Past Surgical History:  Procedure Laterality Date   arm muscle repair     BREAST BIOPSY Right    BENIGN BREAST PARENCHYMA WITH FIBROADENOMA/FIBROADENOMATOID CHANGE AND ASSOCIATED CALCIFICATIONS of the RIGHT breast, upper outer quadrant (ribbon clip   LAPAROSCOPIC APPENDECTOMY N/A 05/25/2021   Procedure: APPENDECTOMY LAPAROSCOPIC;  Surgeon: Duanne Guess, MD;  Location: [facility] ORS;  Service: General;  Laterality: N/A;   RETINAL LASER PROCEDURE Left 08/2023   TONSILLECTOMY      Family History  Problem Relation Age of Onset   Diabetes Mother    Heart disease Mother    Hypertension Mother    Kidney failure Mother    Clotting disorder Father    Bladder Cancer Father 37   Hypertension Father    Healthy Sister    Hypertension Brother    Healthy Brother    Breast cancer Maternal Aunt 60   Colon cancer Maternal Grandmother 60    Social History   Socioeconomic History   Marital status: Divorced    Spouse name: Not on file   Number of children: Not on file   Years of education: Not on file   Highest education level: Not on file  Occupational History   Not on file  Tobacco Use   Smoking status: Never   Smokeless tobacco: Never  Vaping Use   Vaping status: Never Used  Substance and Sexual Activity   Alcohol use: Not Currently    Alcohol/week: 2.0 standard drinks of alcohol    Types: 2 Standard drinks or  equivalent per week    Comment: socially   Drug use: No   Sexual activity: Not Currently    Partners: Male    Birth control/protection: Post-menopausal  Other Topics Concern   Not on file  Social History Narrative   Not on file   Social Drivers of Health   Financial Resource Strain: Not on file  Food Insecurity: Not on file  Transportation Needs: Not on file  Physical Activity: Not on file  Stress: Not on file  Social Connections: Not on file  Intimate Partner Violence: Not on file    Outpatient Medications Prior to Visit  Medication Sig Dispense Refill   acetaminophen (TYLENOL) 500 MG tablet Take 2 tablets (1,000 mg total) by mouth every 6 (six) hours. 30 tablet 0   aspirin EC 81 MG tablet Take 81 mg by mouth daily.     BIOTIN PO Take by mouth daily.     busPIRone (BUSPAR) 5 MG tablet TAKE 1 TABLET BY MOUTH TWICE A DAY AS NEEDED 180 tablet 1   clotrimazole-betamethasone (LOTRISONE) cream Apply externally BID prn sx up to 2 wks 15 g 0   Crisaborole (EUCRISA) 2 % OINT Apply a thin film to affected area(s) 2 times daily. 60 g 3   Cyanocobalamin (VITAMIN B 12 PO) Take by mouth daily.  lisinopril (ZESTRIL) 10 MG tablet Take 1 tablet (10 mg total) by mouth daily. 90 tablet 4   Multiple Vitamin (MULTIVITAMIN) capsule Take 1 capsule by mouth daily.     nystatin ointment (MYCOSTATIN) Apply 1 application. topically 2 (two) times daily. 30 g 0   Omega-3 Fatty Acids (FISH OIL PO) Take by mouth daily.     pantoprazole (PROTONIX) 40 MG tablet TAKE 1 TABLET BY MOUTH DAILY BEFORE BREAKFAST (Patient taking differently: 40 mg daily as needed.) 90 tablet 1   triamcinolone cream (KENALOG) 0.1 % Apply 1 application topically 2 (two) times daily. 30 g 0   No facility-administered medications prior to visit.      ROS:  Review of Systems BREAST: No symptoms   OBJECTIVE:   Vitals:  LMP  (LMP Unknown)   Physical Exam  Results: No results found for this or any previous visit (from  the past 24 hours).   Assessment/Plan: No diagnosis found.    No orders of the defined types were placed in this encounter.     No follow-ups on file.  Colleen Donahoe B. Kilie Rund, PA-C 12/06/2023 5:08 PM

## 2023-12-06 NOTE — Telephone Encounter (Signed)
 Called and spoke with patient. She is scheduled for 4/8 with [facility] for breast exam

## 2023-12-06 NOTE — Telephone Encounter (Signed)
 Pt calling triage, noticed lump on breast. Says when pressing on it feels like it busted? Please call pt to schedule an appt for a breast exam. She's also over due for annual exam.

## 2023-12-07 ENCOUNTER — Ambulatory Visit: Admitting: Obstetrics and Gynecology

## 2023-12-07 ENCOUNTER — Encounter: Payer: Self-pay | Admitting: Obstetrics and Gynecology

## 2023-12-07 VITALS — BP 113/71 | HR 77 | Ht 65.0 in | Wt 220.0 lb

## 2023-12-07 DIAGNOSIS — N644 Mastodynia: Secondary | ICD-10-CM | POA: Diagnosis not present

## 2023-12-07 DIAGNOSIS — N6321 Unspecified lump in the left breast, upper outer quadrant: Secondary | ICD-10-CM

## 2023-12-07 NOTE — Patient Instructions (Signed)
 I value your feedback and you entrusting [place] with your care. If you get a [place] patient survey, I would appreciate you taking the time to let us know about your experience today. Thank you! ? ? ?

## 2023-12-13 DIAGNOSIS — L570 Actinic keratosis: Secondary | ICD-10-CM | POA: Diagnosis not present

## 2023-12-13 DIAGNOSIS — D485 Neoplasm of uncertain behavior of skin: Secondary | ICD-10-CM | POA: Diagnosis not present

## 2023-12-13 DIAGNOSIS — D2272 Melanocytic nevi of left lower limb, including hip: Secondary | ICD-10-CM | POA: Diagnosis not present

## 2023-12-13 DIAGNOSIS — L820 Inflamed seborrheic keratosis: Secondary | ICD-10-CM | POA: Diagnosis not present

## 2023-12-13 DIAGNOSIS — D2261 Melanocytic nevi of right upper limb, including shoulder: Secondary | ICD-10-CM | POA: Diagnosis not present

## 2023-12-13 DIAGNOSIS — D225 Melanocytic nevi of trunk: Secondary | ICD-10-CM | POA: Diagnosis not present

## 2023-12-13 DIAGNOSIS — L538 Other specified erythematous conditions: Secondary | ICD-10-CM | POA: Diagnosis not present

## 2023-12-13 DIAGNOSIS — D2262 Melanocytic nevi of left upper limb, including shoulder: Secondary | ICD-10-CM | POA: Diagnosis not present

## 2023-12-13 DIAGNOSIS — L2989 Other pruritus: Secondary | ICD-10-CM | POA: Diagnosis not present

## 2023-12-13 DIAGNOSIS — D0461 Carcinoma in situ of skin of right upper limb, including shoulder: Secondary | ICD-10-CM | POA: Diagnosis not present

## 2023-12-16 ENCOUNTER — Encounter: Payer: Self-pay | Admitting: Obstetrics and Gynecology

## 2023-12-16 ENCOUNTER — Ambulatory Visit
Admission: RE | Admit: 2023-12-16 | Discharge: 2023-12-16 | Disposition: A | Discharge status: Home / Self Care | Source: Ambulatory Visit | Attending: Obstetrics and Gynecology | Admitting: Obstetrics and Gynecology

## 2023-12-16 DIAGNOSIS — N6321 Unspecified lump in the left breast, upper outer quadrant: Secondary | ICD-10-CM

## 2023-12-16 DIAGNOSIS — N6324 Unspecified lump in the left breast, lower inner quadrant: Secondary | ICD-10-CM | POA: Diagnosis not present

## 2023-12-16 DIAGNOSIS — R92322 Mammographic fibroglandular density, left breast: Secondary | ICD-10-CM | POA: Diagnosis not present

## 2023-12-16 DIAGNOSIS — N644 Mastodynia: Secondary | ICD-10-CM | POA: Diagnosis not present

## 2023-12-16 DIAGNOSIS — N6325 Unspecified lump in the left breast, overlapping quadrants: Secondary | ICD-10-CM | POA: Diagnosis not present

## 2023-12-29 DIAGNOSIS — H33312 Horseshoe tear of retina without detachment, left eye: Secondary | ICD-10-CM | POA: Diagnosis not present

## 2023-12-30 ENCOUNTER — Other Ambulatory Visit

## 2023-12-30 ENCOUNTER — Encounter

## 2024-02-07 DIAGNOSIS — D0461 Carcinoma in situ of skin of right upper limb, including shoulder: Secondary | ICD-10-CM | POA: Diagnosis not present

## 2024-05-10 ENCOUNTER — Other Ambulatory Visit: Payer: Self-pay | Admitting: Nurse Practitioner

## 2024-05-11 NOTE — Telephone Encounter (Signed)
 Requested Prescriptions  Pending Prescriptions Disp Refills   busPIRone  (BUSPAR ) 5 MG tablet [Pharmacy Med Name: BUSPIRONE  HCL 5 MG TABLET] 180 tablet 1    Sig: TAKE 1 TABLET BY MOUTH TWICE A DAY AS NEEDED     Psychiatry: Anxiolytics/Hypnotics - Non-controlled Passed - 05/11/2024 10:55 AM      Passed - Valid encounter within last 12 months    Recent Outpatient Visits           6 months ago Primary hypertension   [place] [facility] [place], Melanie DASEN, NP       Future Appointments             In 5 months [place], Jolene T, NP  [facility], [address]

## 2024-06-08 ENCOUNTER — Encounter: Payer: Self-pay | Admitting: Obstetrics and Gynecology

## 2024-06-08 ENCOUNTER — Other Ambulatory Visit: Payer: Self-pay | Admitting: Obstetrics and Gynecology

## 2024-06-08 DIAGNOSIS — Z1231 Encounter for screening mammogram for malignant neoplasm of breast: Secondary | ICD-10-CM

## 2024-06-26 DIAGNOSIS — D2272 Melanocytic nevi of left lower limb, including hip: Secondary | ICD-10-CM | POA: Diagnosis not present

## 2024-06-26 DIAGNOSIS — D2262 Melanocytic nevi of left upper limb, including shoulder: Secondary | ICD-10-CM | POA: Diagnosis not present

## 2024-06-26 DIAGNOSIS — D2261 Melanocytic nevi of right upper limb, including shoulder: Secondary | ICD-10-CM | POA: Diagnosis not present

## 2024-06-26 DIAGNOSIS — D225 Melanocytic nevi of trunk: Secondary | ICD-10-CM | POA: Diagnosis not present

## 2024-06-26 DIAGNOSIS — L570 Actinic keratosis: Secondary | ICD-10-CM | POA: Diagnosis not present

## 2024-07-07 ENCOUNTER — Ambulatory Visit
Admission: RE | Admit: 2024-07-07 | Discharge: 2024-07-07 | Disposition: A | Discharge status: Home / Self Care | Source: Ambulatory Visit | Attending: Obstetrics and Gynecology | Admitting: Obstetrics and Gynecology

## 2024-07-07 DIAGNOSIS — Z1231 Encounter for screening mammogram for malignant neoplasm of breast: Secondary | ICD-10-CM | POA: Diagnosis not present

## 2024-07-11 ENCOUNTER — Ambulatory Visit: Payer: Self-pay | Admitting: Obstetrics and Gynecology
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
## 2024-07-20 ENCOUNTER — Encounter: Payer: Self-pay | Admitting: Obstetrics and Gynecology

## 2024-07-20 ENCOUNTER — Ambulatory Visit (INDEPENDENT_AMBULATORY_CARE_PROVIDER_SITE_OTHER): Admitting: Obstetrics and Gynecology

## 2024-07-20 ENCOUNTER — Telehealth: Payer: Self-pay | Admitting: Obstetrics and Gynecology

## 2024-07-20 VITALS — BP 115/72 | HR 75 | Ht 65.0 in | Wt 215.0 lb

## 2024-07-20 DIAGNOSIS — N761 Subacute and chronic vaginitis: Secondary | ICD-10-CM | POA: Diagnosis not present

## 2024-07-20 DIAGNOSIS — Z01419 Encounter for gynecological examination (general) (routine) without abnormal findings: Secondary | ICD-10-CM

## 2024-07-20 DIAGNOSIS — Z1231 Encounter for screening mammogram for malignant neoplasm of breast: Secondary | ICD-10-CM

## 2024-07-20 DIAGNOSIS — Z01411 Encounter for gynecological examination (general) (routine) with abnormal findings: Secondary | ICD-10-CM

## 2024-07-20 DIAGNOSIS — R1032 Left lower quadrant pain: Secondary | ICD-10-CM

## 2024-07-20 DIAGNOSIS — N760 Acute vaginitis: Secondary | ICD-10-CM

## 2024-07-20 MED ORDER — CLOTRIMAZOLE-BETAMETHASONE 1-0.05 % EX CREA: TOPICAL_CREAM | CUTANEOUS | 1 refills | Status: AC

## 2024-07-20 NOTE — Patient Instructions (Signed)
 I value your feedback and you entrusting [place] with your care. If you get a [place] patient survey, I would appreciate you taking the time to let us know about your experience today. Thank you! ? ? ?

## 2024-07-20 NOTE — Telephone Encounter (Signed)
 Reached out to pt about GYN US  that needed to be scheduled.  Val worked her in on 07/25/2024 at 10:45.  I went ahead and scheduled the appt.  Left message for pt to call back to confirm the appt.

## 2024-07-25 ENCOUNTER — Ambulatory Visit (INDEPENDENT_AMBULATORY_CARE_PROVIDER_SITE_OTHER)

## 2024-07-25 DIAGNOSIS — R1032 Left lower quadrant pain: Secondary | ICD-10-CM | POA: Diagnosis not present

## 2024-07-31 ENCOUNTER — Telehealth: Payer: Self-pay | Admitting: Obstetrics and Gynecology

## 2024-07-31 NOTE — Telephone Encounter (Signed)
 Pt aware of Gyn u/s results with slightly larger LTO cyst from 2023. Per guidelines, u/s f/u in 1 yr. Pt has occas LLQ pains that tend to flare, then resolve. If sx worsen, can refer to MD to discuss surg. Pt understands.

## 2024-08-11 NOTE — Telephone Encounter (Signed)
 Pt had the Gyn US  on 07/25/2024.

## 2024-10-23 ENCOUNTER — Encounter: Payer: BC Managed Care – PPO | Admitting: Nurse Practitioner
